# Patient Record
Sex: Female | Born: 1990 | Race: Black or African American | Hispanic: No | Marital: Single | Smoking: Current every day smoker
Health system: Southern US, Community
[De-identification: ages and names within clinical notes are randomized; demographics above are authoritative.]

## PROBLEM LIST (undated history)

## (undated) ENCOUNTER — Inpatient Hospital Stay (HOSPITAL_COMMUNITY): Payer: Self-pay

## (undated) DIAGNOSIS — IMO0001 Reserved for inherently not codable concepts without codable children: Secondary | ICD-10-CM

## (undated) DIAGNOSIS — O039 Complete or unspecified spontaneous abortion without complication: Secondary | ICD-10-CM

## (undated) DIAGNOSIS — B9689 Other specified bacterial agents as the cause of diseases classified elsewhere: Secondary | ICD-10-CM

## (undated) DIAGNOSIS — Z87442 Personal history of urinary calculi: Secondary | ICD-10-CM

## (undated) DIAGNOSIS — N760 Acute vaginitis: Secondary | ICD-10-CM

## (undated) DIAGNOSIS — B999 Unspecified infectious disease: Secondary | ICD-10-CM

## (undated) DIAGNOSIS — Z789 Other specified health status: Secondary | ICD-10-CM

## (undated) HISTORY — DX: Other specified health status: Z78.9

## (undated) HISTORY — PX: NO PAST SURGERIES: SHX2092

---
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
## 2009-02-07 ENCOUNTER — Emergency Department (HOSPITAL_COMMUNITY): Admission: EM | Admit: 2009-02-07 | Discharge: 2009-02-07 | Payer: Self-pay | Admitting: Emergency Medicine

## 2009-03-05 ENCOUNTER — Emergency Department (HOSPITAL_COMMUNITY): Admission: EM | Admit: 2009-03-05 | Discharge: 2009-03-05 | Payer: Self-pay | Admitting: Emergency Medicine

## 2009-03-07 ENCOUNTER — Emergency Department (HOSPITAL_COMMUNITY): Admission: EM | Admit: 2009-03-07 | Discharge: 2009-03-07 | Payer: Self-pay | Admitting: Emergency Medicine

## 2009-04-07 ENCOUNTER — Ambulatory Visit: Payer: Self-pay | Admitting: Family Medicine

## 2009-04-07 DIAGNOSIS — F17200 Nicotine dependence, unspecified, uncomplicated: Secondary | ICD-10-CM | POA: Insufficient documentation

## 2009-04-12 ENCOUNTER — Ambulatory Visit: Payer: Self-pay | Admitting: Family Medicine

## 2009-04-20 ENCOUNTER — Ambulatory Visit: Payer: Self-pay | Admitting: Family Medicine

## 2009-04-20 DIAGNOSIS — B373 Candidiasis of vulva and vagina: Secondary | ICD-10-CM

## 2009-04-20 DIAGNOSIS — B3731 Acute candidiasis of vulva and vagina: Secondary | ICD-10-CM | POA: Insufficient documentation

## 2009-04-20 DIAGNOSIS — N898 Other specified noninflammatory disorders of vagina: Secondary | ICD-10-CM | POA: Insufficient documentation

## 2009-04-20 LAB — CONVERTED CEMR LAB: Whiff Test: NEGATIVE

## 2009-04-26 ENCOUNTER — Telehealth: Payer: Self-pay | Admitting: Family Medicine

## 2009-07-28 ENCOUNTER — Telehealth: Payer: Self-pay | Admitting: Family Medicine

## 2009-08-08 ENCOUNTER — Ambulatory Visit: Payer: Self-pay | Admitting: Family Medicine

## 2009-08-08 ENCOUNTER — Encounter: Payer: Self-pay | Admitting: Family Medicine

## 2009-08-08 LAB — CONVERTED CEMR LAB: Beta hcg, urine, semiquantitative: POSITIVE

## 2009-08-09 LAB — CONVERTED CEMR LAB
Antibody Screen: NEGATIVE
Basophils Relative: 0 % (ref 0–1)
Eosinophils Absolute: 0 10*3/uL (ref 0.0–0.7)
Eosinophils Relative: 1 % (ref 0–5)
Hemoglobin: 11.4 g/dL — ABNORMAL LOW (ref 12.0–15.0)
Hepatitis B Surface Ag: NEGATIVE
Lymphocytes Relative: 20 % (ref 12–46)
Lymphs Abs: 1.6 10*3/uL (ref 0.7–4.0)
MCV: 84.1 fL (ref 78.0–100.0)
RBC: 4.08 M/uL (ref 3.87–5.11)
Rh Type: POSITIVE
Rubella: 42 intl units/mL — ABNORMAL HIGH
Sickle Cell Screen: NEGATIVE
WBC: 8.2 10*3/uL (ref 4.0–10.5)

## 2009-08-12 ENCOUNTER — Inpatient Hospital Stay (HOSPITAL_COMMUNITY): Admission: AD | Admit: 2009-08-12 | Discharge: 2009-08-12 | Payer: Self-pay | Admitting: Obstetrics & Gynecology

## 2009-08-12 ENCOUNTER — Ambulatory Visit: Payer: Self-pay | Admitting: Family

## 2009-08-15 ENCOUNTER — Telehealth (INDEPENDENT_AMBULATORY_CARE_PROVIDER_SITE_OTHER): Payer: Self-pay | Admitting: *Deleted

## 2009-08-16 ENCOUNTER — Telehealth: Payer: Self-pay | Admitting: Family Medicine

## 2009-08-16 ENCOUNTER — Ambulatory Visit: Payer: Self-pay | Admitting: Family Medicine

## 2009-08-16 DIAGNOSIS — A5400 Gonococcal infection of lower genitourinary tract, unspecified: Secondary | ICD-10-CM | POA: Insufficient documentation

## 2009-08-17 ENCOUNTER — Ambulatory Visit: Payer: Self-pay | Admitting: Family Medicine

## 2009-08-22 ENCOUNTER — Telehealth: Payer: Self-pay | Admitting: Family Medicine

## 2009-08-29 ENCOUNTER — Encounter: Payer: Self-pay | Admitting: Family Medicine

## 2009-08-29 ENCOUNTER — Ambulatory Visit: Payer: Self-pay | Admitting: Family Medicine

## 2009-08-29 ENCOUNTER — Other Ambulatory Visit: Admission: RE | Admit: 2009-08-29 | Discharge: 2009-08-29 | Payer: Self-pay | Admitting: Family Medicine

## 2009-08-29 LAB — CONVERTED CEMR LAB
Chlamydia, DNA Probe: NEGATIVE
GC Probe Amp, Genital: NEGATIVE
Protein, U semiquant: NEGATIVE
Specific Gravity, Urine: 1.015
WBC Urine, dipstick: NEGATIVE

## 2009-08-30 ENCOUNTER — Encounter: Payer: Self-pay | Admitting: Family Medicine

## 2009-09-26 ENCOUNTER — Ambulatory Visit: Payer: Self-pay | Admitting: Family Medicine

## 2009-09-26 ENCOUNTER — Encounter: Payer: Self-pay | Admitting: *Deleted

## 2009-09-26 ENCOUNTER — Encounter: Payer: Self-pay | Admitting: Family Medicine

## 2009-09-26 DIAGNOSIS — F1220 Cannabis dependence, uncomplicated: Secondary | ICD-10-CM | POA: Insufficient documentation

## 2009-09-27 ENCOUNTER — Encounter: Payer: Self-pay | Admitting: *Deleted

## 2009-09-30 ENCOUNTER — Encounter: Payer: Self-pay | Admitting: *Deleted

## 2009-10-21 ENCOUNTER — Telehealth: Payer: Self-pay | Admitting: *Deleted

## 2009-11-02 ENCOUNTER — Ambulatory Visit: Payer: Self-pay | Admitting: Family Medicine

## 2009-11-07 ENCOUNTER — Encounter: Payer: Self-pay | Admitting: Family Medicine

## 2009-11-09 ENCOUNTER — Encounter: Payer: Self-pay | Admitting: Family Medicine

## 2009-11-09 ENCOUNTER — Ambulatory Visit (HOSPITAL_COMMUNITY): Admission: RE | Admit: 2009-11-09 | Discharge: 2009-11-09 | Payer: Self-pay | Admitting: Family Medicine

## 2009-11-11 ENCOUNTER — Ambulatory Visit: Payer: Self-pay | Admitting: Family Medicine

## 2009-12-05 ENCOUNTER — Ambulatory Visit: Payer: Self-pay | Admitting: Family Medicine

## 2009-12-22 ENCOUNTER — Ambulatory Visit: Payer: Self-pay | Admitting: Family Medicine

## 2009-12-22 ENCOUNTER — Encounter: Payer: Self-pay | Admitting: Family Medicine

## 2009-12-23 ENCOUNTER — Encounter: Payer: Self-pay | Admitting: Family Medicine

## 2010-01-03 ENCOUNTER — Ambulatory Visit: Payer: Self-pay | Admitting: Family Medicine

## 2010-02-07 ENCOUNTER — Ambulatory Visit: Payer: Self-pay | Admitting: Family Medicine

## 2010-02-07 ENCOUNTER — Encounter: Payer: Self-pay | Admitting: Family Medicine

## 2010-02-07 LAB — CONVERTED CEMR LAB
HCT: 34.7 % — ABNORMAL LOW (ref 36.0–46.0)
Hemoglobin: 11.1 g/dL — ABNORMAL LOW (ref 12.0–15.0)
MCHC: 32 g/dL (ref 30.0–36.0)
MCV: 87.8 fL (ref 78.0–100.0)
Platelets: 178 10*3/uL (ref 150–400)
RBC: 3.95 M/uL (ref 3.87–5.11)
RDW: 13.3 % (ref 11.5–15.5)

## 2010-02-22 ENCOUNTER — Ambulatory Visit: Payer: Self-pay | Admitting: Family Medicine

## 2010-02-22 DIAGNOSIS — M25559 Pain in unspecified hip: Secondary | ICD-10-CM

## 2010-02-22 HISTORY — DX: Pain in unspecified hip: M25.559

## 2010-02-25 ENCOUNTER — Inpatient Hospital Stay (HOSPITAL_COMMUNITY): Admission: AD | Admit: 2010-02-25 | Discharge: 2010-02-25 | Payer: Self-pay | Admitting: Obstetrics and Gynecology

## 2010-03-09 ENCOUNTER — Ambulatory Visit: Payer: Self-pay | Admitting: Family Medicine

## 2010-03-09 ENCOUNTER — Encounter: Payer: Self-pay | Admitting: Family Medicine

## 2010-03-10 ENCOUNTER — Encounter: Payer: Self-pay | Admitting: Family Medicine

## 2010-03-13 ENCOUNTER — Encounter: Payer: Self-pay | Admitting: Family Medicine

## 2010-03-16 ENCOUNTER — Ambulatory Visit: Payer: Self-pay | Admitting: Family Medicine

## 2010-03-31 ENCOUNTER — Inpatient Hospital Stay (HOSPITAL_COMMUNITY): Admission: AD | Admit: 2010-03-31 | Discharge: 2010-03-31 | Payer: Self-pay | Admitting: Family Medicine

## 2010-03-31 DIAGNOSIS — O479 False labor, unspecified: Secondary | ICD-10-CM

## 2010-04-03 ENCOUNTER — Ambulatory Visit: Payer: Self-pay | Admitting: Family Medicine

## 2010-04-04 ENCOUNTER — Ambulatory Visit: Payer: Self-pay | Admitting: Advanced Practice Midwife

## 2010-04-04 ENCOUNTER — Inpatient Hospital Stay (HOSPITAL_COMMUNITY): Admission: AD | Admit: 2010-04-04 | Discharge: 2010-04-07 | Payer: Self-pay | Admitting: Obstetrics & Gynecology

## 2010-05-10 ENCOUNTER — Ambulatory Visit: Payer: Self-pay | Admitting: Family Medicine

## 2010-05-30 ENCOUNTER — Encounter: Payer: Self-pay | Admitting: Family Medicine

## 2010-05-30 LAB — CONVERTED CEMR LAB
Chlamydia, DNA Probe: NEGATIVE
Whiff Test: POSITIVE

## 2010-06-02 ENCOUNTER — Encounter: Payer: Self-pay | Admitting: Family Medicine

## 2010-06-18 ENCOUNTER — Encounter: Payer: Self-pay | Admitting: Family Medicine

## 2010-06-27 NOTE — Progress Notes (Signed)
Summary: Triage  Phone Note Call from Patient Call back at Home Phone [phone]   Caller: Patient Summary of Call: Pt period is late and had unprotected sex yesterday.  Should she do the plan B? Initial call taken by: Clydell Hakim,  July 28, 2009 8:57 AM  Follow-up for Phone Call        states she did not use nuvaring this month. told her to get plan B now & take.  resume nuvaring Follow-up by: Golden Circle RN,  July 28, 2009 9:04 AM

## 2010-06-27 NOTE — Letter (Signed)
Summary: Generic Letter  [facility]  [address]   [place], [place] [address]   Phone: [phone]  Fax: [phone]    09/26/2009  [name] [address] [place], [place]  [address]  Dear Shannon Randolph,  To whom it may concern;  Shannon Randolph is currently pregnant with a gestational due date of 04/06/2010. If there are any questions, please feel free to call [facility].          Sincerely,   Doree Albee MD

## 2010-06-27 NOTE — Letter (Signed)
Summary: Handout Printed  Printed Handout:  - Prenatal-Record-CCC 

## 2010-06-27 NOTE — Miscellaneous (Signed)
Summary: integrated screening/ts  Clinical Lists Changes faxed holisters, order and prior authorization for integrated screening to [phone] [place] # [id number]. pt is aware of appt.Arlyss Repress CMA,  Sep 26, 2009 3:49 PM (see under order)

## 2010-06-27 NOTE — Progress Notes (Signed)
Summary: phn msg  Phone Note Call from Patient Call back at [facility] Phone [phone]   Caller: Patient Summary of Call: Pt wants a 3D ultrasound.  Do we do those here or can she be scheduled to have it somewhere where they do them. Initial call taken by: Clydell Hakim,  Oct 21, 2009 9:19 AM  Follow-up for Phone Call        Informed pt that we do not schedule 3D ultrasounds and that medicaid does not cover them. Gave her the number to [facility] to schedule this herself if she want to pay out of pocket for this. Follow-up by: Garen Grams LPN,  Oct 21, 2009 9:39 AM

## 2010-06-27 NOTE — Assessment & Plan Note (Signed)
Summary: NOB/DSL   Vital Signs:  Patient profile:   20 year old female Height:      63 inches Weight:      131 pounds Pulse rate:   99 / minute BP sitting:   111 / 68  (left arm) Cuff size:   regular  Vitals Entered By: Tessie Fass CMA (August 29, 2009 1:49 PM) CC: new ob LMP - Character: normal LMP - Reliable? Yes Menarche (age onset years): 13   Menses interval (days): 25 Menstrual flow (days): 5 On BCP's at conception: no   CC:  new ob.  Allergies: No Known Drug Allergies  Social History: lives with mom, 3 brothers, and 1 sister (pt is the oldest); 2 twin nieces just born; smokes 1/4 PPD; occasional alcohol. Occasional marijuana use.  Occupation:  retail  Education:  12th grade   Impression & Recommendations:  Problem # 1:  SUPERVISION OF OTHER NORMAL PREGNANCY (ICD-V22.1)  20 YO G2P1010 here for new ob visit. Serologies: B+, Ab neg, RI, [place] neg, HIV NR, Hgb 11.4. GC/Cnl, Pap performed in clinic. Repeat urine cx performed 2/2 recent dx and tx for e. coli UTI. Addressed nausea and eating habits w/ pt in office. Smoking cessation (MJ)  discussed at length w/ pt in setting pt using prn MJ for subjective nausea. Pt aggreeable to cessation as welll as clinical resources. Otherwise pt w/o any other questions or concerns.   Orders: GC/Chlamydia-[facility] ([id number]) Urine Culture-[facility] ([id number]) Urinalysis-[facility] (00000) Medicaid OB visit - [facility] (09811)  Complete Medication List: 1)  Prenatal Vitamins 0.8 Mg Tabs (Prenatal multivit-min-fe-fa) .... One tab by mouth daily 2)  Keflex 500 Mg Caps (Cephalexin) .... One by mouth two times a day fo r7 days  Other Orders: Pap Smear-[facility] ([id number])  Patient Instructions: 1)  It was very good to meet you today!! 2)  Try eating smaller meals more frequently to help alleviate nausea. 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 No hx/o domestic/verbal abuse  Menstrual History    LMP (date): 06/30/2009    LMP - Character: normal    LMP - Reliable? : Yes    Menarche: 13 years    Menses interval: 25 days    Menstrual flow 5 days    On BCP's at conception: no    Symptoms since LMP: nausea, vomiting, irritability, urinary frequency    Past Pregnancy History    Gravida:     2    Term Births:     0    Premature Births:   0    Living Children:   0    Para:  1    Mult. Births:     0    Prev C-Section:   0    Aborta:     1    Elect. Ab:     1    Spont. Ab:     0    Ectopics:     0  Pregnancy # 1    Comments:     TAB @ 4 weeks-2009    Prenatal Education Provided: 1)  Prenatal Education packet given; please call if you have any questions. 2)  Prenatal Education provided today and information packet given. 3)  Stressed importance of taking folic acid and Prenatal vitamins. 4)  Hazards of smoking and pregnancy reviewed; smoking cessation strongly encouraged and smoking cessation techniques reviewed.     Flowsheet View for Follow-up Visit    Estimated weeks of       gestation:     8 4/7    Weight:     131    Blood pressure:   111 / 68    Urine protein:       negative    Urine glucose:    negative    Urine nitrite:     negative    Hx headache?     few    Nausea/vomiting?   freq    Edema?     0     Bleeding?     no    Leakage/discharge?   no    Fetal activity:       N/A    Labor symptoms?   no      Laboratory Results   Urine Tests  Date/Time Received: August 29, 2009 2:38 PM  Date/Time Reported: August 29, 2009 2:48 PM   Routine Urinalysis   Color: yellow Appearance: Clear Glucose: negative   (Normal Range: Negative) Bilirubin: negative   (Normal Range: Negative) Ketone: negative   (Normal Range: Negative) Spec. Gravity: 1.015   (Normal Range: 1.003-1.035) Blood: negative   (Normal Range: Negative) pH: 7.5   (Normal Range: 5.0-8.0) Protein: negative   (Normal Range: Negative) Urobilinogen: 0.2   (Normal Range: 0-1) Nitrite: negative   (Normal Range: Negative) Leukocyte Esterace: negative   (Normal Range: Negative)    Comments: ...............test performed by......Marland KitchenBonnie A. [place], MLS (ASCP)cm

## 2010-06-27 NOTE — Letter (Signed)
Summary: Handout Printed  Printed Handout:  - Prenatal-Flowsheet-CCC 

## 2010-06-27 NOTE — Assessment & Plan Note (Signed)
Summary: retreat std.vomited 1st dose/[place]/everhart  Nurse Visit   Allergies: No Known Drug Allergies  Medication Administration  Medication # 1:    Medication: Azithromycin oral    Diagnosis: GONORRHEA (ICD-098.0)    Dose: 1 gram    Route: po    Exp Date: 04/27/2010    Lot #: [id number]    Mfr: Pfizer    Comments: patient vomited treatment given yesterday 40 minutes after receiving. MD ordered to repeat treatment today    Patient tolerated medication without complications    Given by: Theresia Lo RN (August 17, 2009 12:54 PM)  Orders Added: 1)  Azithromycin oral [Q0144] 2)  Est Level 1- [facility] [45409]   Medication Administration  Medication # 1:    Medication: Azithromycin oral    Diagnosis: GONORRHEA (ICD-098.0)    Dose: 1 gram    Route: po    Exp Date: 04/27/2010    Lot #: [id number]    Mfr: Pfizer    Comments: patient vomited treatment given yesterday 40 minutes after receiving. MD ordered to repeat treatment today    Patient tolerated medication without complications    Given by: Theresia Lo RN (August 17, 2009 12:54 PM)  Orders Added: 1)  Azithromycin oral [Q0144] 2)  Est Level 1- [facility] [[id number]]   Appended Document: retreat std.vomited 1st dose/[place]/everhart communicable disease report faxed to [facility].

## 2010-06-27 NOTE — Assessment & Plan Note (Signed)
Summary: ob ck,df   Vital Signs:  Patient profile:   20 year old female Weight:      136 pounds Pulse rate:   70 / minute BP sitting:   113 / 71  Vitals Entered By: Arlyss Repress CMA, (December 05, 2009 2:57 PM) CC: ob f/up. vag d/c x 1 week. Is Patient Diabetic? No Pain Assessment Patient in pain? no        Primary Care Provider:  Doree Albee MD  CC:  ob f/up. vag d/c x 1 week.Marland Kitchen  History of Present Illness: 20 YO G1P0 at 65 4/7 by LMP w/ EDD 05/12/09. Pt denies adb pain, vaginal bleeding. Pt reports non-odorous vaginal discharge for last 1 week. No change in sexual partners. No fevers. No vaginal bleeding. Diet now adequate, eating regluar diet w/o nausea or vomitng. No HI/SI. Good social environment. Good relationship w/ FOB.    Habits & Providers  Alcohol-Tobacco-Diet     Tobacco Status: quit     Cigarette Packs/Day: quit MJ  Allergies: No Known Drug Allergies  Social History: Smoking Status:  quit  Physical Exam  General:  alert and well-developed.   Head:  normocephalic and atraumatic.   Eyes:  vision grossly intact.   Ears:  R ear normal and L ear normal.   Nose:  no external deformity.   Mouth:  good dentition.   Neck:  supple and full ROM.   Lungs:  CATB, no wheezes, rales, rhoncii, Heart:  RRR, no murmurs Abdomen:  gravid abdomen to 22.5 cm, + fetal heart tones to 140s Genitalia:  normal introitus and no external lesions. + vaginal dischargenormal introitus, no external lesions, mucosa pink and moist, and no vaginal or cervical lesions.   Extremities:  2+ peripheral pulses, no edema  Neurologic:  alert and oriented x3   Impression & Recommendations:  Problem # 1:  SUPERVISION OF OTHER NORMAL PREGNANCY (ICD-V22.1) Otherwise normal fetal development thus far. u/s reassuring for fetal growth.  Weight gain reassuring. No life stressors or depression red flags. MJ abuse resolved. Plan to send pt to [facility] clinic in 2 week. Pt also due for 1 hr gtt @ next  primary OB clinic visi(in 4 weeks).   Orders: [facility]- Est Level  3 (30160)  Problem # 2:  VAGINAL DISCHARGE (ICD-623.5) Plan to obtain wet prep to assess vaginal d/c in setting of + hx/o STDs.  [facility]  Complete Medication List: 1)  Prenatal Vitamins 0.8 Mg Tabs (Prenatal multivit-min-fe-fa) .... One tab by mouth daily 2)  Keflex 500 Mg Caps (Cephalexin) .... One by mouth two times a day fo r7 days  Patient Instructions: 1)  It was good seeing you today 2)  I will call you if the results of your wet prep are positive.  3)  Continue eating a regular diet.  4)  You will need to schedule an appt w/ OB clinic in about 2 weeks.  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 No hx/o domestic/verbal abuse  Menstrual History    LMP (date): 06/30/2009    LMP - Character: normal    Menarche: 13 years    Menses interval: 25 days    Menstrual flow 5 days    On BCP's at conception: no   Flowsheet View for Follow-up Visit    Estimated weeks of       gestation:     22 4/7    Weight:     136    Blood pressure:   113 / 71    Headache:     No    Nausea/vomiting:   No    Edema:     0    Vaginal bleeding:   no    Vaginal discharge:   d/c    Fundal height:      23.5    FHR:       140s    Fetal activity:     yes    Labor  symptoms:   no    Cx Dilation:     0    Cx Effacement:   0    Cx Station:     high    Taking prenatal vits?   Y    Smoking:     quit MJ    Next visit:     4 wk    Resident:     Alvester Morin    Preceptor:     Hensel    Laboratory Results  Date/Time Received: December 05, 2009 3:09 PM  Date/Time Reported: December 05, 2009 3:36 PM   [employer] Source: vaginal WBC/hpf: 5-10 Bacteria/hpf: 3+  Cocci Clue cells/hpf: moderate  Positive whiff Yeast/hpf: none Trichomonas/hpf: none Comments: rod bacteria also present ...........test performed by...........Marland KitchenTerese Door, CMA     Flowsheet View for Follow-up Visit    Estimated weeks of       gestation:     22 4/7    Weight:     136    Blood pressure:   113 / 71    Hx headache?     No    Nausea/vomiting?   No    Edema?     0    Bleeding?     no    Leakage/discharge?   d/c    Fetal activity:       yes    Labor symptoms?   no    Fundal height:      23.5    FHR:       140s    Cx dilation:     0    Cx effacement:   0    Fetal station:     high    Taking Vitamins?   Y    Smoking PPD:   quit MJ    Next visit:     4 wk    Resident:     Alvester Morin    Preceptor:     Leveda Anna

## 2010-06-27 NOTE — Assessment & Plan Note (Signed)
Summary: OB/KH   Vital Signs:  Patient profile:   20 year old female Weight:      142.3 pounds Temp:     98.2 degrees F oral Pulse rate:   102 / minute BP sitting:   105 / 66  Vitals Entered By: Garen Grams LPN (December 22, 2009 10:02 AM)  Allergies: No Known Drug Allergies   Impression & Recommendations:  Problem # 1:  PREGNANCY EXAMINATION OR TEST POSITIVE RESULT (ICD-V72.42) Patient is G2P0 with prior first trimester EAB, not P1 as previously documented.  Discussed this and confirmed history with patient today. Will also followup with repeat UCx in setting of prior asymptomatic bacteruria.  She is asymptomatic today, no dysuria or polyuria.  Measurements and FHR reassuring today.  Discussed weight gain as well (has gained at greater interval rate than would expect since last visit).  Not taking PNVs, says they are too large. WIll focus on folate and discussed with her today.  Orders: Medicaid OB visit - [facility] (99213)Future Orders: Glucose 1 hr-[facility] ([id number]) ... 12/13/2010  Problem # 2:  VAGINAL DISCHARGE (ICD-623.5) Copious vaginal discharge (milky), without CMT on bimanual exam.  Wet mount and cervical cx done today. No clue cells or hyphae.  Wait for cervical cultures before treating. High risk due to prior STIs. Orders: Urine Culture-[facility] ([id number]) GC/Chlamydia-[facility] ([id number]) [facility]  Complete Medication List: 1)  Folic Acid 1 Mg Tabs (Folic acid) .... Sig: take 1 tab by mouth one time daily disp 1 bottle  Patient Instructions: 1)  It was a pleasure to see you in the prenatal clinic today.  You  are at [redacted] weeks gestational age.  Your measurements and baby heart tones are good today.  2)  I collected a culture of your urine, also a cervical culture today.  3)  You are to follow up in 2 weeks with Dr Alvester Morin, and to have a 1hour glucose tolerance test at that time.  4)  I am sending a [facility]olic acid (vitamin[facility] [address]   Prescriptions: FOLIC ACID 1 MG TABS (FOLIC ACID) SIG: Take 1 tab by mouth one time daily DISP 1 bottle  #100 x 6   Entered and Authorized by:   Paula Compton MD   Signed by:   Paula Compton MD on 12/22/2009   Method used:   [facility]  [address] [id number]       [address]       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 No hx/o domestic/verbal abuse  Menstrual History    LMP (date): 06/30/2009    LMP - Character: normal    Menarche: 13 years    Menses interval: 25 days  Menstrual flow 5 days    On BCP's at conception: no   Flowsheet View for Follow-up Visit    Estimated weeks of       gestation:     25 0/7    Weight:     142.3    Blood pressure:   105 / 66    Headache:     No    Nausea/vomiting:   No    Edema:     0    Vaginal bleeding:   no    Vaginal discharge:   d/c    Fundal height:      25.5    FHR:       138    Fetal activity:     yes    Taking prenatal vits?   N    Next visit:     2 wk    Preceptor:     Mauricio Po    Comment:     see CPOE    Flowsheet View for Follow-up Visit    Estimated weeks of       gestation:     25 0/7    Weight:     142.3    Blood pressure:   105 / 66    Hx headache?     No    Nausea/vomiting?   No    Edema?     0    Bleeding?     no    Leakage/discharge?   d/c    Fetal activity:       yes    Fundal height:      25.5    FHR:       138    Taking Vitamins?   N    Comment:     see CPOE    Next visit:     2 wk     Preceptor:     Mauricio Po  Laboratory Results  Date/Time Received: December 22, 2009 10:40 AM  Date/Time Reported: December 22, 2009 10:45 AM   Wet Mount Source: vag WBC/hpf: >20 Bacteria/hpf: 3+  Rods Clue cells/hpf: none  Negative whiff Yeast/hpf: none Trichomonas/hpf: none Comments: many epthelials ...............test performed by......Marland KitchenBonnie A. 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 No hx/o domestic/verbal abuse  Menstrual History    LMP (date): 06/30/2009    LMP - Character: normal    Menarche: 13 years    Menses interval: 25 days    Menstrual flow 5 days    On BCP's at conception: no   Past Pregnancy History    Para:       0

## 2010-06-27 NOTE — Assessment & Plan Note (Signed)
Summary: OB Visit 33 6/7    Vital Signs:  Patient profile:   20 year old female Weight:      152 pounds BP sitting:   112 / 74  Vitals Entered By: Jone Baseman CMA (February 22, 2010 1:29 PM)  Allergies: No Known Drug Allergies  Physical Exam  General:  alert and well-developed.   Head:  normocephalic and atraumatic.   Eyes:  vision grossly intact.   Nose:  no external deformity.   Mouth:  good dentition.   Neck:  supple and full ROM.   Lungs:  normal respiratory effort, normal breath sounds, and no wheezes.   Heart:  normal rate, regular rhythm, and no murmur.   Abdomen:  gravid abdomen, measurements concordant w/ dates, adequate fetal heart tones. Extremities:  2+ perpheral pulses, no edema  Neurologic:  alert & oriented X3.     Impression & Recommendations:  Problem # 1:  PREGNANCY (ICD-V22.2)  Otherwise normal fetal development and growth to date. Previous abdominal fullnes/pressure has improved per pt. Generally is feeling tired- "im tired of being pregnant",but is excited about motherhood. No HI/SI. Relationship w/ FOB stable per pt. Will plan for GC/Chl and GBS at 36 week check. PTL red flags re-reviewed.     Birthcontrol: undecided Breastfeeding: yes Baby: Female   Orders: [facility]- Est Level  3 (29562)  Problem # 2:  HIP PAIN, LEFT (ICD-719.45) Assessment: New  Hip Pain: Pt reports L hip and back pain for the last 4-5 days. No dysuria, flank pain, nausea, vomiting. Pain worse with standing and has not used anything for pain. Overall etiology secondary hip pain form pregnancy vs. trochanteric bursitis. Pt instructed to use as needed tylenol ofr pain. If pain persistent into next clinical visit would consider therapeutic trochanteric injection. Case reviewed w/ Dr. Jennette Kettle. Pt agreeable to plan w/ hip pain red flags reviewed for return.   Orders: [facility]- Est Level  3 (13086)  Complete Medication List: 1)  Folic Acid 1 Mg Tabs (Folic acid) .... Sig: take 1 tab by  mouth one time daily disp 1 bottle  Patient Instructions: 1)   It was good to see you today 2)  Your baby is doing great! 3)  If your hip pain worsens at your next visit we may give you a shot for it.  4)  If you have any severe abdominal pain, vaginal bleeding, or sudden gush of fluid, call the [facility] or go to [facility]  5)  Otherwise come back to see me in 2 weeks 6)  Call for any other questions 7)  God Bless,  8)  Doree Albee MD    Flowsheet View for Follow-up Visit    Estimated weeks of       gestation:     33 6/7    Weight:     152    Blood pressure:   112 / 74    Hx headache?     No    Nausea/vomiting?   No    Edema?     0    Bleeding?     no    Leakage/discharge?   no    Fetal activity:       yes    Labor symptoms?   no    Fundal height:      34.0    FHR:       140s    Taking Vitamins?   Y    Next visit:  2 wk    Resident:     Alvester Morin     Preceptor:     neal

## 2010-06-27 NOTE — Assessment & Plan Note (Signed)
Summary: OB Visit @ 37 weeks    Vital Signs:  Patient profile:   20 year old female Height:      63 inches Weight:      156.6 pounds BMI:     27.84 Temp:     99 degrees F Pulse rate:   103 / minute BP sitting:   117 / 69  (left arm)  Vitals Entered By: Theresia Lo RN (March 16, 2010 2:40 PM) CC: OB follow up Is Patient Diabetic? No Pain Assessment Patient in pain? yes     Location: low abdomen Intensity: 9 Type: pressure   CC:  OB follow up.  Habits & Providers  Alcohol-Tobacco-Diet     Tobacco Status: quit     Cigarette Packs/Day: rare  Allergies: No Known Drug Allergies  Physical Exam  General:  alert and well-developed.   Head:  normocephalic and atraumatic.   Eyes:  vision grossly intact.   Mouth:  good dentition.   Neck:  supple and full ROM.   Lungs:  normal respiratory effort.   Heart:  normal rate, regular rhythm, and no murmur.   Abdomen:  gravid abdomen. see flowsheet for full details  Genitalia:  no external lesions, + vaginal discharge,  SVE: FT/thick/high  Extremities:  no edema Neurologic:  alert & oriented X3.     Impression & Recommendations:  Problem # 1:  PREGNANCY (ICD-V22.2) Otherwise doing well. Adequate weight gain and fetal development to date. PTL and fetal movement precautions reviewed.  3rd trimester vaginal cultures reviewed w/ pt.  Reinforced need to stop smoking-addressed risk of smoking on baby w/ mom extensively. Mom agrreable to discontinuation.See flowsheet for full details .Plan to followup in 1 week.  Breastfeeding Still desiring nuva-ring Baby: Female  Orders: [facility]- Est Level  3 (47654)  Complete Medication List: 1)  Folic Acid 1 Mg Tabs (Folic acid) .... Sig: take 1 tab by mouth one time daily disp 1 bottle  Patient Instructions: 1)  It was good to see you today 2)  If you have vaginal bleeding, or if the baby is not moving well, or if your water breaks, or if you have regular contractions, please [facility]. 3)  STOP smoking 4)  Otrherwise come back to see me in 1 week 5)  Call if you have any questions 6)  God Bless 7)  Doree Albee MD   Orders Added: 1)  [facility]- Est Level  3 [65035]     Flowsheet View for Follow-up Visit    Estimated weeks of       gestation:     37 0/7    Weight:     156.6    Blood pressure:   117 / 69    Hx headache?     No    Nausea/vomiting?   nausea    Edema?     0    Bleeding?     no    Leakage/discharge?   no    Fetal activity:       yes    Labor symptoms?   few ctx    Fundal height:      37.5    FHR:       140s    Fetal position:      vertex    Cx dilation:     FT    Cx effacement:   0    Fetal station:     high    Taking  Vitamins?   Y    Smoking PPD:   rare    Next visit:     1 wk    Resident:     Alvester Morin

## 2010-06-27 NOTE — Miscellaneous (Signed)
Summary: chart update -- GxPx  Please note that the first time I met the patient she reported that she was G3P0030; this would make here now G4P0030 with this pregnancy  Clinical Lists Changes

## 2010-06-27 NOTE — Assessment & Plan Note (Signed)
Summary: wt ck,tcb  Nurse Visit weight check today. will forward to MD. Theresia Lo RN  November 11, 2009 9:47 AM  Vital Signs:  Patient profile:   20 year old female Weight:      133.7 pounds  Vitals Entered By: Theresia Lo RN (November 11, 2009 9:46 AM)  Allergies: No Known Drug Allergies  Orders Added: 1)  No Charge Patient Arrived (NCPA0) [NCPA0]

## 2010-06-27 NOTE — Assessment & Plan Note (Signed)
Summary: ob ck,df   Vital Signs:  Patient profile:   20 year old female Height:      63 inches Weight:      132 pounds BMI:     23.47 Pulse rate:   88 / minute BP sitting:   126 / 60  (right arm) Cuff size:   regular  Vitals Entered By: Tessie Fass CMA (November 02, 2009 4:04 PM) CC: OB Visit LMP (date): 06/30/2009 EDC by LMP==> 04/06/2010   CC:  OB Visit.  History of Present Illness: 11 G1P0 here at 75 6/7 weeks w/ EDD 04/06/2010 by LMP here for routine prenatal visit. Pt denies abd pain, vaginal bleeding, vaginal discharge. Pt reports resolution of previous nausea/vomiting. Now eating regular meals for last 2-3 weeks per pt. No depressive type sxs, no SI/HI.  No reports of abuse.  Habits & Providers  Alcohol-Tobacco-Diet     Cigarette Packs/Day: quit MJ  Allergies: No Known Drug Allergies  Physical Exam  General:  alert, well-nourished, and normal appearance.   Eyes:  vision grossly intact.   Mouth:  good dentition.   Neck:  supple and full ROM.   Lungs:  normal respiratory effort.   Heart:  normal rate and regular rhythm.   Abdomen:  gravid abdomen   Impression & Recommendations:  Problem # 1:  SUPERVISION OF OTHER NORMAL PREGNANCY (ICD-V22.1) Plan to obtain weight check in 1 week to assess progress of weight gain. PHQ-9 score 2 in clinic.  Discussed w/ pt at length importance of nutrition in setting of initial 4 lb weight loss during 1st trimester as well as overall weight gain parameters during pregnancy. Plan to setup pt for anatomy u/s; otherwise plan to followup in 4 weeks pending outcome of weight check. 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 Again stressed importance of cessation to aid in overall health of baby. Plan to obtain UDS at next visit as pt 20-30 mins late for appt; moved to end end of clinic  schedule.   Complete Medication List: 1)  Prenatal Vitamins 0.8 Mg Tabs (Prenatal multivit-min-fe-fa) .... One tab by mouth daily 2)  Keflex 500 Mg Caps (Cephalexin) .... 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 No hx/o domestic/verbal abuse  Menstrual History    LMP (date): 06/30/2009    EDC by LMP: 04/06/2010    LMP - Character: normal    Menarche: 13 years    Menses interval: 25 days    Menstrual flow 5 days    On BCP's at conception: no   Flowsheet View for Follow-up Visit    Estimated weeks of       gestation:     17 6/7    Weight:     132    Blood pressure:   126 / 60  Nausea/vomiting:   No    Edema:     0    Vaginal bleeding:   no    Vaginal discharge:   no    Fundal height:      18.5    FHR:       130s-140s    Fetal activity:     no    Labor symptoms:   no    Taking prenatal vits?   Y    Smoking:     quit MJ    Next visit:     4 wk    Resident:     Alvester Morin    Preceptor:     Outside Preceptor

## 2010-06-27 NOTE — Progress Notes (Signed)
Summary: triage  Phone Note Call from Patient Call back at Home Phone [phone]   Caller: Patient Summary of Call: Pt given meds for std and stayed for 20 minutes, but 20 minutes after she left she threw the medicine up.  Does she need to retake meds? Initial call taken by: Clydell Hakim,  August 16, 2009 1:39 PM  Follow-up for Phone Call        told her she did need to come back & take again. appt on nurse schedule for am Follow-up by: Golden Circle RN,  August 16, 2009 1:42 PM  Additional Follow-up for Phone Call Additional follow up Details #1::        agree. obviously does not need rocephin again. Additional Follow-up by: Myrtie Soman  MD,  August 16, 2009 1:50 PM

## 2010-06-27 NOTE — Miscellaneous (Signed)
Summary: [place] approved  Clinical Lists Changes OB US approved #[id number].Golden Circle RN  Sep 27, 2009 1:47 PM

## 2010-06-27 NOTE — Progress Notes (Signed)
Summary: pos test  Phone Note Call from Patient Call back at Home Phone [phone]   Caller: Patient Summary of Call: [facility] said she was positive for Gonorrhea and needs to be treated Initial call taken by: De Nurse,  August 15, 2009 3:29 PM  Follow-up for Phone Call        spoke  with pt. Will come in today for treatment. Rocephin 250 mg IM and Azithro 1 g slurry Follow-up by: Myrtie Soman  MD,  August 16, 2009 8:50 AM  Additional Follow-up for Phone Call Additional follow up Details #1::        noted. Additional Follow-up by: Theresia Lo RN,  August 16, 2009 10:13 AM

## 2010-06-27 NOTE — Assessment & Plan Note (Signed)
Summary: OB VIsit @ 39 4/7 weeks    Vital Signs:  Patient profile:   20 year old female Height:      63 inches Weight:      161 pounds Pulse rate:   103 / minute BP sitting:   106 / 78  (left arm) Cuff size:   regular  Vitals Entered By: Tessie Fass CMA (April 03, 2010 11:07 AM) CC: OB Visit   CC:  OB Visit.  Habits & Providers  Alcohol-Tobacco-Diet     Cigarette Packs/Day: rare  Allergies: No Known Drug Allergies  Physical Exam  General:  alert and well-developed.   Head:  normocephalic and atraumatic.   Nose:  no external deformity.   Mouth:  good dentition.   Neck:  supple and full ROM.   Lungs:  normal respiratory effort.   Heart:  normal rate, regular rhythm, and no murmur.   Abdomen:  gravid abdomen: fundal height and fetal hear tones appropriate Extremities:  2+ peripheral pulses   Impression & Recommendations:  Problem # 1:  PREGNANCY (ICD-V22.2) Otherwise normal prenatal development. Pt reports LOF for last 2-3 days. Soaking through 3-4 pads daily. No fever, abd pain. +intermittent contraction type pain. Was seen at MAU on 11/4. Was 2/40/-3 at the time. Was not checked for ROM. Will resend to MAU for eval of possible ROM. Contacted OB resident via bat phone. If negative pt instructed to followup in 1 week and will set up for Bi-weekly NST/BPP and likely induction at 41 weeks. Case precepted with Dr. Sheffield Slider.  Pt agreeable to plan.   Complete Medication List: 1)  Folic Acid 1 Mg Tabs (Folic acid) .... 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 No hx/o domestic/verbal abuse  Menstrual History    LMP (date): 06/30/2009    LMP - Character: normal    Menarche: 13 years    Menses interval: 25 days    Menstrual flow 5 days    On BCP's at conception: no   Flowsheet View for Follow-up Visit    Estimated weeks of       gestation:     39 4/7    Weight:     161    Blood pressure:   106 / 78    Headache:     No    Nausea/vomiting:   No    Edema:     0    Vaginal bleeding:   no  Vaginal discharge:   leak?    Fundal height:      39.5    FHR:       150s    Fetal activity:     yes    Labor symptoms:   few ctx    Taking prenatal vits?   Y    Smoking:     rare    Next visit:     1 wk    Resident:     Alvester Morin    Appended Document: OB VIsit @ 39 4/7 weeks     Clinical Lists Changes  Orders: Added new Test order of [facility]- Est Level  3 (13086) - Signed

## 2010-06-27 NOTE — Assessment & Plan Note (Signed)
Summary: OB visit 27 weeks-1 hr glucola   Vital Signs:  Patient profile:   20 year old female Height:      63 inches Weight:      140 pounds BP sitting:   119 / 76  (right arm) Cuff size:   regular  Vitals Entered By: Tessie Fass CMA (January 03, 2010 2:10 PM) CC: OB visit   Primary Care Provider:  Doree Albee MD  CC:  OB visit.  History of Present Illness: + foul smelling discharge,   Allergies: No Known Drug Allergies  Physical Exam  General:  alert and well-developed.   Head:  normocephalic and atraumatic.   Eyes:  vision grossly intact.   Ears:  R ear normal and L ear normal.   Nose:  no external deformity.   Mouth:  good dentition.   Neck:  supple and full ROM.   Lungs:  CTAB, no wheezes, rales, rhoncii Heart:  RRR, no wheezes, rales, rhoncii Abdomen:  gravid abdomen, fundal height @ 27 cms, fetal heart rate 140s Extremities:  no edema   Impression & Recommendations:  Problem # 1:  SUPERVISION OF OTHER NORMAL PREGNANCY (ICD-V22.1) Otherwise normal prenatal development to date. Adequate diet and weight gain. No vaginal bleeding, abd pain. Pt does report foul smelling vaginal discharge. Pt states that she only took 2/7 days of flagyl for BV initially dxd 11/2009. Pt states d/c similar to prior d/c from 11/2009. Plan to refill flagyl rx. Stressed extensively w/ pt need to complete treatment course. Mood stabel per pt. 1 hr glucola today. Will followup in 4 weeks. Will obtain CBC, RPR, HIV at that time. PTL red flags reviewed w/ pt.  Baby: Breastfeeding, girl, still deciding birth control.   Orders: Glucose 1 hr-[facility] (82950) [facility]- Est Level  3 (16010)  Complete Medication List: 1)  Folic Acid 1 Mg Tabs (Folic acid) .... 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 No hx/o domestic/verbal abuse  Menstrual History    LMP (date): 06/30/2009    LMP - Character: normal    Menarche: 13 years    Menses interval: 25 days    Menstrual flow 5 days    On BCP's at conception: no   Flowsheet View for Follow-up Visit    Estimated weeks of       gestation:     26 5/7    Weight:     140    Blood pressure:   119 / 76    Fetal activity:     yes    Labor symptoms:   no

## 2010-06-27 NOTE — Assessment & Plan Note (Signed)
Summary: ob visit,tcb   Vital Signs:  Patient profile:   20 year old female Weight:      131.1 pounds BP sitting:   111 / 60  Habits & Providers  Alcohol-Tobacco-Diet     Cigarette Packs/Day: quit MJ  Allergies: No Known Drug Allergies  Social History: Packs/Day:  quit MJ   Impression & Recommendations: 20 YO G2P0010 here at 12 4/7 weeks for routine OB visit. No issues or concerns per pt. Reiterated need to follow previous regien of small meals, crackers in am, in addtition to taking vit B6 for nausea (which the pt has not been compliant with any since last visit). No weight loss since last clincal visit. UOP stable. Pt denies dysuria since last visit in setting of urine culture w/ 40K multiple bacteria. Currently w/ asymptomatic bacteruria. No vaginal bleeding, vaginal discharge or abdominal cramping. Plan to obtain integrated screen at pt's request in addition to planning tentative OB u/s @ 18-20 weeks.   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 Expressed importance of no MJ use insetting of smoking risk on baby. Pt expressed understanding and palns to continue abstinence from MJ.   Complete Medication List: 1)  Prenatal Vitamins 0.8 Mg Tabs (Prenatal multivit-min-fe-fa) .... One tab by mouth daily 2)  Keflex 500 Mg Caps (Cephalexin) .... One by mouth two times a day fo r7 days  Other Orders: Obstetric Referral (Obstetric) [facility]- Est Level  3 (16109) Future Orders: Ultrasound (Ultrasound) ... 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 No hx/o domestic/verbal  abuse  Menstrual History    LMP (date): 06/30/2009    EDC by LMP: 04/06/2010    [facility] Working EDC: 04/06/2010    LMP - Character: normal    Menarche: 13 years    Menses interval: 25 days    Menstrual flow 5 days    On BCP's at conception: no   Flowsheet View for Follow-up Visit    Estimated weeks of       gestation:     12 4/7    Weight:     131.1    Blood pressure:   111 / 60    Headache:     few    Nausea/vomiting:   freq    Edema:     0    Vaginal bleeding:   no    Vaginal discharge:   no    Fundal height:      13cm    FHR:       150s    Fetal activity:     no    Labor symptoms:   no    Taking prenatal vits?   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 No hx/o domestic/verbal abuse  Menstrual History    LMP (date): 06/30/2009    EDC by LMP: 04/06/2010    [facility] EDC: 04/06/2010    LMP - Character: normal    Menarche: 13 years    Menses interval: 25 days    Menstrual flow 5 days    On BCP's at conception: no  Prenatal Visit EDC Confirmation:    New working [facility]: 04/06/2010    EDC by LMP: 04/06/2010

## 2010-06-27 NOTE — Miscellaneous (Signed)
Summary: re: appt at [facility]  Clinical Lists Changes Marchelle Folks from [facility] called and reports, that the pt arrived 1 hour late for her appt for the integrated screening. They were not able to see the pt and unable to r/s appt, because  these tests can only be performed up to 13.6 weeks in pregnancy. Arlyss Repress CMA,  Sep 30, 2009 12:21 PM

## 2010-06-27 NOTE — Assessment & Plan Note (Signed)
Summary: OB visit 31 5/7 weeks   Vital Signs:  Patient profile:   20 year old female Weight:      148 pounds BP sitting:   114 / 71  Vitals Entered By: Arlyss Repress CMA, (February 07, 2010 10:35 AM)  Allergies: No Known Drug Allergies  Physical Exam  General:  alert and well-developed.   Head:  normocephalic and atraumatic.   Eyes:  vision grossly intact.   Nose:  no external deformity.   Mouth:  good dentition.   Neck:  supple and full ROM.   Lungs:  normal respiratory effort.   Abdomen:  gravid abdomen, fundal height appropriate, fetal heart tones adequate (see flowsheet for full details) Extremities:  2+ peripheral pulses, no edema Neurologic:  alert & oriented X3.   Psych:  good eye contact, not anxious appearing, and not depressed appearing.     Impression & Recommendations:  Problem # 1:  PREGNANCY (ICD-V22.2) 19 YO G2P1 (1EAB) here at 31.5 weeks for routine OB visit. Otherwise normal fetal development and growth to date. Stabel home enviroment. Adequate weight gain. See flowsheet for full details. Pt reports lower abdominal fullness/pressure w/ standing. No dysuria, foul smelling vaginal discharge, abdominal pain, normal BMs per pt-Likely secondary to growing uterus. Plan to obtain CBC, RPR, HIV. PTL reviewed w/ pt. Will followup in 2 weeks. Will plan for GC/Chl/GBS at 36 week visit.   Birth Control: undecided Baby: Female Breastfeeding: Yes  Complete Medication List: 1)  Folic Acid 1 Mg Tabs (Folic acid) .... Sig: take 1 tab by mouth one time daily disp 1 bottle  Other Orders: CBC-[facility] (16109) HIV-[facility] ([id number]) RPR-[facility] [phone])  Patient Instructions: 1)  It was good to see you today 2)  Your baby is doing great! 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 No hx/o domestic/verbal abuse  Menstrual History    LMP (date): 06/30/2009    LMP - Character: normal    Menarche: 13 years    Menses interval: 25 days    Menstrual flow 5 days    On BCP's at conception: no   Flowsheet View for Follow-up Visit    Estimated weeks of       gestation:     31 5/7    Weight:     148    Blood pressure:   114 / 71    Headache:     No    Nausea/vomiting:   nausea    Edema:     0    Vaginal bleeding:   no    Vaginal discharge:   d/c    Fundal height:  32.0    FHR:       140s    Labor symptoms:   no    Taking prenatal vits?   Y    Next visit:     2 wk    Resident:     Alvester Morin     Preceptor:     Jennette Kettle  ob f/up. pt c/o 'feeling constipated all the time, have a hard time sleeping' Arlyss Repress CMA,  February 07, 2010 10:53 AM    Appended Document: provider charges    Clinical Lists Changes  Orders: Added new Test order of [facility]- Est Level  3 (16109) - Signed

## 2010-06-27 NOTE — Assessment & Plan Note (Signed)
Summary: ob visit/pcp not avail/eo   Vital Signs:  Patient profile:   20 year old female Weight:      154.9 pounds BP sitting:   111 / 69  Vitals Entered By: Arlyss Repress CMA, (March 09, 2010 11:38 AM)  Habits & Providers  Alcohol-Tobacco-Diet     Cigarette Packs/Day: rare  Allergies: No Known Drug Allergies  Social History: Packs/Day:  rare  Physical Exam  Genitalia:  Nl external female.  Copius white, milky discharge from cervix.  No lesions noted.  GBS, CZ and GC swab done.     Impression & Recommendations:  Problem # 1:  PREGNANCY (ICD-V22.2) Doing well.  PTL and fetal movement precautions reviewed.  Third trimester cultures done today.  Flu shot today.   Plans to breast feed Considering Nuva Ring.  Reviewed other options since pt was using NuvaRing when got pregnant, but she was not using it consistently.  She feels she would be able to use more reliably after pregnancy. Orders: GC/Chlamydia-[facility] (87591/87491) Grp B Probe-[facility] (16109-60454) Medicaid OB visit - [facility] (09811)  Problem # 2:  TOBACCO ABUSE (ICD-305.1) Counseled re: complete cessation.  Pt considering.    Complete Medication List: 1)  Folic Acid 1 Mg Tabs (Folic acid) .... Sig: take 1 tab by mouth one time daily disp 1 bottle  Other Orders: Influenza Vaccine NON MCR (91478)  Patient Instructions: 1)  Please come back in 1 week to see Dr. Alvester Morin. 2)  If you have vaginal bleeding, or if the baby is not moving well, or if your water breaks, or if you have regular contractions, [facility] 3)  Please consider stopping all cigarettes. 4)  Good luck!   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 No hx/o domestic/verbal abuse  Menstrual History    LMP (date): 06/30/2009    LMP - Character: normal    Menarche: 13 years    Menses interval: 25 days    Menstrual flow 5 days    On BCP's at conception: no   Flowsheet View for Follow-up Visit    Estimated weeks of       gestation:     36 0/7    Weight:     154.9    Blood pressure:   111 / 69    Headache:     few    Nausea/vomiting:   nausea    Edema:     0    Vaginal bleeding:   no    Vaginal discharge:   d/c    Fundal height:      37    FHR:       140s    Fetal activity:     yes    Cx Station:     37    Taking prenatal vits?   Y    Smoking:     rare    Next visit:     1 wk    Preceptor:     [place]  Prenatal Visit Concerns noted: Smoking a cigarette every few days.  No marijuana.  Just feels like she gets the taste for a cigarette every once in awhile.    Influenza Vaccine    Vaccine Type: Fluvax Non-MCR    Site: right deltoid    Mfr: GlaxoSmithKline    Dose: 0.5 ml    Route: IM    Given by: Arlyss Repress CMA,    Exp. Date: 11/22/2010    Lot #: [id number]    VIS given: 12/20/09 version given March 09, 2010.  Flu Vaccine Consent Questions    Do you have a history of severe allergic reactions to this vaccine? no    Any prior history of allergic reactions to egg and/or gelatin? no    Do you have a sensitivity to the preservative Thimersol? no    Do you have a past history of Guillan-Barre Syndrome? no    Do you currently have an acute febrile illness? no    Have you ever had a severe reaction to latex? no    Vaccine information given and explained to patient? yes    Are you currently pregnant? yes

## 2010-06-27 NOTE — Assessment & Plan Note (Signed)
Summary: STD treatment/eo  Nurse Visit   Allergies: No Known Drug Allergies  Medication Administration  Injection # 1:    Medication: Rocephin  250mg     Diagnosis: GONORRHEA (ICD-098.0)    Route: IM    Site: LUOQ gluteus    Exp Date: 11/2011    Lot #: [id number]    Mfr: Sandoz    Patient tolerated injection without complications    Given by: Theresia Lo RN (August 16, 2009 11:08 AM)  Medication # 1:    Medication: Azithromycin oral    Diagnosis: GONORRHEA (ICD-098.0)    Dose: 1 gram    Route: po    Exp Date: 04/27/2010    Lot #: [id number]    Mfr: Pfizer    Patient tolerated medication without complications    Given by: Theresia Lo RN (August 16, 2009 11:10 AM)  Orders Added: 1)  Rocephin  250mg  [J0696] 2)  Azithromycin oral [Q0144] 3)  Admin of Injection (IM/SQ) [64403]   Medication Administration  Injection # 1:    Medication: Rocephin  250mg     Diagnosis: GONORRHEA (ICD-098.0)    Route: IM    Site: LUOQ gluteus    Exp Date: 11/2011    Lot #: [id number]    Mfr: Sandoz    Patient tolerated injection without complications    Given by: Theresia Lo RN (August 16, 2009 11:08 AM)  Medication # 1:    Medication: Azithromycin oral    Diagnosis: GONORRHEA (ICD-098.0)    Dose: 1 gram    Route: po    Exp Date: 04/27/2010    Lot #: [id number]    Mfr: Pfizer    Patient tolerated medication without complications    Given by: Theresia Lo RN (August 16, 2009 11:10 AM)  Orders Added: 1)  Rocephin  250mg  [J0696] 2)  Azithromycin oral [Q0144] 3)  Admin of Injection (IM/SQ) [43329]  patient waited in office 20 minutes without any complications. advised to tell partner to be treated and abstain from sex for 7 days. always use condoms to prevent STD.n Theresia Lo RN  August 16, 2009 11:27 AM

## 2010-06-27 NOTE — Progress Notes (Signed)
Summary: phn msg  Phone Note Call from Patient Call back at Home Phone [phone]   Caller: Patient Summary of Call: pt forgot and had sex with her boyfriend this weekend after being treated for STD.  pls advise Initial call taken by: De Nurse,  August 22, 2009 8:32 AM  Follow-up for Phone Call        she was treated 5 days ago. needs to know if she needs re-treatment. boyfiend was treated 4 days ago. told her I will call her when md responds to this question Follow-up by: Golden Circle RN,  August 22, 2009 8:37 AM  Additional Follow-up for Phone Call Additional follow up Details #1::        as long as they both were treated, no problem. Needs to use a condom. Will do a test of cure later per Dr. Alvester Morin. please call pt to advise. thanks.  Additional Follow-up by: Myrtie Soman  MD,  August 22, 2009 8:41 AM    Additional Follow-up for Phone Call Additional follow up Details #2::    ptadvised Follow-up by: Golden Circle RN,  August 22, 2009 9:36 AM

## 2010-06-27 NOTE — Assessment & Plan Note (Signed)
Summary: preg test,df   Vital Signs:  Patient profile:   20 year old female Weight:      135.4 pounds Temp:     98.4 degrees F oral Pulse rate:   116 / minute Pulse rhythm:   regular BP sitting:   118 / 79  (left arm) Cuff size:   regular  Vitals Entered By: Loralee Pacas CMA  (August 08, 2009 9:32 AM)  History of Present Illness: 1. ? pregnant Pt reports that her LMP was 06/30/09. She is always very regular with her periods and that they come on the 1st, 2nd or 3rd of every month. Is late and has had unprotected sex several times and thinks she may be pregnant. States that it wouldn't be good news to find out she's pregnant but that she would likely keep the baby. Pt is a [id number]  ROS: reports breast tenderness, urinary frequency, increased irritability and some nausea especially in the morning for the past 1-2 weeks.  **of note pt is concerned that she did have unprotected sex with an ex-BF, not her current BF 07/27/09 (LMP 06/30/09). Does not want to have a child with the ex and wants to know how sure she can be that her ex-BF is not the father.   Current Medications (verified): 1)  Prenatal Vitamins 0.8 Mg Tabs (Prenatal Multivit-Min-Fe-Fa) .... One Tab By Mouth Daily  Allergies (verified): No Known Drug Allergies  Review of Systems       review of systems as noted in HPI section   Physical Exam  General:  vital signs reviewed and normal except for tachycardia Alert, appropriate; well-dressed and well-nourished  Lungs:  work of breathing unlabored, clear to auscultation bilaterally; no wheezes, rales, or ronchi; good air movement throughout  Heart:  regular rate and rhythm, no murmurs; normal s1/s2  Abdomen:  +BS, soft, non-tender, non-distended; no masses; no rebound or guarding; not obviously gravid.  Skin:  warm, good turgor; no rashes or lesions.  Psych:  alert and oriented. full affect, normally interactive. Good eye contact.    Impression & Recommendations:  Problem #  1:  PREGNANCY EXAMINATION OR TEST POSITIVE RESULT (ICD-V72.42) Will order prenatal labs today. Pt anticipates keeping the pregnancy. I counseled the patient that given her reliable LMPs she is likely 5 weeks 4 days pregnant, which would make the time of fertilization about 3 weeks and 4 days ago. Based upon this information, her unprotected sex with her ex-BF is less likely than her actual BF to be responsible for this pregnancy, however there is no way to tell at this time for certain. Advised that she should discuss this information with her current BF to prevent problems down the road. Pt expresses understanding. I will plan on doing initial OB visit and then turning over care to another resident as I will not be at [facility] at the time of delivery -- pt is aware of this.  Advised pt to stop smoking and to not use any alcohol -- she is agreeable to this. Orders: Prenatal-[facility] ([id number]) Urine Culture-[facility] ([id number]) HIV-[facility] ([id number]) Sickle Cell Scr-[facility] ([id number]) [facility]- Est Level  3 (99242)  Complete Medication List: 1)  Prenatal Vitamins 0.8 Mg Tabs (Prenatal multivit-min-fe-fa) .... One tab by mouth daily  Other Orders: U Preg-[facility] (68341)  Patient Instructions: 1)  You are pregnant. Based upon your last period you are 5 weeks and 4 days along. 2)  This is big news so take some time to think about all that this means.  3)  I'll be happy to start your prenatal care, but unfortunately, I won't be here with the baby is due. 4)  follow-up with me in 3-4 weeks for your first OB appointment. 5)  It's important to stop smoking now.  Prescriptions: PRENATAL VITAMINS 0.8 MG TABS (PRENATAL MULTIVIT-MIN-FE-FA) one tab by mouth daily  #100 x 11   Entered and Authorized by:   Myrtie Soman  MD   Signed by:   Myrtie Soman  MD on 08/08/2009   Method used:   Electronically to        [facility]  [address] [id number]       [address]       [place], [place]   [address]       Ph: [phone] or [phone]       Fax: [phone]   RxID:   [id number]   Laboratory Results   Urine Tests  Date/Time Received: August 08, 2009 9:38 AM  Date/Time Reported: August 08, 2009 9:44 AM     Urine HCG: positive Comments: ...............test performed by......Marland KitchenBonnie A. [place], MLS (ASCP)cm

## 2010-06-29 NOTE — Assessment & Plan Note (Signed)
Summary: postpartum followup   Vital Signs:  Patient profile:   20 year old female Weight:      140 pounds Pulse rate:   81 / minute BP sitting:   117 / 74  (right arm)  Vitals Entered By: Renato Battles slade,cma CC: PP check. discuss birth control. does not want pills, depo shot or implanon. interested in IUD or Novaring. Is Patient Diabetic? No Pain Assessment Patient in pain? no        Primary Care Provider:  Doree Albee MD  CC:  PP check. discuss birth control. does not want pills and depo shot or implanon. interested in IUD or Novaring.Marland Kitchen  History of Present Illness: 8 YOG1P1 here for postpartum followup. Pt delivered daughter 04/08/11 via NSVD. Baby bottlefeeding. No postpartum depression.  No abd pain, , vaginal bleeding resolved. Urinating and BMs at baseline. No trouble eating. FOB has been very helpful with the baby. Has had some scant d/c over last week; nonfoul smelling. Also recently had unprotected sex.  Birth Control: No birth control at [facility] discharge. Has had unprotected sex 4-5 days ago. Took Plan B out of concern for pregnancy. Is interested in IUD.   Habits & Providers  Alcohol-Tobacco-Diet     Tobacco Status: quit > 6 months     Tobacco Counseling: to quit use of tobacco products  Comments: intermittently smokes MJ  Allergies: No Known Drug Allergies  Social History: Smoking Status:  quit > 6 months  Physical Exam  General:  alert and well-developed.   Mouth:  good dentition.   Neck:  supple and full ROM.   Lungs:  CTAB, no wheezes, rales, rhoncii Heart:  RRR Abdomen:  S/NT/+ bowel sounds  Genitalia:  normal introitus and no external lesions.     Impression & Recommendations:  Problem # 1:  ROUTINE POSTPARTUM FOLLOW-UP (ICD-V24.2)  Otherwise normal postpartum followup.  Eating and drinking at baseline. No abd pain. Mood stable. Will obtain GC/Chl as pt w/ recent sexual activity and scant dicscharge. Also discussed birth control options. Pt  agreeable to depo shot and follow up IUD placement in 6-8 weeks. Encouraged pt to avoid unprotected  sex give concern for d/c and recent plan B use.   Orders: [facility]- Est Level  3 (16109)  Problem # 2:  TOBACCO ABUSE (ICD-305.1) Resolved, but now with intermittent MJ use. Congratulated pr on quitting cigarettes. Stressed imprtance of MJ avoidance with infant. Mom expressed understanding and is agreeable to plan.   Complete Medication List: 1)  Folic Acid 1 Mg Tabs (Folic acid) .... Sig: take 1 tab by mouth one time daily disp 1 bottle  Other Orders: U Preg-[facility] (60454) [facility]09811) GC/Chlamydia-[facility] (87591/87491) Depo-Provera 150mg  (B1478)  Patient Instructions: 1)  It was good to see you today  2)  We will give you the depo shot today 3)  Follow up with [place] in 6 weeks for [place] to discuss your IUD 4)  If you have any significant abd pain, nausea, vomiting, or any other concerning symptoms, please give [place] a call 5)  Happy New Year and God Bless, 6)  Doree Albee MD    Medication Administration  Injection # 1:    Medication: Depo-Provera 150mg     Diagnosis: CONTRACEPTIVE MANAGEMENT (ICD-V25.09)    Route: IM    Site: L deltoid    Exp Date: 09/2012    Lot #: [id number]    Mfr: greenstone    Comments: NEXT DEPO DUE 08/16/2010 through 08/30/2010  Patient tolerated injection without complications    Given by: Tessie Fass CMA (May 30, 2010 5:11 PM)  Orders Added: 1)  U Preg-[facility] [81025] 2)  Wet Prep- [facility] [87210] 3)  GC/Chlamydia-[facility] [87591/87491] 4)  Depo-Provera 150mg  [J1055] 5)  [facility]- Est Level  3 [04540]    Laboratory Results   Urine Tests  Date/Time Received: May 30, 2010 3:08 PM  Date/Time Reported: May 30, 2010 3:35 PM     Urine HCG: negative Comments: ...............test performed by......Marland KitchenBonnie A. [place], MLS (ASCP)cm  Date/Time Received: May 30, 2010 3:35 PM  Date/Time Reported: May 30, 2010 4:01 PM   Wet [place] Source:  vag WBC/hpf: 1-3 Bacteria/hpf: 3+  Cocci Clue cells/hpf: moderate  Positive whiff Yeast/hpf: none Trichomonas/hpf: none Comments: few sperm present ...............test performed by......Marland KitchenBonnie A. [place], MLS (ASCP)cm

## 2010-06-29 NOTE — Letter (Signed)
Summary: Generic Letter  [facility]  [address]   [place], [place] [address]   Phone: [phone]  Fax: [phone]    06/02/2010  Shannon Randolph [address] [place], [place]  [address]  Dear [name],  I wanted to let you know that your labs are normal. If you have any questions, please give [place] a call.          Sincerely,   Doree Albee MD

## 2010-07-06 ENCOUNTER — Encounter: Payer: Self-pay | Admitting: *Deleted

## 2010-08-08 LAB — CBC
HCT: 35.3 % — ABNORMAL LOW (ref 36.0–46.0)
Hemoglobin: 10.3 g/dL — ABNORMAL LOW (ref 12.0–15.0)
Hemoglobin: 11.8 g/dL — ABNORMAL LOW (ref 12.0–15.0)
MCH: 29.3 pg (ref 26.0–34.0)
MCH: 30.1 pg (ref 26.0–34.0)
MCHC: 34 g/dL (ref 30.0–36.0)
MCV: 87.3 fL (ref 78.0–100.0)
RBC: 3.44 MIL/uL — ABNORMAL LOW (ref 3.87–5.11)
RBC: 4.04 MIL/uL (ref 3.87–5.11)
WBC: 15.2 10*3/uL — ABNORMAL HIGH (ref 4.0–10.5)
WBC: 17.1 10*3/uL — ABNORMAL HIGH (ref 4.0–10.5)

## 2010-08-08 LAB — WET PREP, GENITAL

## 2010-08-08 LAB — RPR: RPR Ser Ql: NONREACTIVE

## 2010-08-10 LAB — URINE MICROSCOPIC-ADD ON

## 2010-08-10 LAB — URINALYSIS, ROUTINE W REFLEX MICROSCOPIC
Glucose, UA: NEGATIVE mg/dL
Nitrite: NEGATIVE
Specific Gravity, Urine: <1.005 — ABNORMAL LOW (ref 1.005–1.030)

## 2010-08-18 ENCOUNTER — Ambulatory Visit (INDEPENDENT_AMBULATORY_CARE_PROVIDER_SITE_OTHER): Payer: Medicaid Other | Admitting: *Deleted

## 2010-08-18 DIAGNOSIS — IMO0001 Reserved for inherently not codable concepts without codable children: Secondary | ICD-10-CM

## 2010-08-18 DIAGNOSIS — Z309 Encounter for contraceptive management, unspecified: Secondary | ICD-10-CM

## 2010-08-18 MED ORDER — MEDROXYPROGESTERONE ACETATE 150 MG/ML IM SUSP
150.0000 mg | Freq: Once | INTRAMUSCULAR | Status: AC
  Administered 2010-08-18: 150 mg via INTRAMUSCULAR

## 2010-08-21 LAB — URINE CULTURE

## 2010-08-21 LAB — URINALYSIS, ROUTINE W REFLEX MICROSCOPIC
Hgb urine dipstick: NEGATIVE
Nitrite: NEGATIVE
Specific Gravity, Urine: 1.02 (ref 1.005–1.030)
Urobilinogen, UA: 4 mg/dL — ABNORMAL HIGH (ref 0.0–1.0)

## 2010-08-21 LAB — ABO/RH: ABO/RH(D): B POS

## 2010-08-21 LAB — URINE MICROSCOPIC-ADD ON

## 2010-08-21 LAB — GC/CHLAMYDIA PROBE AMP, GENITAL: GC Probe Amp, Genital: POSITIVE — AB

## 2010-08-21 LAB — CBC
HCT: 36.8 % (ref 36.0–46.0)
Hemoglobin: 12.1 g/dL (ref 12.0–15.0)
RBC: 4.12 MIL/uL (ref 3.87–5.11)
WBC: 11.2 10*3/uL — ABNORMAL HIGH (ref 4.0–10.5)

## 2010-08-21 LAB — WET PREP, GENITAL

## 2010-08-30 MED ORDER — MEDROXYPROGESTERONE ACETATE 150 MG/ML IM SUSP: 150.0000 mg | INTRAMUSCULAR | Status: DC

## 2010-08-30 NOTE — Progress Notes (Signed)
Addended by: Doree Albee on: 08/30/2010 06:41 PM   Modules accepted: Orders

## 2010-09-01 LAB — URINALYSIS, ROUTINE W REFLEX MICROSCOPIC
Bilirubin Urine: NEGATIVE
Glucose, UA: NEGATIVE mg/dL
Hgb urine dipstick: NEGATIVE
Protein, ur: NEGATIVE mg/dL
Urobilinogen, UA: 0.2 mg/dL (ref 0.0–1.0)

## 2010-10-31 ENCOUNTER — Ambulatory Visit (INDEPENDENT_AMBULATORY_CARE_PROVIDER_SITE_OTHER): Payer: Medicaid Other | Admitting: Family Medicine

## 2010-10-31 ENCOUNTER — Encounter: Payer: Self-pay | Admitting: Family Medicine

## 2010-10-31 VITALS — BP 115/82 | HR 112 | Temp 98.4°F | Ht 63.0 in | Wt 143.0 lb

## 2010-10-31 DIAGNOSIS — IMO0001 Reserved for inherently not codable concepts without codable children: Secondary | ICD-10-CM

## 2010-10-31 DIAGNOSIS — Z309 Encounter for contraceptive management, unspecified: Secondary | ICD-10-CM

## 2010-10-31 NOTE — Patient Instructions (Signed)
It was good to see you today We will hold on placing the IUD until your menstrual cycle is over Make sure to use condoms if you are sexually active Come back in 2-3 weeks for placement of your IUD Try eating 5 servings of vegetable daily (broccoli and peas) Try exercising > 1 hour, 3 times per week Call if you have any other questions,  God Bless, Doree Albee MD

## 2010-11-01 NOTE — Progress Notes (Signed)
  Subjective:    Patient ID: Mindi Slicker, female    DOB: 09-28-1990, 20 y.o.   MRN: [id number]  HPI Pt here for evaluation of contraception. Pt has had 2 depo-provera injections since NSVD 03/2010. Pt is considering IUD for contraception. Pt has tolerated depo-provera without incident. Pt does report some subjective hyperphagia with medication. No polyuria, polydypsia. LMP is currently ongoing.    Review of Systems     Objective:   Physical Exam Gen: up in bed, NAD CV: RRR, no murmurs PULM: CTAB, no wheezes, rales, rhoncii    Assessment & Plan:  Contraception: Will defer IUD placement as pt is actively menstruating. Most recent depo shot 08/18/10. Instructed pt to use condoms with any sexual activity. Will follow up in 2 weeks for hopeful IUD placement. Will give additional depo shot at follow up if unable to place IUD.

## 2010-11-01 NOTE — Assessment & Plan Note (Signed)
Will defer IUD placement as pt is actively menstruating. Most recent depo shot 08/18/10. Instructed pt to use condoms with any sexual activity. Will follow up in 2 weeks for hopeful IUD placement ( should be within 3 month window for depo coverage). Will give additional depo shot at follow up if unable to place IUD.

## 2010-12-11 ENCOUNTER — Ambulatory Visit (INDEPENDENT_AMBULATORY_CARE_PROVIDER_SITE_OTHER): Payer: Medicaid Other | Admitting: Family Medicine

## 2010-12-11 ENCOUNTER — Encounter: Payer: Self-pay | Admitting: Family Medicine

## 2010-12-11 VITALS — BP 110/71 | HR 70 | Wt 146.9 lb

## 2010-12-11 DIAGNOSIS — Z30430 Encounter for insertion of intrauterine contraceptive device: Secondary | ICD-10-CM

## 2010-12-11 DIAGNOSIS — Z309 Encounter for contraceptive management, unspecified: Secondary | ICD-10-CM

## 2010-12-11 DIAGNOSIS — A499 Bacterial infection, unspecified: Secondary | ICD-10-CM

## 2010-12-11 DIAGNOSIS — IMO0001 Reserved for inherently not codable concepts without codable children: Secondary | ICD-10-CM

## 2010-12-11 DIAGNOSIS — N898 Other specified noninflammatory disorders of vagina: Secondary | ICD-10-CM | POA: Insufficient documentation

## 2010-12-11 DIAGNOSIS — N760 Acute vaginitis: Secondary | ICD-10-CM

## 2010-12-11 DIAGNOSIS — B9689 Other specified bacterial agents as the cause of diseases classified elsewhere: Secondary | ICD-10-CM

## 2010-12-11 LAB — POCT WET PREP (WET MOUNT): Trichomonas Wet Prep HPF POC: NEGATIVE

## 2010-12-11 MED ORDER — METRONIDAZOLE 500 MG PO TABS: 500.0000 mg | ORAL_TABLET | Freq: Two times a day (BID) | ORAL | Status: DC

## 2010-12-11 NOTE — Patient Instructions (Signed)
IMPORTANT: HOW TO USE THIS INFORMATION:  This is a summary and does NOT have all possible information about this product. This information does not assure that this product is safe, effective, or appropriate for you. This information is not individual medical advice and does not substitute for the advice of your health care professional. Always ask your health care professional for complete information about this product and your specific health needs.    LEVONORGESTREL-RELEASING IMPLANT - INTRAUTERINE (lee-voh-nor-JEST-rell)    COMMON BRAND NAME(S): Mirena    USES:  This product is a small, flexible device that is placed in the womb (uterus) to prevent pregnancy. It is used in women who desire reversible birth control that works for a long time (up to 5 years). The device works by slowly releasing a hormone (levonorgestrel) that is similar to a certain substance made by a woman's body. This product is only intended for women who have previously given birth and have only one sexual partner. It is not meant for women with a history of certain infections/conditions (e.g., pelvic inflammatory disease, sexually transmitted disease, a certain problem pregnancy called ectopic pregnancy). For more information, consult your doctor. The use of this medication device does not protect you or your partner against sexually transmitted diseases (e.g., HIV, gonorrhea). Carefully read all of the information provided by your doctor, and ask any questions you may have about this product or other birth control methods that may be right for you.    HOW TO USE:  Read the Patient Information Leaflet provided by your pharmacist before this medication device is inserted and each time it is re-inserted. The leaflet contains very important information about side effects and when it is important to call your doctor. If you have any questions, consult your doctor or pharmacist. This product is inserted into your uterus by a properly  trained health care professional, usually once every 5 years or as determined by your doctor. The medication in the device is slowly released into the body over a 5-year period. Have a follow-up appointment 4-12 weeks after insertion of this product to check that it is still correctly in place. If you still desire birth control after 5 years, the medication device may be replaced with a new one. The medication device may also be removed at any time by a properly trained health care professional. Learn all the instructions on how and when to check this product and its proper positioning in your body, and make sure you understand the problems that may occur with this product. See also Precautions section.    SIDE EFFECTS:  Irregular vaginal bleeding (e.g., spotting), cramps, headache, nausea, breast pain, acne, rash, hair loss, weight gain, or decreased interest in sex may occur. If any of these effects persist or worsen, tell your doctor promptly. Remember that your doctor has prescribed this medication device because he or she has judged that the benefit to you is greater than the risk of side effects. Many people using this medication device do not have serious side effects. Tell your doctor immediately if any of these serious side effects occur: lack of menstrual period, unexplained fever, chills, trouble breathing, mental/mood changes (e.g., depression, nervousness), vaginal swelling/itching, painful intercourse. Tell your doctor immediately if any of these unlikely but serious side effects occur: migraine/severe headache, vomiting, tiredness, fast/pounding heartbeat. Tell your doctor immediately if any of these highly unlikely but very serious side effects occur: prolonged or heavy vaginal bleeding, unusual vaginal discharge/odor, vaginal sores, abdominal/pelvic pain or   tenderness, lumps in the breast, yellowing eyes/skin, dark urine, persistent nausea, trouble urinating. A very serious allergic reaction to  this drug is rare. However, seek immediate medical attention if you notice any of the following symptoms of a serious allergic reaction: rash, itching/swelling (especially of the face/tongue/throat), severe dizziness, trouble breathing. This is not a complete list of possible side effects. If you notice other effects not listed above, contact your doctor or pharmacist. In the [place] - Call your doctor for medical advice about side effects. You may report side effects to FDA at 1-800-FDA-1088. In [place] - Call your doctor for medical advice about side effects. You may report side effects to [employer] [place] at 1-866-234-2345.    PRECAUTIONS:  Before using this medication device, tell your doctor or pharmacist if you are allergic to levonorgestrel, or to any other progestins (e.g., norethindrone, desogestrel); or if you have any other allergies. This product may contain inactive ingredients, which can cause allergic reactions or other problems. Talk to your pharmacist for more details. This medication device should not be used if you have certain medical conditions. Before using this product, consult your doctor or pharmacist if you have: current known or suspected pregnancy, previous ectopic pregnancy, uterus problems (e.g., cancer, endometriosis, fibroids, pelvic inflammatory disease-PID), other IUD (intrauterine device) still in place, vaginal problems (e.g., infection), breast cancer, liver disease/tumors, any condition that affects your immune system (e.g., AIDS, leukemia). Before using this product, tell your doctor your medical history, especially of: bleeding problems (e.g., menstrual changes, clotting problems), heart problems (e.g., congenital valve conditions), high blood pressure, migraine headaches, stroke, diabetes. If you have diabetes, this medication may make it harder to control your blood sugar levels. Monitor your blood sugar regularly as directed by your doctor. Tell your doctor the results and any  symptoms such as increased thirst/urination. Your anti-diabetic medication or diet may need to be adjusted. This medication device may sometimes come out by itself or move out of place. This may result in unwanted pregnancy or other problems. After each menstrual period, check to make sure it is in the right place. Talk to your doctor about how to check your device. If it comes out or you cannot feel its threads, call your doctor promptly, and use a backup birth control method such as condoms. If you or partner has any other sexual partners, this medication device may no longer be a good choice for pregnancy prevention. If you or your partner becomes HIV positive, or if you think you may have been exposed to any sexually transmitted disease, contact your doctor immediately. You should consider having this device removed. This medication device must not be used during pregnancy. If you become pregnant or think you may be pregnant, tell your doctor immediately. If you have just given birth and are not breast-feeding, or if you have had a pregnancy loss or abortion after the 3 months of pregnancy, wait at least 6 weeks (or as directed by your doctor) before using this medication device. Consult your doctor about the problems that may occur during pregnancy while using this product. Levonorgestrel passes into breast milk. Consult your doctor before breast-feeding.    DRUG INTERACTIONS:  Your doctor or pharmacist may already be aware of any possible drug interactions and may be monitoring you for them. Do not start, stop, or change the dosage of any medicine before checking with them first. Before using this medication device, tell your doctor of all prescription and nonprescription medications you may use, especially   of: "blood thinners" (e.g., warfarin), birth control taken by mouth or applied to the skin (patch), certain drug used for varicose vein treatment (sodium tetradecyl sulfate), drugs that affect your immune  response (e.g., corticosteroids such as prednisone). This document does not contain all possible interactions. Therefore, before using this product, tell your doctor or pharmacist of all the products you use. Keep a list of all your medications with you, and share the list with your doctor and pharmacist.    OVERDOSE:  Overdose with this medication is very unlikely because of the way the drug is released from this device. Consult your doctor or pharmacist for more information.    NOTES:  Do not share this medication with others. Keep all appointments with your doctor and the laboratory. You should have regular complete physical exams including blood pressure, breast exam, pelvic exam, and screening for cervical cancer (Pap smear). Follow your doctor's instructions for examining your own breasts, and report any lumps immediately. Consult your doctor for more details.    MISSED DOSE:  Not applicable.    STORAGE:  Before use, store at room temperature at 77 degrees F (25 degrees C) away from light and moisture. Brief storage between 59-86 degrees F (15-30 degrees C) is permitted. Keep all medications and medical devices away from children and pets. Do not flush medications down the toilet or pour them into a drain unless instructed to do so. Properly discard this product when it is expired or no longer needed. Consult your pharmacist or local waste disposal company for more details about how to safely discard your product.    Information last revised June 2010 Copyright(c) 2010 [employer]      

## 2010-12-11 NOTE — Assessment & Plan Note (Signed)
Will check wet prep, GC/Chl.   Addendum: BV on wet prep. Discussed results over the phone with pt. Flagyl called in .

## 2010-12-11 NOTE — Progress Notes (Signed)
  Subjective:    Patient ID: Shannon Randolph, female    DOB: 1991-03-22, 22 y.o.   MRN: [id number]  HPI Pt here for IUD insertion. Is somewhat nervous about placement but is willing to go through with the procedure. Pt states that she does not desire any other form of birth control.  OCPs- does not desire 2/2 weight gain  Depo provera- As above Implanon- had before and does not like.   Vaginal Discharge: Pt also reports vaginal discharge x 2-3 months. Has been intermittent in nature. Sometimes foul smelling. Denies abd pain, dysuria, fevers. + Hx/o STDs in the past.  Review of Systems See HPI     Objective:   Physical Exam Gen: in bed, NAD Abd: S/NT/+ Bowel sounds  GU: Normal external genitalia, mild bleeding in vagina, scant discharge, mucopurulent discharge at the cervical os, no CMT on bimanual exam.    Assessment & Plan:

## 2010-12-11 NOTE — Assessment & Plan Note (Signed)
IUD placed. Procedure: Placement of ? Mirena  " Patient was counseled regarding the risks/benefits/alternatives of the IUD. " Consent was obtained and all questions answered. " Urine pregnancy test negative or patient currently menstruating. Description: Cervix was swabbed three times with Betadine swabs. Sterile gloves donned. ? Sterile single-tooth tenaculum used to grasp anterior lip of the cervix and straighten the endocervical canal. Uterus sounded to 6 cm. IUD loaded per manufacturer's instruction and flange set to 6 cm. IUD placed per manufacturer's directions. Strings trimmed to 3 cm. Patient tolerated the procedure well. Follow-up: Patient counseled regarding techniques for self-monitoring of IUD and given precautions. Patient notified of removal date.

## 2010-12-12 LAB — GC/CHLAMYDIA PROBE AMP, GENITAL: Chlamydia, DNA Probe: NEGATIVE

## 2010-12-12 MED ORDER — LEVONORGESTREL 20 MCG/24HR IU IUD
INTRAUTERINE_SYSTEM | Freq: Once | INTRAUTERINE | Status: AC
  Administered 2010-12-11: 12:00:00 via INTRAUTERINE

## 2010-12-12 NOTE — Progress Notes (Signed)
Addended by: Jone Baseman D on: 12/12/2010 11:41 AM   Modules accepted: Orders

## 2011-01-15 ENCOUNTER — Telehealth: Payer: Self-pay | Admitting: Family Medicine

## 2011-01-15 NOTE — Telephone Encounter (Signed)
Shannon Randolph is calling because the last time she came she was diagnosed with a Bacterial Infection, the medications she was prescribed she was able to take 2 doses but then water ended up getting into the medication.  She has the same or another infection, but this time, there is no smell, just itching and she is wondering if that same medication can be called in for her or does she need to make an appt to be seen.

## 2011-01-15 NOTE — Telephone Encounter (Signed)
Called pt and advised to schedule appt to be checked out. It has been over one month. Pt agreed and will do that. Lorenda Hatchet, Renato Battles

## 2011-02-12 ENCOUNTER — Ambulatory Visit (INDEPENDENT_AMBULATORY_CARE_PROVIDER_SITE_OTHER): Payer: Medicaid Other | Admitting: Family Medicine

## 2011-02-12 ENCOUNTER — Telehealth: Payer: Self-pay | Admitting: Family Medicine

## 2011-02-12 ENCOUNTER — Encounter: Payer: Self-pay | Admitting: Family Medicine

## 2011-02-12 VITALS — BP 114/72 | HR 114 | Temp 98.1°F | Ht 63.0 in | Wt 153.3 lb

## 2011-02-12 DIAGNOSIS — Z23 Encounter for immunization: Secondary | ICD-10-CM

## 2011-02-12 DIAGNOSIS — N760 Acute vaginitis: Secondary | ICD-10-CM

## 2011-02-12 LAB — POCT WET PREP (WET MOUNT)
Clue Cells Wet Prep HPF POC: NEGATIVE
Trichomonas Wet Prep HPF POC: NEGATIVE

## 2011-02-12 NOTE — Progress Notes (Signed)
Addended by: Deno Etienne on: 02/12/2011 04:51 PM   Modules accepted: Orders
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
## 2011-02-12 NOTE — Telephone Encounter (Signed)
Discussed wet prep results with patient.

## 2011-02-13 LAB — GC/CHLAMYDIA PROBE AMP, GENITAL
Chlamydia, DNA Probe: NEGATIVE
GC Probe Amp, Genital: NEGATIVE

## 2011-02-14 ENCOUNTER — Telehealth: Payer: Self-pay | Admitting: Family Medicine

## 2011-02-14 NOTE — Telephone Encounter (Signed)
Revealed results to patient.

## 2011-02-27 ENCOUNTER — Ambulatory Visit (INDEPENDENT_AMBULATORY_CARE_PROVIDER_SITE_OTHER): Payer: Medicaid Other | Admitting: Family Medicine

## 2011-02-27 ENCOUNTER — Encounter: Payer: Self-pay | Admitting: Family Medicine

## 2011-02-27 ENCOUNTER — Other Ambulatory Visit: Payer: Self-pay | Admitting: Family Medicine

## 2011-02-27 VITALS — BP 103/72 | HR 99 | Temp 98.1°F | Wt 147.8 lb

## 2011-02-27 DIAGNOSIS — L089 Local infection of the skin and subcutaneous tissue, unspecified: Secondary | ICD-10-CM

## 2011-02-27 MED ORDER — IBUPROFEN 600 MG PO TABS: 600.0000 mg | ORAL_TABLET | Freq: Four times a day (QID) | ORAL | Status: AC | PRN

## 2011-02-27 MED ORDER — CEPHALEXIN 500 MG PO CAPS: 500.0000 mg | ORAL_CAPSULE | Freq: Two times a day (BID) | ORAL | Status: AC

## 2011-02-27 NOTE — Assessment & Plan Note (Addendum)
Infection at site of lip piercing due to new trauma.  Advised to keep piercing in as a site for drainage of induration and to prevent growing in .  Will rx keflex.  Advised to return if worsening, may need to broaden coverage.  Patient with wash with warm water and antibacterial soap.
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
## 2011-02-27 NOTE — Telephone Encounter (Signed)
Refill request

## 2011-02-27 NOTE — Patient Instructions (Signed)
Leave piercing in.  Keep clean with warm water and antibacterial soap. Come back if not improving

## 2011-03-07 ENCOUNTER — Ambulatory Visit (INDEPENDENT_AMBULATORY_CARE_PROVIDER_SITE_OTHER): Payer: Medicaid Other | Admitting: Family Medicine

## 2011-03-07 ENCOUNTER — Encounter: Payer: Self-pay | Admitting: Family Medicine

## 2011-03-07 VITALS — BP 96/66 | HR 65 | Temp 97.2°F | Ht 63.0 in | Wt 151.0 lb

## 2011-03-07 DIAGNOSIS — L089 Local infection of the skin and subcutaneous tissue, unspecified: Secondary | ICD-10-CM

## 2011-03-07 MED ORDER — CLINDAMYCIN HCL 300 MG PO CAPS: 300.0000 mg | ORAL_CAPSULE | Freq: Three times a day (TID) | ORAL | Status: AC

## 2011-03-07 MED ORDER — HYDROCODONE-ACETAMINOPHEN 5-500 MG PO TABS: 1.0000 | ORAL_TABLET | Freq: Four times a day (QID) | ORAL | Status: DC | PRN
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
## 2011-03-08 NOTE — Assessment & Plan Note (Signed)
Decision made to remove foreign body and thus nidus for infection.  Patient agreed with plan.  Dr. Mauricio Po and Dr. Jennette Kettle assisted with procedure.  Procedure note:  Informed consent given and signed copy in chart. Appropraite time out taken. Area around lip stud prepped and draped in usual sterile fashion. Local anasthesia with 2% lidocaine without epi (8 cc) injected both inner and outer lip stud, after applying small amount of viscous lidocaine to both inner mucosa and outer skin . Small 1 cm incision made horizontally across center of lip clasp.  Blunt and some sharp dissection used to isolate back of clasp, however unable to fully grasp with hemostat.  At this point, large toenail clippers used to cut head of stud off and then stud pulled through skin.  Patient tolerated procedure with only minimal amount of pain.  No sutures required.  Hemostasis easily obtained after direct pressure with sterile gauze.    Treat with 10 day course of clindamycin for both anaerobic coverage and MRSA coverage.  Also provided scipt for Hydrocodone for pain.  To FU in 1 week to ensure proper healing.   Patient concerned because strong family history of keloids.  At this point, focus is removing nidus of infection.  Plan to allow inflammation to resolve, then may need possible ENT versus cosmetic surgery referral if she develops keloid scar.

## 2011-04-11 ENCOUNTER — Ambulatory Visit: Payer: Medicaid Other | Admitting: Family Medicine

## 2011-05-08 ENCOUNTER — Encounter: Payer: Self-pay | Admitting: Family Medicine

## 2011-05-08 ENCOUNTER — Ambulatory Visit (INDEPENDENT_AMBULATORY_CARE_PROVIDER_SITE_OTHER): Payer: Medicaid Other | Admitting: Family Medicine

## 2011-05-08 VITALS — BP 100/70 | HR 100 | Ht 63.0 in | Wt 147.0 lb

## 2011-05-08 DIAGNOSIS — N760 Acute vaginitis: Secondary | ICD-10-CM

## 2011-05-08 DIAGNOSIS — B373 Candidiasis of vulva and vagina: Secondary | ICD-10-CM

## 2011-05-08 DIAGNOSIS — B9689 Other specified bacterial agents as the cause of diseases classified elsewhere: Secondary | ICD-10-CM

## 2011-05-08 DIAGNOSIS — A499 Bacterial infection, unspecified: Secondary | ICD-10-CM

## 2011-05-08 DIAGNOSIS — N898 Other specified noninflammatory disorders of vagina: Secondary | ICD-10-CM

## 2011-05-08 DIAGNOSIS — B3731 Acute candidiasis of vulva and vagina: Secondary | ICD-10-CM

## 2011-05-08 LAB — POCT WET PREP (WET MOUNT): Yeast Wet Prep HPF POC: NEGATIVE

## 2011-05-08 MED ORDER — METRONIDAZOLE 500 MG PO TABS: 500.0000 mg | ORAL_TABLET | Freq: Two times a day (BID) | ORAL | Status: AC

## 2011-05-08 NOTE — Assessment & Plan Note (Addendum)
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 Wet prep obtained. Discussed at length in terms of risk factor reduction including keeping one sexual partner and completing entire course of antibiotics. Pt agreeable to plan. Will also follow up pending wet prep results.

## 2011-05-08 NOTE — Progress Notes (Signed)
  Subjective:    Patient ID: Shannon Randolph, female    DOB: [birth year], 20 y.o.   MRN: [id number]  HPI Vaginal discharge x 2 weeks. + foul odor. Pt has a prior history of BV. Pt states that this episode is similar to previous episodes of BV. No nausea, vomiting, abdominal pain. No fevers. No change in sexual partners.    Review of Systems See HPI, otherwise 12 point ROS negative    Objective:   Physical Exam Gen: up in chair, NAD CV: RRR, no murmurs auscultated PULM: CTAB, no wheezes, rales, rhoncii ABD: S/NT/+ bowel sounds, no lower abdominal tenderness     Assessment & Plan:

## 2011-05-08 NOTE — Patient Instructions (Signed)
Bacterial Vaginosis Bacterial vaginosis (BV) is a vaginal infection where the normal balance of bacteria in the vagina is disrupted. The normal balance is then replaced by an overgrowth of certain bacteria. There are several different kinds of bacteria that can cause BV. BV is the most common vaginal infection in women of childbearing age. CAUSES   The cause of BV is not fully understood. BV develops when there is an increase or imbalance of harmful bacteria.   Some activities or behaviors can upset the normal balance of bacteria in the vagina and put women at increased risk including:   Having a new sex partner or multiple sex partners.   Douching.   Using an intrauterine device (IUD) for contraception.   It is not clear what role sexual activity plays in the development of BV. However, women that have never had sexual intercourse are rarely infected with BV.  Women do not get BV from toilet seats, bedding, swimming pools or from touching objects around them.  SYMPTOMS   Grey vaginal discharge.   A fish-like odor with discharge, especially after sexual intercourse.   Itching or burning of the vagina and vulva.   Burning or pain with urination.   Some women have no signs or symptoms at all.  DIAGNOSIS  Your caregiver must examine the vagina for signs of BV. Your caregiver will perform lab tests and look at the sample of vaginal fluid through a microscope. They will look for bacteria and abnormal cells (clue cells), a pH test higher than 4.5, and a positive amine test all associated with BV.  RISKS AND COMPLICATIONS   Pelvic inflammatory disease (PID).   Infections following gynecology surgery.   Developing HIV.   Developing herpes virus.  TREATMENT  Sometimes BV will clear up without treatment. However, all women with symptoms of BV should be treated to avoid complications, especially if gynecology surgery is planned. Female partners generally do not need to be treated. However,  BV may spread between female sex partners so treatment is helpful in preventing a recurrence of BV.   BV may be treated with antibiotics. The antibiotics come in either pill or vaginal cream forms. Either can be used with nonpregnant or pregnant women, but the recommended dosages differ. These antibiotics are not harmful to the baby.   BV can recur after treatment. If this happens, a second round of antibiotics will often be prescribed.   Treatment is important for pregnant women. If not treated, BV can cause a premature delivery, especially for a pregnant woman who had a premature birth in the past. All pregnant women who have symptoms of BV should be checked and treated.   For chronic reoccurrence of BV, treatment with a type of prescribed gel vaginally twice a week is helpful.  HOME CARE INSTRUCTIONS   Finish all medication as directed by your caregiver.   Do not have sex until treatment is completed.   Tell your sexual partner that you have a vaginal infection. They should see their caregiver and be treated if they have problems, such as a mild rash or itching.   Practice safe sex. Use condoms. Only have 1 sex partner.  PREVENTION  Basic prevention steps can help reduce the risk of upsetting the natural balance of bacteria in the vagina and developing BV:  Do not have sexual intercourse (be abstinent).   Do not douche.   Use all of the medicine prescribed for treatment of BV, even if the signs and symptoms go away.     Tell your sex partner if you have BV. That way, they can be treated, if needed, to prevent reoccurrence.  SEEK MEDICAL CARE IF:   Your symptoms are not improving after 3 days of treatment.   You have increased discharge, pain, or fever.  MAKE SURE YOU:   Understand these instructions.   Will watch your condition.   Will get help right away if you are not doing well or get worse.  FOR MORE INFORMATION  Division of STD Prevention (DSTDP), Centers for Disease  Control and Prevention: www.cdc.gov/std American Social Health Association (ASHA): www.ashastd.org  Document Released: 05/14/2005 Document Revised: 01/24/2011 Document Reviewed: 11/04/2008 ExitCare Patient Information 2012 [employer]. 

## 2011-07-30 ENCOUNTER — Ambulatory Visit (INDEPENDENT_AMBULATORY_CARE_PROVIDER_SITE_OTHER): Payer: Medicaid Other | Admitting: Family Medicine

## 2011-07-30 ENCOUNTER — Other Ambulatory Visit (HOSPITAL_COMMUNITY)
Admission: RE | Admit: 2011-07-30 | Discharge: 2011-07-30 | Disposition: A | Discharge status: Home / Self Care | Payer: Medicaid Other | Source: Ambulatory Visit | Attending: Family Medicine | Admitting: Family Medicine

## 2011-07-30 ENCOUNTER — Encounter: Payer: Self-pay | Admitting: Family Medicine

## 2011-07-30 VITALS — BP 123/71 | HR 77 | Ht 63.0 in | Wt 142.0 lb

## 2011-07-30 DIAGNOSIS — N760 Acute vaginitis: Secondary | ICD-10-CM

## 2011-07-30 DIAGNOSIS — Z113 Encounter for screening for infections with a predominantly sexual mode of transmission: Secondary | ICD-10-CM | POA: Insufficient documentation

## 2011-07-30 DIAGNOSIS — N898 Other specified noninflammatory disorders of vagina: Secondary | ICD-10-CM

## 2011-07-30 LAB — POCT WET PREP (WET MOUNT)

## 2011-07-30 MED ORDER — METRONIDAZOLE 500 MG PO TABS: 500.0000 mg | ORAL_TABLET | Freq: Two times a day (BID) | ORAL | Status: AC

## 2011-07-30 NOTE — Patient Instructions (Signed)
It was nice to meet you. We will call and let you know what the wet prep shows and will call in medicine if you need it.

## 2011-07-30 NOTE — Progress Notes (Signed)
S: Pt comes in today for vaginal discharge.  Has been going on, off and on, for months.  Reports that she has never actually completed treatment for BV because she either does not tolerate the medication or something happens to it.  After discussion, patient would prefer po over transvaginal treatment if this is BV again.  + odor, +thin white d/c, no abdominal or pelvic pain.  Some concern for GC/Chlam.    ROS: Per HPI  History  Smoking status  . Never Smoker   Smokeless tobacco  . Never Used    O:  Filed Vitals:   07/30/11 1640  BP: 123/71  Pulse: 77    Gen: NAD Abd: soft, NT Pelvic: no external lesions, os normal, nonfriable, IUD strings visualized, + thin white discharge, no CMT, no adnexal masses or tenderness    A/P: 21 y.o. female p/w vaginal d/c, c/w BV on wet prep -See problem list -f/u PRN

## 2011-07-30 NOTE — Assessment & Plan Note (Signed)
H&P, wet prep c/w BV, will treat with flagyl po.  Patient aware that there is a vaginal suppository option that may be a good alternative if she does not tolerate the po.

## 2011-07-31 ENCOUNTER — Telehealth: Payer: Self-pay | Admitting: *Deleted

## 2011-07-31 NOTE — Telephone Encounter (Signed)
Message copied by Arlyss Repress on Tue Jul 31, 2011  9:41 AM ------      Message from: Demetria Pore A      Created: Mon Jul 30, 2011  6:27 PM       Please call pt and tell her results of wet prep c/w BV.  Antibiotics sent to pharmacy; I will change it to the vaginal suppository if she would like, but I sent po.

## 2011-07-31 NOTE — Telephone Encounter (Signed)
Pt called back and I informed her of Dr.McGill's message. Pt verbalized understanding. Lorenda Hatchet, Renato Battles

## 2011-07-31 NOTE — Telephone Encounter (Signed)
Called pt. Voice mail box is not set up yet. Waiting for call back. See Dr.McGill's message. Lorenda Hatchet, Renato Battles

## 2011-08-01 ENCOUNTER — Encounter: Payer: Self-pay | Admitting: Family Medicine

## 2011-09-14 ENCOUNTER — Telehealth: Payer: Self-pay | Admitting: Family Medicine

## 2011-09-14 MED ORDER — METRONIDAZOLE 0.75 % VA GEL: 1.0000 | Freq: Two times a day (BID) | VAGINAL | Status: DC

## 2011-09-14 NOTE — Telephone Encounter (Signed)
Called pt and informed. .Shannon Randolph  

## 2011-09-14 NOTE — Telephone Encounter (Signed)
Will fill vaginal suppository request.  Needs to use BID x5 days.  If she does not use this Rx and needs a new one sent in the future, she will need to be since since this was diagnosed 5 weeks ago. Thanks!

## 2011-09-14 NOTE — Telephone Encounter (Signed)
Patient is calling about her vaginal problems that seem to be continuing.  The medication that has been prescribed makes her sick on her stomach.  She tried to force herself to take it and she vomited, so she is hoping for a Rx for something else, but needs to know if that can be done without her being seen.

## 2011-09-14 NOTE — Telephone Encounter (Signed)
Called pt. She reports that she only took one pill (last RX), because she did vomit after taking it. She request for Dr.McGill to send in the vaginal suppositories that were discussed with her before. (see previous messages). I told the pt, that's one month ago. Pt said, 'please ask Dr.McGill to send the suppositories to the pharmacy. I just did not take the medicines earlier, because I had no time' I told the pt, that I will ask Dr.McGill, but can not promise that it can be done w/out OV.  Lorenda Hatchet, Renato Battles

## 2011-10-11 ENCOUNTER — Ambulatory Visit: Payer: Medicaid Other | Admitting: Family Medicine

## 2011-10-16 ENCOUNTER — Ambulatory Visit (INDEPENDENT_AMBULATORY_CARE_PROVIDER_SITE_OTHER): Payer: Medicaid Other | Admitting: Family Medicine

## 2011-10-16 ENCOUNTER — Other Ambulatory Visit (HOSPITAL_COMMUNITY)
Admission: RE | Admit: 2011-10-16 | Discharge: 2011-10-16 | Disposition: A | Discharge status: Home / Self Care | Payer: Medicaid Other | Source: Ambulatory Visit | Attending: Family Medicine | Admitting: Family Medicine

## 2011-10-16 ENCOUNTER — Encounter: Payer: Self-pay | Admitting: Family Medicine

## 2011-10-16 VITALS — BP 98/68 | HR 93 | Ht 63.0 in | Wt 132.0 lb

## 2011-10-16 DIAGNOSIS — M545 Low back pain, unspecified: Secondary | ICD-10-CM

## 2011-10-16 DIAGNOSIS — N760 Acute vaginitis: Secondary | ICD-10-CM

## 2011-10-16 DIAGNOSIS — Z113 Encounter for screening for infections with a predominantly sexual mode of transmission: Secondary | ICD-10-CM | POA: Insufficient documentation

## 2011-10-16 DIAGNOSIS — M549 Dorsalgia, unspecified: Secondary | ICD-10-CM

## 2011-10-16 DIAGNOSIS — B9689 Other specified bacterial agents as the cause of diseases classified elsewhere: Secondary | ICD-10-CM

## 2011-10-16 DIAGNOSIS — A499 Bacterial infection, unspecified: Secondary | ICD-10-CM

## 2011-10-16 HISTORY — DX: Other specified bacterial agents as the cause of diseases classified elsewhere: B96.89

## 2011-10-16 LAB — POCT WET PREP (WET MOUNT)

## 2011-10-16 MED ORDER — KETOROLAC TROMETHAMINE 60 MG/2ML IM SOLN: 60.0000 mg | Freq: Once | INTRAMUSCULAR | Status: DC

## 2011-10-16 MED ORDER — METRONIDAZOLE 0.75 % VA GEL: 1.0000 | Freq: Two times a day (BID) | VAGINAL | Status: AC

## 2011-10-16 MED ORDER — ACIDOPHILUS PO CAPS: 1.0000 | ORAL_CAPSULE | Freq: Every day | ORAL | Status: DC

## 2011-10-16 MED ORDER — KETOROLAC TROMETHAMINE 60 MG/2ML IJ SOLN
60.0000 mg | Freq: Once | INTRAMUSCULAR | Status: AC
  Administered 2011-10-16: 60 mg via INTRAMUSCULAR

## 2011-10-16 NOTE — Assessment & Plan Note (Addendum)
This has been a recurrent issue with medication non compliance being a major issue. Had relatively lengthy discussion with pt treatment. Pt is agreeable to holding on IUD removal pending treatment and medication compliance. Wet prep, GC/Chl today.  Follow up prn.

## 2011-10-16 NOTE — Patient Instructions (Signed)
View All  Adult female external genitalia  The Basics  Patient information: Chlamydia and gonorrhea (The Basics) Patient information: HIV/AIDS (The Basics) Patient information: Vaginal discharge in adults (The Basics) Patient information: Vaginal yeast infection (The Basics) Beyond the Basics  Patient information: Bacterial vaginosis (Beyond the Basics) Patient information: Vaginal discharge in adult women (Beyond the Basics) Patient information: Bacterial vaginosis (The Basics)View in SpanishWritten by the doctors and editors at UpToDate  What is bacterial vaginosis? -- Bacterial vaginosis is an infection in the vagina that can cause bad-smelling vaginal discharge. "Vaginal discharge" is the term doctors and nurses use to describe any fluid that comes out of the vagina (figure 1). Normally, women have a small amount of vaginal discharge each day. But women with bacterial vaginosis can have a lot of vaginal discharge, or vaginal discharge that smells bad. Bacterial vaginosis is caused by certain bacteria (germs). The vagina normally has different types of bacteria in it. When the amounts or the types of bacteria change, an infection can happen.  Women do not catch bacterial vaginosis from having sex. But women who have bacterial vaginosis have a higher chance of catching other infections from their partner during sex. What are the symptoms of bacterial vaginosis? -- Most women with bacterial vaginosis have no symptoms. When women have symptoms, they often have a "fishy-smelling" vaginal discharge that they might notice more after sex. The discharge is watery and off-white or gray.  Some women can also have other symptoms that are not as common, but can include: Bleeding from the vagina after sex  Itching on the outside of the vagina  Pain when urinating or having sex All of these symptoms can also be caused by other conditions. But if you have these symptoms, let your doctor or nurse know. Is  there a test for bacterial vaginosis? -- Yes. Your doctor or nurse will do an exam. He or she will also take a sample of your vaginal discharge, and do lab tests on the sample to look for an infection. How is bacterial vaginosis treated? -- Bacterial vaginosis is treated with medicine. Two different medicines can be used. They are called: Metronidazole  Clindamycin Both of these medicines come in different forms. They can come as a pill or as a gel or cream that a woman puts inside her vagina. Most women have fewer side effects when they use the gel or cream treatment. But you and your doctor or nurse will decide which medicine and which form is right for you.  It is important that you take all of the medicine your doctor or nurse prescribes, even if your symptoms go away after a few doses. Taking all of your medicine can help prevent the symptoms from coming back. Does my sex partner need to be treated if I have bacterial vaginosis? -- No. Your sex partner does not need to be treated if you have bacterial vaginosis. What happens if my symptoms come back? -- If your symptoms come back, let your doctor or nurse know. You might need treatment with more medicine.  Some women get bacterial vaginosis over and over again. These women might take medicine for 3 to 6 months to try to prevent future infections. What if I am pregnant and have symptoms of bacterial vaginosis? -- If you are pregnant and have symptoms of bacterial vaginosis, tell your doctor or nurse. You might need treatment with medicine.  Can bacterial vaginosis be prevented? -- Sometimes. You can help prevent bacterial vaginosis by: Not  douching (douching is when a woman puts a liquid inside her vagina to rinse it out)  Not having a lot of sex partners  Not smoking More on this topic

## 2011-10-16 NOTE — Progress Notes (Addendum)
  Subjective:    Patient ID: Chera Randolph, female    DOB: August 25, 1990, 21 y.o.   MRN: [id number]  HPI VAGINAL DISCHARGE Onset: recurrent Description: + vaginal discharge and odor Modifying factors: no  Symptoms Odor: yes Itching: no Vaginal burning: no Dysuria: no Bleeding: no Pelvic pain:no  Back pain: yes- has had lumbar back pain x 2-3 weeks. Worse with flexion and extension.  Fever: no Genital sores: no Rash: no Dyspareunia: no GI Symptoms: no  Red Flags:  Missed period: no Recent antibiotics: no Possible STD exposure: no IUD: yes Diabetes: no  LBP:  x2-3 weeks.  No traumatic cause.  Worse with flexion and extension.  No radicular sxs.   Review of Systems See HPI, otherwise ROS negative     Objective:   Physical Exam Gen: up in chair, NAD HEENT: NCAT, EOMI, TMs clear bilaterally CV: RRR, no murmurs auscultated PULM: CTAB, no wheezes, rales, rhoncii ABD: S/NT/+ bowel sounds  EXT: 2+ peripheral pulses MSK: + lumbar paraspinal TTP         Assessment & Plan:

## 2011-10-18 ENCOUNTER — Telehealth: Payer: Self-pay | Admitting: *Deleted

## 2011-10-18 DIAGNOSIS — M545 Low back pain, unspecified: Secondary | ICD-10-CM | POA: Insufficient documentation

## 2011-10-18 NOTE — Telephone Encounter (Signed)
Attempted to call patient, the number listed does not accept incoming calls. Called emergency contact number it it incorrect. If patient calls she needs to be scheduled for a nurse visit for STD Tx.Heman Que, Rodena Medin

## 2011-10-18 NOTE — Assessment & Plan Note (Signed)
Mild lumbar strain. Tylenol, NSAIDs. RICE. Follow up prn.

## 2011-10-19 NOTE — Telephone Encounter (Signed)
Attempted to call patient again, this number does not accept incoming calls. Unable to reach patient by phone, faxed form with positive result to [facility].Markese Bloxham, Rodena Medin

## 2011-11-23 ENCOUNTER — Ambulatory Visit (INDEPENDENT_AMBULATORY_CARE_PROVIDER_SITE_OTHER): Payer: Medicaid Other | Admitting: Family Medicine

## 2011-11-23 ENCOUNTER — Encounter: Payer: Self-pay | Admitting: Family Medicine

## 2011-11-23 VITALS — BP 110/70 | HR 90 | Ht 63.0 in | Wt 128.9 lb

## 2011-11-23 DIAGNOSIS — Z309 Encounter for contraceptive management, unspecified: Secondary | ICD-10-CM

## 2011-11-23 DIAGNOSIS — IMO0001 Reserved for inherently not codable concepts without codable children: Secondary | ICD-10-CM

## 2011-11-23 NOTE — Progress Notes (Signed)
  Subjective:    Patient ID: Shannon Randolph, female    DOB: 1990-07-16, 21 y.o.   MRN: [id number]  HPI Patient presents today for IUD removal. Patient states that she desires to have twice a day her mood and perform natural birth control instead. Patient states that she wishes to not have sex until she is married. Patient does not desire any other form of birth control. Last sexual activity was greater than 3 months ago per patient.   Review of Systems See HPI, otherwise ROS negative     Objective:   Physical Exam Gen: up in chair, NAD HEENT: NCAT, EOMI, TMs clear bilaterally CV: RRR, no murmurs auscultated PULM: CTAB, no wheezes, rales, rhoncii ABD: S/NT/+ bowel sounds  GU: normal external genitalia,  EXT: 2+ peripheral pulses         Assessment & Plan:

## 2011-11-23 NOTE — Patient Instructions (Signed)
Contraception Choices Birth control (contraception) can stop pregnancy from happening. Different types of birth control work in different ways. Some can:  Make the mucus in the cervix thick. This makes it hard for sperm to get into the uterus.   Thin the lining of the uterus. This makes it hard for an egg to attach to the wall of the uterus.   Stop the ovaries from releasing an egg.   Block the sperm from reaching the egg.  Certain types of surgery can stop pregnancy from happening. For women, the sugery closes the fallopian tubes (tubal ligation). For men, the surgery stops sperm from releasing during sex (vasectomy). HORMONAL BIRTH CONTROL Hormonal birth control stops pregnancy by putting hormones into your body. Types of birth control include:  A small tube put under the skin of the upper arm (implant). The tube can stay in place for 3 years.   Shots given every 3 months.   Pills taken every day or once after sex (intercourse).   Patches that are changed once a week.   A ring put into the vagina (vaginal ring). The ring is left in place for 3 weeks and removed for 1 week. Then, a new ring is put in the vagina.  BARRIER BIRTH CONTROL  Barrier birth control blocks sperm from reaching the egg. Types of birth control include:   A thin covering worn on the penis (female condom) during sex.   A soft, loose covering put into the vagina (female condom) before sex.   A rubber bowl that sits over the cervix (diaphragm). The bowl must be made for you. The bowl is put into the vagina before sex. The bowl is left in place for 6 to 8 hours after sex.   A small, soft cup that fits over the cervix (cervical cap). The cup must be made for you. The cup can be left in place for 48 hours after sex.   A sponge that is put into the vagina before sex.   A chemical that kills or blocks sperm from getting into the cervix and uterus (spermicide). The chemical may be a cream, jelly, foam, or pill.    INTRAUTERINE (IUD) BIRTH CONTROL  IUD birth control is a small, T-shaped piece of plastic. The plastic is put inside the uterus. There are 2 types of IUD:  Copper IUD. The IUD is covered in copper wire. The copper makes a fluid that kills sperm. It can stay in place for 10 years.   Hormone IUD. The hormone stops pregnancy from happening. It can stay in place for 5 years.  NATURAL FAMILY PLANNING BIRTH CONTROL  Natural family planning means not having sex or using barrier birth control when the woman is fertile. A woman can:  Use a calendar to keep track of when she is fertile.   Use a thermometer to measure her body temperature.  Protect yourself against sexual diseases no matter what type of birth control you use. Talk to your doctor about which type of birth control is best for you. Document Released: 03/11/2009 Document Revised: 05/03/2011 Document Reviewed: 09/20/2010 ExitCare Patient Information 2012 [employer]. 

## 2011-11-26 NOTE — Assessment & Plan Note (Signed)
IUD removed at bedside without complications.  Pt desires no birth control currently.  Discussed risks of this included unwanted/unplanned pregnancy. Pt expressed understanding.  Pt is addiment about natural birth control.  Handout given about contraception options.

## 2012-02-11 ENCOUNTER — Ambulatory Visit (INDEPENDENT_AMBULATORY_CARE_PROVIDER_SITE_OTHER): Payer: Medicaid Other | Admitting: Emergency Medicine

## 2012-02-11 ENCOUNTER — Telehealth: Payer: Self-pay | Admitting: Emergency Medicine

## 2012-02-11 ENCOUNTER — Encounter: Payer: Self-pay | Admitting: Emergency Medicine

## 2012-02-11 ENCOUNTER — Other Ambulatory Visit (HOSPITAL_COMMUNITY)
Admission: RE | Admit: 2012-02-11 | Discharge: 2012-02-11 | Disposition: A | Discharge status: Home / Self Care | Payer: Medicaid Other | Source: Ambulatory Visit | Attending: Family Medicine | Admitting: Family Medicine

## 2012-02-11 ENCOUNTER — Other Ambulatory Visit: Payer: Self-pay | Admitting: Emergency Medicine

## 2012-02-11 VITALS — BP 110/70 | HR 80 | Ht 63.0 in | Wt 123.5 lb

## 2012-02-11 DIAGNOSIS — A499 Bacterial infection, unspecified: Secondary | ICD-10-CM

## 2012-02-11 DIAGNOSIS — B9689 Other specified bacterial agents as the cause of diseases classified elsewhere: Secondary | ICD-10-CM

## 2012-02-11 DIAGNOSIS — Z3490 Encounter for supervision of normal pregnancy, unspecified, unspecified trimester: Secondary | ICD-10-CM

## 2012-02-11 DIAGNOSIS — N898 Other specified noninflammatory disorders of vagina: Secondary | ICD-10-CM

## 2012-02-11 DIAGNOSIS — Z3201 Encounter for pregnancy test, result positive: Secondary | ICD-10-CM

## 2012-02-11 DIAGNOSIS — N760 Acute vaginitis: Secondary | ICD-10-CM

## 2012-02-11 DIAGNOSIS — N912 Amenorrhea, unspecified: Secondary | ICD-10-CM

## 2012-02-11 DIAGNOSIS — A749 Chlamydial infection, unspecified: Secondary | ICD-10-CM

## 2012-02-11 DIAGNOSIS — Z113 Encounter for screening for infections with a predominantly sexual mode of transmission: Secondary | ICD-10-CM | POA: Insufficient documentation

## 2012-02-11 DIAGNOSIS — R300 Dysuria: Secondary | ICD-10-CM

## 2012-02-11 LAB — POCT URINALYSIS DIPSTICK
Blood, UA: NEGATIVE
Glucose, UA: NEGATIVE
Nitrite, UA: NEGATIVE
Urobilinogen, UA: 2

## 2012-02-11 LAB — POCT WET PREP (WET MOUNT)

## 2012-02-11 LAB — POCT URINE PREGNANCY: Preg Test, Ur: POSITIVE

## 2012-02-11 MED ORDER — METRONIDAZOLE 500 MG PO TABS: 500.0000 mg | ORAL_TABLET | Freq: Two times a day (BID) | ORAL | Status: DC

## 2012-02-11 MED ORDER — VITAMIN B-6 25 MG PO TABS: 25.0000 mg | ORAL_TABLET | Freq: Two times a day (BID) | ORAL | Status: DC

## 2012-02-11 MED ORDER — FLUCONAZOLE 150 MG PO TABS: 150.0000 mg | ORAL_TABLET | Freq: Once | ORAL | Status: DC

## 2012-02-11 MED ORDER — AZITHROMYCIN 1 G PO PACK
1.0000 g | PACK | Freq: Once | ORAL | Status: AC
  Administered 2012-02-11: 1 g via ORAL

## 2012-02-11 MED ORDER — PRENATAL VIT-FE FUMARATE-FA 29-1 MG PO CHEW: 1.0000 | CHEWABLE_TABLET | Freq: Every day | ORAL | Status: DC

## 2012-02-11 NOTE — Assessment & Plan Note (Signed)
Urine pregnancy positive. Unsure LMP.  Will get prenatal labs and dating ultrasound.  Will also start gummy prenatals and B6 for morning sickness.  Patient to return for initial prenatal appt.  Counseled on smoking and alcohol cessation.

## 2012-02-11 NOTE — Assessment & Plan Note (Signed)
Positive for chlamydia in 09/2011 and never received treatment.  POCT wet prep showed clue cells and yeast.  Treated with 1g azithromycin in clinic.  Recollected a GC/Chlamydia today.  Prescription for flagyl 500mg  BID x7 days and Diflucan 150mg  x1 sent to pharmacy.  Discussed taking diflucan after completion of antibiotics.

## 2012-02-11 NOTE — Telephone Encounter (Signed)
Called pt. No answer. Voice mail not set up yet. Please tell pt letter is up front. Shannon Randolph, Shannon Randolph

## 2012-02-11 NOTE — Telephone Encounter (Signed)
Pt was seen today and needs a note to be out of school today - she is still nauseous and throwing up  pls call when ready

## 2012-02-11 NOTE — Progress Notes (Signed)
  Subjective:    Patient ID: Shannon Randolph, female    DOB: 10/09/1990, 21 y.o.   MRN: [id number]  HPI Shannon Randolph is here for vaginal discharge and pregnancy test.  1. Pregnancy test: Last period was in July sometime.  Did have a some light spotting last month, but nothing since then.  Had IUD removed in June and did not start another method of birth control.  Having morning sickness.  States spicy food calms her stomach.  Not taking prenatal vitamins.  Has felt fatigued and intermittently short of breath.  Has not done a home pregnancy test.  Quit smoking one month ago.  No current alcohol use.  One prior pregnancy with vaginal delivery.  2. Vaginal discharge: Ongoing for the last 8 months.  Has received prescriptions for flagyl multiple times, but never completes the course.  Discharge is white and foul smelling.  No vaginal itching or pain with intercourse.  Did have positive chlamydia in May for which she states she never got treated.  States only one sexual partner. Not using condoms. Denies abdominal pain or back pain.  No fevers or chills.  I have reviewed and updated the following as appropriate: allergies, current medications, past social history and problem list SHx: former smoker (quit 1 month ago)    Review of Systems See HPI    Objective:   Physical Exam BP 110/70  Pulse 80  Ht 5\' 3"  (1.6 m)  Wt 123 lb 8 oz (56.019 kg)  BMI 21.88 kg/m2  LMP 12/11/2011 Gen: alert, cooperative, NAD HEENT: AT/[place], sclera white, MMM Abd: soft, NTND Pelvic: external genitalia normal, vagina normal, moderate amount of white discharge present, parous cervix     Assessment & Plan:

## 2012-02-11 NOTE — Patient Instructions (Addendum)
It was nice to see you!  Your pregnancy test was positive - congratulations! - please start taking a prenatal vitamin (I sent a prescriptions) - I also sent a prescription for Vitamin B6 to help with nausea and vomiting - You have an ultrasound for dating scheduled - The office will call you in the next week to set up an initial OB appt; please bring your Medicaid card with you.  We treated you with an antibiotic today.  I also sent a prescription for flagyl and diflucan to your pharmacy for BV and yeast.  Take the diflucan AFTER you have finished all the antibiotics. The discharge should get better.  Follow up in a few weeks for a new OB appointment.

## 2012-02-12 ENCOUNTER — Telehealth: Payer: Self-pay | Admitting: Emergency Medicine

## 2012-02-12 NOTE — Telephone Encounter (Signed)
Called patient and discussed positive chlamydia result.  Already treated with Azithromycin 1g in clinic on 9/16.  Patient states she threw up about 30-45 minutes after taking the medication.  Given pregnancy, will need to obtain test of cure in a few weeks.  New OB appt scheduled for 10/11.  Patient filled prenatal and B6 tonight.  No n/v today.

## 2012-02-13 NOTE — Telephone Encounter (Signed)
Spoke with [name] and gave her next appointments for Lab and NOB.  Also informed patient her note for school is at the front desk and she can pick it up at her convenience.  Shannon Randolph

## 2012-02-14 ENCOUNTER — Telehealth: Payer: Self-pay | Admitting: *Deleted

## 2012-02-14 NOTE — Telephone Encounter (Signed)
Faxed pos chlamydia report to [facility]. Pt is aware and has been treated at [facility]. Lorenda Hatchet, Renato Battles

## 2012-02-15 ENCOUNTER — Telehealth: Payer: Self-pay | Admitting: Emergency Medicine

## 2012-02-15 ENCOUNTER — Encounter: Payer: Self-pay | Admitting: Family Medicine

## 2012-02-15 ENCOUNTER — Telehealth: Payer: Self-pay | Admitting: Family Medicine

## 2012-02-15 ENCOUNTER — Ambulatory Visit (INDEPENDENT_AMBULATORY_CARE_PROVIDER_SITE_OTHER): Payer: Medicaid Other | Admitting: Family Medicine

## 2012-02-15 VITALS — BP 111/54 | HR 101 | Temp 98.9°F | Ht 63.0 in | Wt 124.7 lb

## 2012-02-15 DIAGNOSIS — Z2089 Contact with and (suspected) exposure to other communicable diseases: Secondary | ICD-10-CM

## 2012-02-15 DIAGNOSIS — Z202 Contact with and (suspected) exposure to infections with a predominantly sexual mode of transmission: Secondary | ICD-10-CM

## 2012-02-15 DIAGNOSIS — N898 Other specified noninflammatory disorders of vagina: Secondary | ICD-10-CM

## 2012-02-15 LAB — POCT UA - MICROSCOPIC ONLY

## 2012-02-15 LAB — POCT URINALYSIS DIPSTICK
Protein, UA: NEGATIVE
Spec Grav, UA: 1.02
Urobilinogen, UA: 1
pH, UA: 7.5

## 2012-02-15 LAB — POCT WET PREP (WET MOUNT): Clue Cells Wet Prep Whiff POC: NEGATIVE

## 2012-02-15 NOTE — Telephone Encounter (Signed)
Attempted calling patient and voicemail message states " the person has a voicemail that has not been set up yet. ". Will try later.

## 2012-02-15 NOTE — Patient Instructions (Signed)
Gonorrhea, Females and Males  Gonorrhea is an infection. Gonorrhea can be treated with medicines that kill germs (antibiotics). It is necessary that all your sexual partners also be tested for infection and possibly be treated.   CAUSES   Gonorrhea is caused by a germ (bacteria) called Neisseria gonorrhoeae. This infection is spread by sexual contact. The contact that spreads gonorrhea from person to person may be oral, anal, or genital sex.  SYMPTOMS   Females  A woman may have gonorrhea infection and no symptoms. The most common symptoms are:   Pain in the lower abdomen.   Fever, with or without chills.  When these are the most serious problems, the illness is commonly called pelvic inflammatory disease (PID). Other symptoms include:   Abnormal vaginal discharge.   Painful intercourse.   Burning or itching of the vagina or lips of the vagina.   Abnormal vaginal bleeding.   Pain when urinating.  If the infection is spread by anal sex:   Irritation, pain, bleeding, or discharge from the rectum.  If the infection is spread by oral sex with either a man or a woman:   Sore throat, fever, and swollen neck lymph glands.  Other problems may include:   Long-lasting (chronic) pain in the lower abdomen during menstruation, intercourse, or at other times.   Inability to become pregnant.   Premature birth.   Passing the infection onto a newborn baby. This can cause an eye infection in the infant or more serious health problems.  Males  Less frequently than in women, men may have gonorrhea infection and no symptoms. The most common symptoms are:   Discharge from the penis.   Pain or burning during urination.  If the infection is spread by anal sex:   Irritation, pain, bleeding, or discharge from the rectum.  If the infection is spread by oral sex with either a man or a woman:   Sore throat, fever, and swollen neck lymph glands.  DIAGNOSIS   Diagnosis is made by exam of the patient and checking a sample of  discharge under a microscope for the presence of the bacteria. Discharge may be taken from the urethra, cervix, throat, or rectum.  TREATMENT   It is important to diagnose and treat gonorrhea as soon as possible. This prevents damage to the female or female organs or harm to the newborn baby of an infected woman.   Antibiotics are used to treat gonorrhea.   Your sex partners should also be examined and treated if needed.   Testing and treatment for other sexually transmitted diseases (STDs) may be done when you are diagnosed with gonorrhea. Gonorrhea is an STD. You are at risk for other STDs, which are often transmitted around the same time as gonorrhea. These include:   Chlamydia.   Syphilis.   Trichomonas.   Human papillomavirus (HPV).   Human immunodeficiency virus (HIV).   If left untreated, PID can cause women to be unable to have children (sterile). To prevent sterility in females, it is important to be treated as soon as possible and finish all medicines. Unfortunately, sterility or pregnancy occurring outside the uterus (ectopic) may still occur in fully treated women.  HOME CARE INSTRUCTIONS    Finish all medicine as prescribed. Incomplete treatment will put you at risk for continued infection.   Only take over-the-counter or prescription medicines for pain, discomfort, or fever as directed by your caregiver.   Do not have sex until treatment is completed, or as instructed   inform your recent sexual partners. They may need an exam and treatment, even if they have no symptoms. They may need treatment even if they test negative for gonorrhea.  Finding out the results of your test Not all test results are available during your visit. If your test results are not back during the visit, make an appointment with your caregiver to find out the results. Do not assume everything  is normal if you have not heard from your caregiver or the medical facility. It is important for you to follow up on all of your test results. SEEK MEDICAL CARE IF:   You develop any bad reaction to the medicine you were prescribed. This may include:   Rash.   Nausea.   Vomiting.   Diarrhea.   You have an oral temperature above 102 F (38.9 C).   You have symptoms that do not improve, symptoms that get worse, or you develop increased pain. Males may get pain in the testicles and females may get increased abdominal pain.  MAKE SURE YOU:   Understand these instructions.   Will watch your condition.   Will get help right away if you are not doing well or get worse.  Document Released: 05/11/2000 Document Revised: 05/03/2011 Document Reviewed: 09/13/2009 [facility] Patient Information 2012 [place], [place].     Chlamydia, Females and Males Chlamydia is an infection that can be found in the vagina, urethra, cervix, rectum and pelvic organs in the female. In the female, it most often causes urethritis. This happens when it infects the tube (urethra) that carries the urine out of the bladder. When Chlamydia causes urethritis, there may be burning with urination. In males, it may also infect the tubes that carry the sperm from the testicle. This causes pain in the testicles and infect the prostate gland. In females, an infection of the pelvic organs is also called PID (pelvic inflammatory disease). PID may be a cause of sudden (acute) lower abdominal/belly (pelvic) pain and fever. But with Chlamydia, the infection sometimes does not cause problems that you notice (asymptomatic). It may cause an abnormal or watery mucous-like discharge from the birth canal (vagina) or penis.  CAUSES  Chlamydia is caused by germs (bacteria) that are spread during sexual contact of the:  Genitals.   Mouth.   Rectum.  This infection may also be passed to a newborn baby coming through the infected birth canal.  This causes eye and lung infections in the baby. Chlamydia often goes unnoticed. So it is easy to transmit it to a sexual partner without even knowing. SYMPTOMS  In females, symptoms may go unnoticed. Symptoms that are more noticeable can include:  Belly (abdominal) pain.   Painful intercourse.   Watery mucous-like discharge from the vagina.   Miscarriage.   Discomfort when urinating.   Inflammation of the rectum.  In males, symptoms include:  Burning with urination.   Pain in the testicles.   Watery mucous-like discharge from the penis.  It can cause longstanding (chronic) pelvic pain after frequent infections. TREATMENT   Chlamydia can be treated with medications which kill germs(antibiotics).   Inform all sexual partners about the infection. All sexual contacts need to be treated.   If you are pregnant, do not take tetracycline type antibiotics.   PID can cause women to not be able to have children (sterile) if left untreated or if half-treated. It does this by scarring the tubes to the uterus (fallopian tubes). They carry the egg needed to form a baby.  It is important to finish ALL medications given to you.   Sterility or future tubal (ectopic) pregnancies can occur in fully treated individuals. It is important to follow your prescribed treatment. That will lessen the chances of these problems.   This is a sexually transmitted infection. So you are also at risk for other sexually transmitted diseases. These include: Gonorrhea and HIV (AIDS). Testing may be done for the other sexually transmitted diseases if one disease is detected.   It is important to treat chlamydia as soon as possible. It can cause damage to other organs.  HOME CARE INSTRUCTIONS  Finish all medication as prescribed. Incomplete treatment will put you at risk for not being able to have children (sterility) and tubal pregnancy. If one sexually transmitted disease is discovered, often treatment will be  started to cover other possible infections.   Only take over-the-counter or prescription medicines for pain, discomfort, or fever as directed by your caregiver.   Rest.   Eat a balanced diet and drink plenty of fluids.   Warning: This infection is contagious. Do not have sex until treatment is completed. Follow up at your caregiver's office or the clinic to which you were referred. If your diagnosis (learning what is wrong) is confirmed by culture or some other method, your recent sexual contacts need treatment. Even if they are symptom free or have a negative culture or evaluation, they should be treated.   For the protection of your privacy, test results can not be given over the phone. Make sure you receive the results of your test. Ask how these results are to be obtained if you have not been informed. It is your responsibility to obtain your test results.  PREVENTION   Women should use sanitary pads instead of tampons for vaginal discharge.   Wipe front to back after using the toilet and avoid douching.   Test for chlamydia if you are having an IUD inserted.   Practice safe sex, use condoms, have only one sex partner and be sure your sex partner is not having sex with others.   Ask your caregiver to test you for chlamydia at your regular checkups or sooner if you are having symptoms.   Ask for further information if you are pregnant.  SEEK IMMEDIATE MEDICAL CARE IF:   You develop an oral temperature above 102 F (38.9 C), not controlled by medications or lasting more than 2 days.   You develop an increase in pain.   You develop any type of abnormal discharge.   You develop vaginal bleeding and it is not time for your period.   You develop painful intercourse.  MAKE SURE YOU:   Understand these instructions.   Will watch your condition.   Will get help right away if you are not doing well or get worse.  Document Released: 05/14/2005 Document Revised: 05/03/2011 Document  Reviewed: 12/18/2007 [facility] Patient Information 2012 [place], [place].

## 2012-02-15 NOTE — Telephone Encounter (Signed)
-   test results of urinalysis and wet prep today - All WNL  - She is to follow up in two weeks for test of cure for her G&C culture. She was treated on 9/16, with re-exposure

## 2012-02-15 NOTE — Telephone Encounter (Signed)
Pt is asking to speak to nurse - she was tested positive for [facility] and wants to come in to get another treatment - (she had unprotested sex afterward last visit)

## 2012-02-15 NOTE — Telephone Encounter (Signed)
Patient states after she was treated for chlamydia on 09/16 she had sex that evening with same partner . Now she is having vaginal burning . Advised to come to office today at 3:00. Advised will need to be  No later than 4:30

## 2012-02-15 NOTE — Progress Notes (Signed)
Subjective:     Patient ID: Shannon Randolph, female   DOB: November 29, 1990, 21 y.o.   MRN: [id number]  HPI Vaginal discharge: Patient returns to the office today after her appointment on 5 days ago for STD panel. Her last appointment she tested positive for chlamydia, gonorrhea, BV, yeast and pregnancy. She was treated with flaygl, azithromycin slurry and diflucan. She then had sexual relations with the same man after treatment and is fearful she contracted an STD again. She admits to a brownish discharge, which started again yesterday. She also has been "tingling down there."   She denies foul smell or fever.  She has been instructed on Chlamydia and Gonorrhea. We discussed her making sure he is cured and she is cured before having sexual contact again with this partner. She seems to understand.  Review of Systems See above HPI    Objective:   Physical Exam Gen: NAD. Alert.  Heart: RRR. No murmur Lungs: CTAB GU:    - Wet mount obtained.    - No lesions, ulcerations, or discharge noted.

## 2012-02-15 NOTE — Assessment & Plan Note (Addendum)
-   Recurrent contact with partner prior to his treatment. - Recurrent discharge - Wet prep today; will not test for G&C today, to early from ABX coverage.  - Return to office in 2 weeks for test of cure from first treatment on 9/16.

## 2012-02-18 ENCOUNTER — Other Ambulatory Visit: Payer: Self-pay | Admitting: Emergency Medicine

## 2012-02-18 ENCOUNTER — Ambulatory Visit (HOSPITAL_COMMUNITY): Payer: Medicaid Other

## 2012-02-18 ENCOUNTER — Ambulatory Visit (HOSPITAL_COMMUNITY)
Admission: RE | Admit: 2012-02-18 | Discharge: 2012-02-18 | Disposition: A | Discharge status: Home / Self Care | Payer: Medicaid Other | Source: Ambulatory Visit | Attending: Family Medicine | Admitting: Family Medicine

## 2012-02-18 DIAGNOSIS — Z3490 Encounter for supervision of normal pregnancy, unspecified, unspecified trimester: Secondary | ICD-10-CM

## 2012-02-18 DIAGNOSIS — Z3689 Encounter for other specified antenatal screening: Secondary | ICD-10-CM | POA: Insufficient documentation

## 2012-02-19 ENCOUNTER — Encounter: Payer: Self-pay | Admitting: Emergency Medicine

## 2012-02-19 NOTE — Progress Notes (Signed)
Patient ID: Shannon Randolph, female   DOB: 1990-12-30, 21 y.o.   MRN: [id number] Reviewed prenatal ultrasound.  EGA [redacted]w[redacted]d on 02/18/2012.  EDD of 09/22/2012.  New OB appt scheduled for 03/07/12.

## 2012-02-29 ENCOUNTER — Other Ambulatory Visit: Payer: Medicaid Other

## 2012-02-29 ENCOUNTER — Telehealth: Payer: Self-pay | Admitting: *Deleted

## 2012-02-29 DIAGNOSIS — Z3490 Encounter for supervision of normal pregnancy, unspecified, unspecified trimester: Secondary | ICD-10-CM

## 2012-02-29 NOTE — Progress Notes (Signed)
Shannon Randolph Prenatal labs

## 2012-02-29 NOTE — Telephone Encounter (Signed)
Letter written and placed up front for pick up  

## 2012-02-29 NOTE — Telephone Encounter (Signed)
Attempted call back to number provided  But voicemail has not been set up message states.

## 2012-02-29 NOTE — Telephone Encounter (Signed)
Patient in office today for prenatal labs. States  she is going to classes but having problem with morning sickness frequently and needs a note from  PCP  about this so she isn't dropped from class.  States she usually has to throw up before class and then between classes has to go to bathroom to throw up.  Encouraged her to try sipping on clear liquid early  in AM and nibbling on dry toast , crackers and then later on  In morning try to increase diet. Will send message to Dr. Elwyn Reach . Call # [phone].  Has new OB appointment next Friday 10/11 but needs note now.

## 2012-03-01 LAB — OBSTETRIC PANEL
Antibody Screen: NEGATIVE
Basophils Absolute: 0 10*3/uL (ref 0.0–0.1)
Basophils Relative: 0 % (ref 0–1)
Eosinophils Absolute: 0.1 10*3/uL (ref 0.0–0.7)
Eosinophils Relative: 1 % (ref 0–5)
Hepatitis B Surface Ag: NEGATIVE
Lymphs Abs: 1.5 10*3/uL (ref 0.7–4.0)
MCH: 27.8 pg (ref 26.0–34.0)
Neutrophils Relative %: 74 % (ref 43–77)
Platelets: 228 10*3/uL (ref 150–400)
RBC: 4.03 MIL/uL (ref 3.87–5.11)
RDW: 13.2 % (ref 11.5–15.5)
Rh Type: POSITIVE
Rubella: 29.9 IU/mL — ABNORMAL HIGH

## 2012-03-03 NOTE — Telephone Encounter (Signed)
Attempted call again and message states " the  #  you are trying to call is not reachable ". Will await call back from patient.

## 2012-03-07 ENCOUNTER — Other Ambulatory Visit (HOSPITAL_COMMUNITY)
Admission: RE | Admit: 2012-03-07 | Discharge: 2012-03-07 | Disposition: A | Discharge status: Home / Self Care | Payer: Medicaid Other | Source: Ambulatory Visit | Attending: Family Medicine | Admitting: Family Medicine

## 2012-03-07 ENCOUNTER — Ambulatory Visit: Payer: Medicaid Other | Admitting: Emergency Medicine

## 2012-03-07 ENCOUNTER — Encounter: Payer: Self-pay | Admitting: Emergency Medicine

## 2012-03-07 VITALS — BP 110/70 | Wt 116.5 lb

## 2012-03-07 DIAGNOSIS — Z3490 Encounter for supervision of normal pregnancy, unspecified, unspecified trimester: Secondary | ICD-10-CM

## 2012-03-07 DIAGNOSIS — Z113 Encounter for screening for infections with a predominantly sexual mode of transmission: Secondary | ICD-10-CM | POA: Insufficient documentation

## 2012-03-07 MED ORDER — DOXYLAMINE SUCCINATE (SLEEP) 25 MG PO TABS: 25.0000 mg | ORAL_TABLET | Freq: Every day | ORAL | Status: DC

## 2012-03-07 MED ORDER — PRENATAL VIT-FE FUMARATE-FA 29-1 MG PO CHEW: 1.0000 | CHEWABLE_TABLET | Freq: Every day | ORAL | Status: DC

## 2012-03-07 MED ORDER — VITAMIN B-6 25 MG PO TABS: 25.0000 mg | ORAL_TABLET | Freq: Three times a day (TID) | ORAL | Status: DC

## 2012-03-07 NOTE — Progress Notes (Signed)
S: 21 year old G2P1001 at [redacted]w[redacted]d by early ultrasound coming in for new OB appointment.  Denies abdominal pain, cramping, vaginal bleeding or discharge.  Continues to have significant nausea with some vomiting.  She has been taking the prenatal vitamins, but has not been taking B6.  States her appetite is good, but when the food is in front of her she gets nauseous.  Nausea is worse than the vomiting.  States had a lot of nausea with her first pregnancy as well.  Chlamydia positive last month, received treatment.  Partner told her he had been tested and treated.  Not currently sexual active.  Currently participating in yoga twice week; informed instructors of pregnancy, does have some dizziness, especially with standing after being bent over.  I have reviewed and updated the following as appropriate: allergies, current medications, past family history, past medical history, past social history, past surgical history and problem list.  Former smoker (marijuana), but stopped prior to pregnancy.  Risk screening form: negative PHQ-9: 3 (1 for feeling down, 2 for fatigue)  O: see flowsheet Gen: alert, cooperative, NAD, appear tired HEENT: AT/[place], sclera white, PERRL, MMM, no pharyngeal erythema or exudate Neck: supple, no LAD, thyroid normal CV: RRR, no murmurs Pulm: CTAB, no wheezes or rales Abd: +BS, soft, NTND Pelvic: normal external genitalia, no vagina, thin white discharge present unclear if coming from cervix, cervix normal, no CMT Ext: no edema, 2+ DP and radial pulses Neuro: grossly normal  A/P: 21 yo G2P1001 at [redacted]w[redacted]d - continue prenatal vitamin - prenatal labs reviewed and wnl - B6 and doxylamine for nausea; if continues will likely give zofran for prn use - discussed weight loss - she is to return in 1-2 weeks for a weight check - Interested in genetic screening - will get quad screen at 15 weeks - planning on [facility] for pediatrician - unsure of birth control following pregnancy - no  indication for early glucola - hx of chlamydia, treated 3 weeks ago, test of cure collected today

## 2012-03-07 NOTE — Patient Instructions (Signed)
Vaginal Bleeding During Pregnancy, First Trimester  A small amount of bleeding (spotting) is relatively common in early pregnancy. It usually stops on its own. There are many causes for bleeding or spotting in early pregnancy. Some bleeding may be related to the pregnancy and some may not. Cramping with the bleeding is more serious and concerning. Tell your caregiver if you have any vaginal bleeding.   CAUSES    It is normal in most cases.   The pregnancy ends (miscarriage).   The pregnancy may end (threatened miscarriage).   Infection or inflammation of the cervix.   Growths (polyps) on the cervix.   Pregnancy happens outside of the uterus and in a fallopian tube (tubal pregnancy).   Many tiny cysts in the uterus instead of pregnancy tissue (molar pregnancy).  SYMPTOMS   Vaginal bleeding or spotting with or without cramps.  DIAGNOSIS   To evaluate the pregnancy, your caregiver may:   Do a pelvic exam.   Take blood tests.   Do an ultrasound.  It is very important to follow your caregiver's instructions.   TREATMENT    Evaluation of the pregnancy with blood tests and ultrasound.   Bed rest (getting up to use the bathroom only).   Rho-gam immunization if the mother is Rh negative and the father is Rh positive.  HOME CARE INSTRUCTIONS    If your caregiver orders bed rest, you may need to make arrangements for the care of other children and for other responsibilities. However, your caregiver may allow you to continue light activity.   Keep track of the number of pads you use each day, how often you change pads and how soaked (saturated) they are. Write this down.   Do not use tampons. Do not douche.   Do not have sexual intercourse or orgasms until approved by your physician.   Save any tissue that you pass for your caregiver to see.   Take medicine for cramps only with your caregiver's permission.   Do not take aspirin because it can make you bleed.  SEEK IMMEDIATE MEDICAL CARE IF:    You  experience severe cramps in your stomach, back or belly (abdomen).   You have an oral temperature above 102 F (38.9 C), not controlled by medicine.   You pass large clots or tissue.   Your bleeding increases or you become light-headed, weak or have fainting episodes.   You develop chills.   You are leaking or have a gush of fluid from your vagina.   You pass out while having a bowel movement. That may mean you have a ruptured tubal pregnancy.  Document Released: 02/21/2005 Document Revised: 08/06/2011 Document Reviewed: 09/02/2008  [employer] Patient Information 2013 [employer].

## 2012-03-14 ENCOUNTER — Telehealth: Payer: Self-pay | Admitting: Emergency Medicine

## 2012-03-14 NOTE — Telephone Encounter (Signed)
Called pt 'the number you are trying to call is not reachable'.Arlyss Repress

## 2012-03-14 NOTE — Telephone Encounter (Signed)
Pt called back and I informed her of test results. Lorenda Hatchet, Renato Battles

## 2012-03-14 NOTE — Telephone Encounter (Signed)
Pt is asking for results of labs from last Monday

## 2012-03-24 ENCOUNTER — Telehealth: Payer: Self-pay | Admitting: Emergency Medicine

## 2012-03-24 NOTE — Telephone Encounter (Signed)
Patient is calling because Medicaid is not paying for any of the medications that have been prescribed.

## 2012-03-24 NOTE — Telephone Encounter (Signed)
Called pt. She reports, that the pharmacy has tried to reach Dr.[name] re: meds. She also said, that medicaid would cover the 'regular prenatal vitamins'. She wanted to speak with Dr.[name] about something personal and I told her, that she needs to make an appt to see Dr.[name]. She request to have a statement for her probation officer to take off some hours for her community service, because she is always sick due to her pregnancy. Again, I advised the pt to schedule OV. She agreed and will have someone for transportation to our office. Will fwd. To Dr.[name] to send prenatal vitamins to her pharmacy. Lorenda Hatchet, Renato Battles

## 2012-03-28 ENCOUNTER — Encounter: Payer: Medicaid Other | Admitting: Family Medicine

## 2012-04-07 ENCOUNTER — Telehealth: Payer: Self-pay | Admitting: Emergency Medicine

## 2012-04-07 NOTE — Telephone Encounter (Signed)
Returned call to patient.  Patient's main concern is not being able to afford her prenatal vitamins.  Medicaid does not cover chewable tabs.  Patient has recently been on a prenatal vitamin that was covered before the chewable tabs were prescribed.  Wants to change back to this one.  Per [facility]---patient was on Prenatal plus Fe and it is covered by Medicaid.  They will change Rx.  Patient informed.  Discussed wanting to change to different PCP for prenatal care.  Patient states she does not see her doctor enough due to Dr. Jonah Blue availability and needs to see her about an issue that she called and spoke with Renato Battles about (note for probation officer to reduce community service due to pregnancy).  Explained prenatal visits---once every four weeks up to [redacted] weeks gestation, every two weeks until 36 weeks, then weekly thereafter.  Last OB appt was 03/07/12.  Dr. Elwyn Reach not in office until 04/21/12 and patient has an appt on that day.  Patient "ok" with having Dr. Elwyn Reach continue to follow her OB care and will discuss her issue at that appt.  Will route note to Dr. Elwyn Reach.  Gaylene Brooks, RN

## 2012-04-07 NOTE — Telephone Encounter (Signed)
Pt hasn't heard anything about getting her prenatal vitamins that medicaid will pay for.  Also is asking to start seeing Dr Madolyn Frieze for her prenatal care and not Dr Elwyn Reach "because she's never there" - wants to hear from someone today.  (mom was in the background talking loudly and telling her what to say)

## 2012-04-21 ENCOUNTER — Other Ambulatory Visit (HOSPITAL_COMMUNITY)
Admission: RE | Admit: 2012-04-21 | Discharge: 2012-04-21 | Disposition: A | Discharge status: Home / Self Care | Payer: Medicaid Other | Source: Ambulatory Visit | Attending: Family Medicine | Admitting: Family Medicine

## 2012-04-21 ENCOUNTER — Ambulatory Visit (INDEPENDENT_AMBULATORY_CARE_PROVIDER_SITE_OTHER): Payer: Medicaid Other | Admitting: Emergency Medicine

## 2012-04-21 VITALS — BP 124/79 | Wt 128.0 lb

## 2012-04-21 DIAGNOSIS — Z113 Encounter for screening for infections with a predominantly sexual mode of transmission: Secondary | ICD-10-CM | POA: Insufficient documentation

## 2012-04-21 DIAGNOSIS — Z23 Encounter for immunization: Secondary | ICD-10-CM

## 2012-04-21 DIAGNOSIS — Z3490 Encounter for supervision of normal pregnancy, unspecified, unspecified trimester: Secondary | ICD-10-CM

## 2012-04-21 DIAGNOSIS — Z3480 Encounter for supervision of other normal pregnancy, unspecified trimester: Secondary | ICD-10-CM

## 2012-04-21 LAB — POCT WET PREP (WET MOUNT): Clue Cells Wet Prep Whiff POC: POSITIVE

## 2012-04-21 MED ORDER — AZITHROMYCIN 500 MG PO TABS: 1000.0000 mg | ORAL_TABLET | Freq: Once | ORAL | Status: DC

## 2012-04-21 MED ORDER — METRONIDAZOLE 500 MG PO TABS: 500.0000 mg | ORAL_TABLET | Freq: Two times a day (BID) | ORAL | Status: DC

## 2012-04-21 NOTE — Progress Notes (Signed)
S: 21 yo G2P1001 at [redacted]w[redacted]d by early ultrasound.  Doing well today.  Does have some vaginal discharge.  Described as thick and creamy with some odor.  No vaginal itching.  Has been sexually active with FOB recently after prolonged hiatus.  Concerned about getting something from him as he gave her chlamydia previously.  No abdominal pain or cramping.  No vaginal bleeding.  + FM.  Nausea is still present but much improved.  Emesis 3x/week.  Taking prenatal vitamin.   O: see flowsheet Pelvic: normal external genitalia, normal vagina, thick white-green discharge present, normal cervix, no CMT  A/P: 21 yo G2P1001 at [redacted]w[redacted]d - continue prenatal vitamin - wet prep and GC/chlamydia collected today - positive for BV, will treat with flagyl and azithromycin for presumed chlamydia - weight much improved from previous appt, will continue to monitor - anatomy scan ordered today - flu shot today - reviewed bleeding precautions - f/u in OB clinic in 4 weeks

## 2012-04-21 NOTE — Patient Instructions (Addendum)
It was good to see you. I will let you know the results of your tests as soon as I get them back. I sent a prescription for flagyl and azithromycin to the pharmacy.  Please complete these antibiotics.  Your boyfriend and go to the health department to get tested and treated for free.  Until he shows you proof of this, avoid sex with him.  If you have any vaginal bleeding or cramping, please go to [facility] to be evaluated.  Follow up in OB Clinic in 4 weeks.

## 2012-04-23 ENCOUNTER — Ambulatory Visit (HOSPITAL_COMMUNITY)
Admission: RE | Admit: 2012-04-23 | Discharge: 2012-04-23 | Disposition: A | Discharge status: Home / Self Care | Payer: Medicaid Other | Source: Ambulatory Visit | Attending: Family Medicine | Admitting: Family Medicine

## 2012-04-23 ENCOUNTER — Telehealth: Payer: Self-pay | Admitting: Emergency Medicine

## 2012-04-23 DIAGNOSIS — Z1389 Encounter for screening for other disorder: Secondary | ICD-10-CM | POA: Insufficient documentation

## 2012-04-23 DIAGNOSIS — Z363 Encounter for antenatal screening for malformations: Secondary | ICD-10-CM | POA: Insufficient documentation

## 2012-04-23 DIAGNOSIS — Z3490 Encounter for supervision of normal pregnancy, unspecified, unspecified trimester: Secondary | ICD-10-CM

## 2012-04-23 DIAGNOSIS — O358XX0 Maternal care for other (suspected) fetal abnormality and damage, not applicable or unspecified: Secondary | ICD-10-CM | POA: Insufficient documentation

## 2012-04-23 NOTE — Telephone Encounter (Signed)
Called and spoke to patient regarding her lab results.  GC/Chlamydia were negative.   She has picked up the azithromycin, but has not taken it yet.  Discussed that she does not need to take it, but if she already paid for it, it is okay to take.

## 2012-05-12 ENCOUNTER — Encounter (HOSPITAL_COMMUNITY): Payer: Self-pay | Admitting: Emergency Medicine

## 2012-05-12 ENCOUNTER — Emergency Department (HOSPITAL_COMMUNITY)
Admission: EM | Admit: 2012-05-12 | Discharge: 2012-05-13 | Disposition: A | Discharge status: Home / Self Care | Payer: Medicaid Other | Attending: Emergency Medicine | Admitting: Emergency Medicine

## 2012-05-12 DIAGNOSIS — N390 Urinary tract infection, site not specified: Secondary | ICD-10-CM

## 2012-05-12 DIAGNOSIS — Z87891 Personal history of nicotine dependence: Secondary | ICD-10-CM | POA: Insufficient documentation

## 2012-05-12 DIAGNOSIS — O99891 Other specified diseases and conditions complicating pregnancy: Secondary | ICD-10-CM

## 2012-05-12 DIAGNOSIS — O2390 Unspecified genitourinary tract infection in pregnancy, unspecified trimester: Secondary | ICD-10-CM | POA: Insufficient documentation

## 2012-05-12 DIAGNOSIS — M549 Dorsalgia, unspecified: Secondary | ICD-10-CM

## 2012-05-12 NOTE — ED Notes (Signed)
Pt c/o abdominal pain,back pain x 8 hours.pt sts baby still very active.pain rated 8/10 epigastric, pt c/o pain and nummness down right leg with back pain. VSS.skin PWD.

## 2012-05-13 LAB — CBC WITH DIFFERENTIAL/PLATELET
Basophils Absolute: 0 10*3/uL (ref 0.0–0.1)
Basophils Relative: 0 % (ref 0–1)
HCT: 29.4 % — ABNORMAL LOW (ref 36.0–46.0)
MCHC: 36.4 g/dL — ABNORMAL HIGH (ref 30.0–36.0)
Monocytes Absolute: 1.1 10*3/uL — ABNORMAL HIGH (ref 0.1–1.0)
Neutro Abs: 8.9 10*3/uL — ABNORMAL HIGH (ref 1.7–7.7)
Neutrophils Relative %: 66 % (ref 43–77)
RDW: 12.7 % (ref 11.5–15.5)

## 2012-05-13 LAB — URINALYSIS, ROUTINE W REFLEX MICROSCOPIC
Glucose, UA: NEGATIVE mg/dL
Ketones, ur: NEGATIVE mg/dL
Specific Gravity, Urine: 1.021 (ref 1.005–1.030)
pH: 6.5 (ref 5.0–8.0)

## 2012-05-13 LAB — URINE MICROSCOPIC-ADD ON

## 2012-05-13 LAB — COMPREHENSIVE METABOLIC PANEL
AST: 12 U/L (ref 0–37)
Albumin: 2.6 g/dL — ABNORMAL LOW (ref 3.5–5.2)
Chloride: 100 mEq/L (ref 96–112)
Creatinine, Ser: 0.56 mg/dL (ref 0.50–1.10)
Total Bilirubin: 0.2 mg/dL — ABNORMAL LOW (ref 0.3–1.2)

## 2012-05-13 MED ORDER — NITROFURANTOIN MONOHYD MACRO 100 MG PO CAPS: 100.0000 mg | ORAL_CAPSULE | Freq: Two times a day (BID) | ORAL | Status: DC

## 2012-05-13 MED ORDER — ONDANSETRON 8 MG PO TBDP
8.0000 mg | ORAL_TABLET | Freq: Once | ORAL | Status: AC
  Administered 2012-05-13: 8 mg via ORAL
  Filled 2012-05-13: qty 1

## 2012-05-13 MED ORDER — ACETAMINOPHEN 325 MG PO TABS
650.0000 mg | ORAL_TABLET | Freq: Once | ORAL | Status: AC
  Administered 2012-05-13: 650 mg via ORAL
  Filled 2012-05-13: qty 2

## 2012-05-13 NOTE — ED Provider Notes (Signed)
Medical screening examination/treatment/procedure(s) were performed by non-physician practitioner and as supervising physician I was immediately available for consultation/collaboration.  Hjalmar Ballengee, MD 05/13/12 0532 

## 2012-05-13 NOTE — ED Notes (Signed)
Spoke with lab to acquire ab UA sent down at 0208. Lab explained that they had a spinal that took precidence before the urine and that it was in process.

## 2012-05-13 NOTE — ED Provider Notes (Signed)
History     CSN: [id number]  Arrival date & time 05/12/12  2316   First MD Initiated Contact with Patient 05/12/12 2346      Chief Complaint  Patient presents with  . Back Pain   HPI  History provided by the patient. Patient is a 21 year old female G2 P1 currently [redacted] weeks pregnant who presents with multiple complaints. Patient reports having continued low back pain that radiates to right lower leg with some occasional numbness and tingling. Patient reports having these symptoms for the past several weeks during her pregnancy. She has discussed this with her OB/GYN provider. Patient also presents today with new epigastric abdominal pain and discomfort. Symptoms began around 2 PM 1-2 hours after eating lunch. Patient denies having similar symptoms previously. Patient has not used any treatment for symptoms. They have been persistent. Pain is a sharp burning type pain. She denies any other associated symptoms. Denies any nausea vomiting. Denies any increased Chang. Denies any sour burning sensation in chest or throat. Patient reports having normal fetal movements today. She denies any vaginal bleeding or vaginal discharge.    Past Medical History  Diagnosis Date  . No pertinent past medical history     Past Surgical History  Procedure Date  . No past surgeries     No family history on file.  History  Substance Use Topics  . Smoking status: Former Smoker    Quit date: 01/11/2012  . Smokeless tobacco: Former Neurosurgeon    Quit date: 01/11/2012  . Alcohol Use: No    OB History    Grav Para Term Preterm Abortions TAB SAB Ect Mult Living   2 1 1       1       Review of Systems  Constitutional: Negative for fever and chills.  Respiratory: Negative for cough and shortness of breath.   Cardiovascular: Negative for chest pain.  Gastrointestinal: Positive for abdominal pain. Negative for nausea, vomiting, diarrhea and constipation.  Genitourinary: Negative for vaginal bleeding and  vaginal discharge.  Musculoskeletal: Positive for back pain.  All other systems reviewed and are negative.    Allergies  Review of patient's allergies indicates no known allergies.  Home Medications   Current Outpatient Rx  Name  Route  Sig  Dispense  Refill  . PRENATAL MULTIVITAMIN CH   Oral   Take 1 tablet by mouth daily.           BP 117/56  Pulse 81  Temp 97.9 F (36.6 C) (Oral)  Resp 20  SpO2 100%  LMP 12/11/2011  Physical Exam  Nursing note and vitals reviewed. Constitutional: She is oriented to person, place, and time. She appears well-developed and well-nourished. No distress.  HENT:  Head: Normocephalic.  Cardiovascular: Normal rate and regular rhythm.   Pulmonary/Chest: Effort normal and breath sounds normal. No respiratory distress. She has no wheezes. She has no rales.  Abdominal: Soft. There is tenderness in the epigastric area. There is no rebound, no guarding, no tenderness at McBurney's point and negative Murphy's sign.       Gravid. Mild epigastric tenderness.  Neurological: She is alert and oriented to person, place, and time.  Skin: Skin is warm and dry. No rash noted.  Psychiatric: She has a normal mood and affect. Her behavior is normal.    ED Course  Procedures   Results for orders placed during the hospital encounter of 05/12/12  CBC WITH DIFFERENTIAL      Component Value Range  WBC 13.6 (*) 4.0 - 10.5 K/uL   RBC 3.55 (*) 3.87 - 5.11 MIL/uL   Hemoglobin 10.7 (*) 12.0 - 15.0 g/dL   HCT 16.1 (*) 09.6 - 04.5 %   MCV 82.8  78.0 - 100.0 fL   MCH 30.1  26.0 - 34.0 pg   MCHC 36.4 (*) 30.0 - 36.0 g/dL   RDW 40.9  81.1 - 91.4 %   Platelets 177  150 - 400 K/uL   Neutrophils Relative 66  43 - 77 %   Neutro Abs 8.9 (*) 1.7 - 7.7 K/uL   Lymphocytes Relative 24  12 - 46 %   Lymphs Abs 3.3  0.7 - 4.0 K/uL   Monocytes Relative 8  3 - 12 %   Monocytes Absolute 1.1 (*) 0.1 - 1.0 K/uL   Eosinophils Relative 2  0 - 5 %   Eosinophils Absolute 0.2   0.0 - 0.7 K/uL   Basophils Relative 0  0 - 1 %   Basophils Absolute 0.0  0.0 - 0.1 K/uL  COMPREHENSIVE METABOLIC PANEL      Component Value Range   Sodium 133 (*) 135 - 145 mEq/L   Potassium 3.6  3.5 - 5.1 mEq/L   Chloride 100  96 - 112 mEq/L   CO2 25  19 - 32 mEq/L   Glucose, Bld 85  70 - 99 mg/dL   BUN 8  6 - 23 mg/dL   Creatinine, Ser 7.82  0.50 - 1.10 mg/dL   Calcium 8.6  8.4 - 95.6 mg/dL   Total Protein 5.6 (*) 6.0 - 8.3 g/dL   Albumin 2.6 (*) 3.5 - 5.2 g/dL   AST 12  0 - 37 U/L   ALT 9  0 - 35 U/L   Alkaline Phosphatase 68  39 - 117 U/L   Total Bilirubin 0.2 (*) 0.3 - 1.2 mg/dL   GFR calc non Af Amer >90  >90 mL/min   GFR calc Af Amer >90  >90 mL/min  LIPASE, BLOOD      Component Value Range   Lipase 36  11 - 59 U/L  URINALYSIS, ROUTINE W REFLEX MICROSCOPIC      Component Value Range   Color, Urine YELLOW  YELLOW   APPearance CLOUDY (*) CLEAR   Specific Gravity, Urine 1.021  1.005 - 1.030   pH 6.5  5.0 - 8.0   Glucose, UA NEGATIVE  NEGATIVE mg/dL   Hgb urine dipstick NEGATIVE  NEGATIVE   Bilirubin Urine NEGATIVE  NEGATIVE   Ketones, ur NEGATIVE  NEGATIVE mg/dL   Protein, ur NEGATIVE  NEGATIVE mg/dL   Urobilinogen, UA 1.0  0.0 - 1.0 mg/dL   Nitrite NEGATIVE  NEGATIVE   Leukocytes, UA LARGE (*) NEGATIVE  URINE MICROSCOPIC-ADD ON      Component Value Range   Squamous Epithelial / LPF MANY (*) RARE   WBC, UA 7-10  <3 WBC/hpf   Bacteria, UA MANY (*) RARE   Urine-Other AMORPHOUS URATES/PHOSPHATES          1. UTI (lower urinary tract infection)   2. Back pain in pregnancy       MDM  12:20AM she seen and evaluated. Patient appears well in no acute distress.   bedside ultrasound performed showing active fetus with normal heart rate.  Patient is to do well and resting comfortably in bed at this time. Reports having some improvement of symptoms. Patient also reports she's ready to return home. UA is still pending.  UA was signs concerning for possible UTI.  Will Give antibiotics and instructed patient on OB/GYN followup.  She agrees with this plan.      Angus Seller, [place] 05/13/12 [phone]

## 2012-05-28 NOTE — L&D Delivery Note (Signed)
Attended birth and agree with Dr. Jonah Blue note w/ the exception of shoulder dystocia lasted only approximately 45-60sec.  Mom breast/bottlefeeding, mirena for contraception.  Marge Duncans

## 2012-05-28 NOTE — L&D Delivery Note (Signed)
Delivery Note At 8:12 PM a viable female was delivered via Vaginal, Spontaneous Delivery (Presentation: OA;  ).  Shoulder dystocia lasting 90 seconds, resolved with McRoberts, suprapubic pressure and hooking of posterior axilla.  APGAR: 9, 9; weight pending.   Placenta status: spontaneous, intact.  Cord: 3 vessels with the following complications: None.  Cord pH: n/a  Anesthesia: Epidural  Episiotomy: None Lacerations: None Suture Repair: n/a Est. Blood Loss (mL): 300  Mom to postpartum.  Baby to nursery-stable.  [name], Earlean Fidalgo 09/20/2012, 8:38 PM

## 2012-06-06 ENCOUNTER — Ambulatory Visit (INDEPENDENT_AMBULATORY_CARE_PROVIDER_SITE_OTHER): Payer: Medicaid Other | Admitting: Emergency Medicine

## 2012-06-06 VITALS — BP 104/69 | Wt 142.0 lb

## 2012-06-06 DIAGNOSIS — N898 Other specified noninflammatory disorders of vagina: Secondary | ICD-10-CM

## 2012-06-06 LAB — POCT WET PREP (WET MOUNT): Clue Cells Wet Prep Whiff POC: POSITIVE

## 2012-06-06 MED ORDER — OMEPRAZOLE 20 MG PO CPDR: 20.0000 mg | DELAYED_RELEASE_CAPSULE | Freq: Every day | ORAL | Status: DC | PRN

## 2012-06-06 MED ORDER — METRONIDAZOLE 500 MG PO TABS: 500.0000 mg | ORAL_TABLET | Freq: Two times a day (BID) | ORAL | Status: DC

## 2012-06-06 NOTE — Progress Notes (Signed)
S: 22 yo G2P1001 at [redacted]w[redacted]d by early ultrasound.  Complaining today of some intermittent foul smelling vaginal discharge without itching or pain.  No vaginal bleeding.  + FM.  No urinary symptoms.  Does have daily low back pain that makes her right leg stiff and sore.  Has not really tried anything for this.  O: see flowsheet Vaginal: normal external genitalia with small amount of thin white discharge at introitus Gait: normal  A/P: 22 yo G2P1001 - continue prenatal vitamin - wet prep + for clue cells: will treat with flagyl  **unable to reach patient with this information.  Will likely need to address at follow up.  Prescription for flagyl sent. - omeprazole 20mg  daily prn for heartburn - conservative measures of heating pad and tylenol for back/leg pain - anatomy reviewed with patient and wnl - f/u in 4 weeks for 28 week labs and glucola

## 2012-06-06 NOTE — Patient Instructions (Addendum)
Follow up in 4 weeks.  We will be doing some labs at that visit, including a 1 hour glucola test so plan on being here for at least 1 hour.  If you have any bleeding or cramping or stop feeling the baby move, go to [facility].

## 2012-07-21 ENCOUNTER — Ambulatory Visit (INDEPENDENT_AMBULATORY_CARE_PROVIDER_SITE_OTHER): Payer: Medicaid Other | Admitting: Emergency Medicine

## 2012-07-21 VITALS — BP 108/66 | Wt 149.0 lb

## 2012-07-21 DIAGNOSIS — Z23 Encounter for immunization: Secondary | ICD-10-CM

## 2012-07-21 DIAGNOSIS — Z3480 Encounter for supervision of other normal pregnancy, unspecified trimester: Secondary | ICD-10-CM

## 2012-07-21 DIAGNOSIS — B9689 Other specified bacterial agents as the cause of diseases classified elsewhere: Secondary | ICD-10-CM

## 2012-07-21 DIAGNOSIS — N760 Acute vaginitis: Secondary | ICD-10-CM

## 2012-07-21 DIAGNOSIS — Z3493 Encounter for supervision of normal pregnancy, unspecified, third trimester: Secondary | ICD-10-CM

## 2012-07-21 DIAGNOSIS — Z3483 Encounter for supervision of other normal pregnancy, third trimester: Secondary | ICD-10-CM

## 2012-07-21 MED ORDER — METRONIDAZOLE 500 MG PO TABS: 500.0000 mg | ORAL_TABLET | Freq: Two times a day (BID) | ORAL | Status: DC

## 2012-07-21 NOTE — Progress Notes (Signed)
S: 22 yo G2P1001 at [redacted]w[redacted]d by 9w ultrasound.  Continues to have right sided back and leg pain.  Sometimes interferes with walking.  Comes and goes.  Has not taken anything for this.  +FM.  Vaginal discharge unchanged - never got message about BV and flagyl.  No vaginal bleeding.  No contractions.  O: see flowsheet Back: no erythema or step offs; mildly tender to palpation along right back Neuro: 5/5 strength in bilateral lower extremities, patellar reflex 2+ and symmetric  A/P: 22 yo G2P1001 at [redacted]w[redacted]d by early ultrasound - continue prenatal vitamin - needs 28 week labs - will return for these tomorrow - TDaP today - discussed tylenol, support belt, heating pad, and benadryl to treat back pain - will fill flagyl prescription for BV - preterm labor precautions reviewed - f/u in 2 weeks

## 2012-07-21 NOTE — Patient Instructions (Addendum)
It was nice to see you! Take tylenol extra strength 2 tablets up to 3 times a day for your back pain. You can also use a heating pad for 15 minutes up to 3 times a day. Benadryl is okay to take at bedtime to help you sleep.  If you have regular contractions that are 3-5 minutes apart for at least one hour, bleeding or fluid from your vagina, or you do not feel the baby moving enough, please go to the [facility].  Follow up in 2 weeks.

## 2012-07-23 ENCOUNTER — Other Ambulatory Visit (INDEPENDENT_AMBULATORY_CARE_PROVIDER_SITE_OTHER): Payer: Medicaid Other

## 2012-07-23 DIAGNOSIS — Z3480 Encounter for supervision of other normal pregnancy, unspecified trimester: Secondary | ICD-10-CM

## 2012-07-23 DIAGNOSIS — Z3483 Encounter for supervision of other normal pregnancy, third trimester: Secondary | ICD-10-CM

## 2012-07-23 LAB — CBC WITH DIFFERENTIAL/PLATELET
Eosinophils Relative: 1 % (ref 0–5)
HCT: 31.6 % — ABNORMAL LOW (ref 36.0–46.0)
Hemoglobin: 10.9 g/dL — ABNORMAL LOW (ref 12.0–15.0)
Lymphocytes Relative: 12 % (ref 12–46)
Lymphs Abs: 2.3 10*3/uL (ref 0.7–4.0)
MCV: 82.5 fL (ref 78.0–100.0)
Monocytes Absolute: 1.5 10*3/uL — ABNORMAL HIGH (ref 0.1–1.0)
Monocytes Relative: 7 % (ref 3–12)
RBC: 3.83 MIL/uL — ABNORMAL LOW (ref 3.87–5.11)
WBC: 19.9 10*3/uL — ABNORMAL HIGH (ref 4.0–10.5)

## 2012-07-23 NOTE — Progress Notes (Signed)
CBC WITH DIFF,HIV,RPR AND 1 HR GTT Shataya Winkles

## 2012-08-07 ENCOUNTER — Ambulatory Visit (INDEPENDENT_AMBULATORY_CARE_PROVIDER_SITE_OTHER): Payer: Medicaid Other | Admitting: Emergency Medicine

## 2012-08-07 VITALS — BP 106/70 | Wt 148.0 lb

## 2012-08-07 DIAGNOSIS — Z3480 Encounter for supervision of other normal pregnancy, unspecified trimester: Secondary | ICD-10-CM

## 2012-08-07 DIAGNOSIS — Z3493 Encounter for supervision of normal pregnancy, unspecified, third trimester: Secondary | ICD-10-CM

## 2012-08-07 MED ORDER — PRENATAL MULTIVITAMIN CH: 1.0000 | ORAL_TABLET | Freq: Every day | ORAL | Status: DC

## 2012-08-07 MED ORDER — ONDANSETRON HCL 4 MG PO TABS: 4.0000 mg | ORAL_TABLET | Freq: Three times a day (TID) | ORAL | Status: DC | PRN

## 2012-08-07 NOTE — Patient Instructions (Addendum)
If you feel like the baby is not moving as much as usual, please do "kick counts." To feel the baby, sit in a quiet place with your feet up, free of distractions. If you feel the baby kick 5 times in 1 hour, you can stop. If not, feel for a second hour. 10 kicks in 2 hours is very reassuring and makes [place] feel that the baby is doing well.  If you would like, you can dry drinking a small amount of caffeine before counting for the 2nd hour to help baby wake up.  If you do not feel 10 kicks in 2 hours while concentrating, please go to the MAU.   If you have regular contractions that are 3-5 minutes apart for at least one hour, bleeding or fluid from your vagina, or you do not feel the baby moving enough, please go to the [facility].  Follow up in 2 weeks in OB clinic.

## 2012-08-07 NOTE — Progress Notes (Signed)
S: 22 yo G2P1001 at [redacted]w[redacted]d by 9w ultrasound.  Continues to have some back and right leg pain, but it is manageable.  Over the last week, she has started having some nausea and vomiting.  Typically waking up nauseous and getting nauseous with food.  No fevers, chills, diarrhea, abdominal pain.  No sick contacts.  + FM.  Rare contractions that last for 10-15 minutes then go away.  No vaginal bleeding or discharge. Pediatrician: [facility] Feeding: Breast and bottle Pain plan: epidural  O: see flowsheet  A/P: 22 yo G2P1001 at [redacted]w[redacted]d by 9w ultrasound - continue prenatal vitamin - encouraged dietary iron given upset stomach - reviewed 28 week labs - wnl - zofran prn for n/v - passed 1hr GTT - labor precautions and kick counts reviewed - follow up in 2 weeks in OB clinic or sooner if n/v gets worse

## 2012-08-26 ENCOUNTER — Encounter: Payer: Medicaid Other | Admitting: Family Medicine

## 2012-08-27 ENCOUNTER — Encounter: Payer: Medicaid Other | Admitting: Family Medicine

## 2012-08-28 ENCOUNTER — Ambulatory Visit (INDEPENDENT_AMBULATORY_CARE_PROVIDER_SITE_OTHER): Payer: Medicaid Other | Admitting: Family Medicine

## 2012-08-28 ENCOUNTER — Other Ambulatory Visit (HOSPITAL_COMMUNITY)
Admission: RE | Admit: 2012-08-28 | Discharge: 2012-08-28 | Disposition: A | Discharge status: Home / Self Care | Payer: Medicaid Other | Source: Ambulatory Visit | Attending: Family Medicine | Admitting: Family Medicine

## 2012-08-28 VITALS — BP 110/71 | Wt 154.0 lb

## 2012-08-28 DIAGNOSIS — Z331 Pregnant state, incidental: Secondary | ICD-10-CM

## 2012-08-28 DIAGNOSIS — Z3480 Encounter for supervision of other normal pregnancy, unspecified trimester: Secondary | ICD-10-CM

## 2012-08-28 DIAGNOSIS — Z113 Encounter for screening for infections with a predominantly sexual mode of transmission: Secondary | ICD-10-CM | POA: Insufficient documentation

## 2012-08-28 DIAGNOSIS — Z3493 Encounter for supervision of normal pregnancy, unspecified, third trimester: Secondary | ICD-10-CM

## 2012-08-28 MED ORDER — DIPHENHYDRAMINE HCL 25 MG PO TABS: 25.0000 mg | ORAL_TABLET | Freq: Four times a day (QID) | ORAL | Status: DC | PRN

## 2012-08-28 NOTE — Assessment & Plan Note (Signed)
See episode A/P

## 2012-08-28 NOTE — Patient Instructions (Addendum)
Try diphenhydramine 25 mg every 4-6 hours as needed for nausea Continue Zofran as needed every 8 hours  Try to stay hydrated  Try ginger (tea, lollipops, ginger ale)  Follow-up tomorrow   Go to [facility] MAU if:  -Baby not moving well -Vaginal bleeding -Regular contractions every 5-10 minutes

## 2012-08-28 NOTE — Progress Notes (Signed)
21 YO G2P1001 36 3/7 weeks by 9 wk ultrasound presenting for routine prenatal care.  -She endorses persistent nausea, vomiting. She has been taking Zofran up to three times a day for the past 4 days; it slows down the vomiting but she endorses persistent nausea despite the medication; for the past 2 days, she has had persistent vomiting and is unable to keep food down, including water. -She had N/V earlier in her pregnancy which had resolved but now is severe again.  -She started having diarrhea 2 days ago and the vomiting worsened.   ROS: denies headaches; endorses contractions about every 2 minutes apart, stated a week ago; denies vaginal bleeding, abnormal vaginal discharge   O:  MOUTH: MMM SKIN: 2-3 sec capillary refill ABD: soft, mild lower abdominal tenderness, gravid GU: no cervical motion tenderness   A/P: 21 YO G2P1001 36 3/7 weeks by 9 wk ultrasound presenting for routine prenatal care. She endorses persistent nausea/vomiting.  -GBS culture and GC/Chlamydia today. Initial prenatal negative.  -Try Benadryl prn with Zofran prn for nausea/vomiting. She does not appear dehydrated at this time. Her baby appears stable. Follow-up tomorrow, before the weekend.  -Irregular contractions. 1.5 cm dilated.  -[facility]

## 2012-08-29 ENCOUNTER — Ambulatory Visit (INDEPENDENT_AMBULATORY_CARE_PROVIDER_SITE_OTHER): Payer: Medicaid Other | Admitting: Family Medicine

## 2012-08-29 VITALS — BP 105/69 | Wt 156.0 lb

## 2012-08-29 DIAGNOSIS — IMO0002 Reserved for concepts with insufficient information to code with codable children: Secondary | ICD-10-CM

## 2012-08-29 MED ORDER — PROMETHAZINE HCL 12.5 MG PO TABS: 12.5000 mg | ORAL_TABLET | Freq: Three times a day (TID) | ORAL | Status: DC | PRN

## 2012-08-29 NOTE — Progress Notes (Signed)
21 YOF G2P1001 36 4/7 wks presenting for follow-up of nausea/vomiting. She does not report significant improvement with Benadryl. She still reports she is not able to keep food down and is only occasionally able to keep liquids down. She reports good fetal movement.   O:  GEN: NAD MOUTH: MMM SKIN: 2-3 sec capillary refill CV: RRR  A/P: -For size less than dates: check follow-up ultrasound for growth and EFW -Phenergan 25 q 6 hours prn; continue Zofran prn -Consider adding Reglan -Encourage hydration, PO as tolerated -If decreased fetal movement or other worrisome symptoms, advised to go MAU

## 2012-08-29 NOTE — Patient Instructions (Addendum)
Try phenergan 1-2 tablets every 8 hours as needed You may continue Zofran as well  STOP benadryl  Follow-up with Dr. Elwyn Reach next week  We will check ultrasound to make sure your baby is growing okay

## 2012-08-30 LAB — STREP B DNA PROBE: GBSP: NEGATIVE

## 2012-08-30 NOTE — Progress Notes (Signed)
Negative GC/Chlamydia, GBS

## 2012-09-02 ENCOUNTER — Ambulatory Visit (HOSPITAL_COMMUNITY)
Admission: RE | Admit: 2012-09-02 | Discharge: 2012-09-02 | Disposition: A | Discharge status: Home / Self Care | Payer: Medicaid Other | Source: Ambulatory Visit | Attending: Family Medicine | Admitting: Family Medicine

## 2012-09-02 DIAGNOSIS — Z3689 Encounter for other specified antenatal screening: Secondary | ICD-10-CM | POA: Insufficient documentation

## 2012-09-02 DIAGNOSIS — O365990 Maternal care for other known or suspected poor fetal growth, unspecified trimester, not applicable or unspecified: Secondary | ICD-10-CM | POA: Insufficient documentation

## 2012-09-02 DIAGNOSIS — IMO0002 Reserved for concepts with insufficient information to code with codable children: Secondary | ICD-10-CM

## 2012-09-03 ENCOUNTER — Telehealth: Payer: Self-pay | Admitting: Family Medicine

## 2012-09-03 NOTE — Telephone Encounter (Signed)
Notified normal [place], no restricted growth.  Advised she call today and schedule routine prenatal follow-up with Dr. Elwyn Reach.  Her nausea/vomiting is improved on current medications.

## 2012-09-08 ENCOUNTER — Ambulatory Visit (INDEPENDENT_AMBULATORY_CARE_PROVIDER_SITE_OTHER): Payer: Medicaid Other | Admitting: Family Medicine

## 2012-09-08 VITALS — BP 110/75 | Wt 159.0 lb

## 2012-09-08 DIAGNOSIS — Z3480 Encounter for supervision of other normal pregnancy, unspecified trimester: Secondary | ICD-10-CM

## 2012-09-08 DIAGNOSIS — Z3493 Encounter for supervision of normal pregnancy, unspecified, third trimester: Secondary | ICD-10-CM

## 2012-09-08 NOTE — Progress Notes (Signed)
22 year old G2P1001 presents for routine OB follow up.  Still having some nausea/vomiting.  Reports good fetal movement and irregular contractions.   - Labor precautions - GBS negative, anticipate NSVD - fetal kick counts reviewed, advised to seek care if baby not moving well - f/u in one week with PCP unless she delivers

## 2012-09-08 NOTE — Patient Instructions (Signed)
It was nice to meet you- your due date is in two weeks.  If you think you are in labor, think your water has broken, have heavy bleeding, or do not think the baby is moving well go to [facility].   Please make an appointment to see Dr. Elwyn Reach in 1 week.

## 2012-09-10 ENCOUNTER — Encounter (HOSPITAL_COMMUNITY): Payer: Self-pay | Admitting: *Deleted

## 2012-09-10 ENCOUNTER — Inpatient Hospital Stay (HOSPITAL_COMMUNITY)
Admission: AD | Admit: 2012-09-10 | Discharge: 2012-09-10 | Disposition: A | Discharge status: Home / Self Care | Payer: Medicaid Other | Source: Ambulatory Visit | Attending: Obstetrics & Gynecology | Admitting: Obstetrics & Gynecology

## 2012-09-10 DIAGNOSIS — B9689 Other specified bacterial agents as the cause of diseases classified elsewhere: Secondary | ICD-10-CM | POA: Insufficient documentation

## 2012-09-10 DIAGNOSIS — M549 Dorsalgia, unspecified: Secondary | ICD-10-CM | POA: Insufficient documentation

## 2012-09-10 DIAGNOSIS — O26893 Other specified pregnancy related conditions, third trimester: Secondary | ICD-10-CM

## 2012-09-10 DIAGNOSIS — N760 Acute vaginitis: Secondary | ICD-10-CM | POA: Insufficient documentation

## 2012-09-10 DIAGNOSIS — N898 Other specified noninflammatory disorders of vagina: Secondary | ICD-10-CM

## 2012-09-10 DIAGNOSIS — O2390 Unspecified genitourinary tract infection in pregnancy, unspecified trimester: Secondary | ICD-10-CM | POA: Insufficient documentation

## 2012-09-10 DIAGNOSIS — A499 Bacterial infection, unspecified: Secondary | ICD-10-CM | POA: Insufficient documentation

## 2012-09-10 LAB — WET PREP, GENITAL
Trich, Wet Prep: NONE SEEN
Yeast Wet Prep HPF POC: NONE SEEN

## 2012-09-10 LAB — AMNISURE RUPTURE OF MEMBRANE (ROM) NOT AT ARMC: Amnisure ROM: NEGATIVE

## 2012-09-10 MED ORDER — METRONIDAZOLE 500 MG PO TABS: 500.0000 mg | ORAL_TABLET | Freq: Two times a day (BID) | ORAL | Status: DC

## 2012-09-10 NOTE — MAU Provider Note (Signed)
  History     CSN: [id number]  Arrival date and time: 09/10/12 2111   None     Chief Complaint  Patient presents with  . Rupture of Membranes  . Back Pain   HPI  Shannon Randolph is a 22 y.o. G2P1001 at [redacted]w[redacted]d who presents with concern about leaking fluid.  First noted fluid leaking last night. Today had to change her underwear five times. Started feeling contractions earlier today, occurring every 10 minutes. No bleeding. Feeling baby move now.  OB History   Grav Para Term Preterm Abortions TAB SAB Ect Mult Living   2 1 1       1       Past Medical History  Diagnosis Date  . No pertinent past medical history   . Medical history non-contributory     Past Surgical History  Procedure Laterality Date  . No past surgeries      History reviewed. No pertinent family history.  History  Substance Use Topics  . Smoking status: Former Smoker    Quit date: 01/11/2012  . Smokeless tobacco: Former Neurosurgeon    Quit date: 01/11/2012  . Alcohol Use: No    Allergies: No Known Allergies  Prescriptions prior to admission  Medication Sig Dispense Refill  . Prenatal Vit-Fe Fumarate-FA (PRENATAL MULTIVITAMIN) TABS Take 1 tablet by mouth daily.  30 tablet  3    ROS Physical Exam   Blood pressure 95/56, pulse 97, temperature 98 F (36.7 C), temperature source Oral, resp. rate 18, height 5\' 3"  (1.6 m), weight 158 lb (71.668 kg), last menstrual period 12/11/2011, SpO2 100.00%.  Physical Exam Gen: NAD HEENT: normocephalic, atraumatic Lungs: NWOB Neuro: grossly nonfocal, speech intact GU: normal appearing external genitalia. Sterile speculum exam shows thick white discharge in the vagina. No pooling. Dilation: 2 Effacement (%): 60 Cervical Position: Posterior Station: -2 Presentation: Vertex Exam by:: M.Topp,RN  FHR: baseline 130s, moderate variability, positive accels, no decels Toco: no ctx seen   MAU Course  Procedures  Results for orders placed during the  hospital encounter of 09/10/12 (from the past 24 hour(s))  WET PREP, GENITAL     Status: Abnormal   Collection Time    09/10/12 10:15 PM      Result Value Range   Yeast Wet Prep HPF POC NONE SEEN  NONE SEEN   Trich, Wet Prep NONE SEEN  NONE SEEN   Clue Cells Wet Prep HPF POC MODERATE (*) NONE SEEN   WBC, Wet Prep HPF POC MODERATE (*) NONE SEEN  AMNISURE RUPTURE OF MEMBRANE (ROM)     Status: None   Collection Time    09/10/12 10:38 PM      Result Value Range   Amnisure ROM NEGATIVE      Assessment and Plan  Shannon Randolph is a 22 y.o. G2P1001 at [redacted]w[redacted]d who presents with concern about leaking fluid. Pooling, fern, and amnisure negative. FHR tracing reassuring. No ctx seen on monitor.  Wet prep shows BV - will tx with flagyl 500mg  PO BID x 7 days  Instructed to f/u with PCP, also instructed on reasons to return (fluid leaking, bleeding, contractions, or decreased fetal movement).  Levert Feinstein 09/10/2012, 10:33 PM   .I have seen the patient with the resident/student and agree with the above.  Tawnya Crook

## 2012-09-10 NOTE — MAU Note (Signed)
Pt reports leaking fluid since 8am. Lower back pain all day.

## 2012-09-17 ENCOUNTER — Inpatient Hospital Stay (HOSPITAL_COMMUNITY)
Admission: AD | Admit: 2012-09-17 | Discharge: 2012-09-17 | Disposition: A | Discharge status: Home / Self Care | Payer: Medicaid Other | Source: Ambulatory Visit | Attending: Obstetrics & Gynecology | Admitting: Obstetrics & Gynecology

## 2012-09-17 ENCOUNTER — Telehealth: Payer: Self-pay | Admitting: *Deleted

## 2012-09-17 ENCOUNTER — Encounter (HOSPITAL_COMMUNITY): Payer: Self-pay | Admitting: *Deleted

## 2012-09-17 DIAGNOSIS — N760 Acute vaginitis: Secondary | ICD-10-CM | POA: Insufficient documentation

## 2012-09-17 DIAGNOSIS — O4690 Antepartum hemorrhage, unspecified, unspecified trimester: Secondary | ICD-10-CM | POA: Insufficient documentation

## 2012-09-17 DIAGNOSIS — A499 Bacterial infection, unspecified: Secondary | ICD-10-CM | POA: Insufficient documentation

## 2012-09-17 DIAGNOSIS — O4693 Antepartum hemorrhage, unspecified, third trimester: Secondary | ICD-10-CM

## 2012-09-17 DIAGNOSIS — R109 Unspecified abdominal pain: Secondary | ICD-10-CM | POA: Insufficient documentation

## 2012-09-17 DIAGNOSIS — B9689 Other specified bacterial agents as the cause of diseases classified elsewhere: Secondary | ICD-10-CM | POA: Insufficient documentation

## 2012-09-17 DIAGNOSIS — O2390 Unspecified genitourinary tract infection in pregnancy, unspecified trimester: Secondary | ICD-10-CM | POA: Insufficient documentation

## 2012-09-17 HISTORY — DX: Unspecified infectious disease: B99.9

## 2012-09-17 LAB — WET PREP, GENITAL: Trich, Wet Prep: NONE SEEN

## 2012-09-17 MED ORDER — METRONIDAZOLE 500 MG PO TABS: 500.0000 mg | ORAL_TABLET | Freq: Two times a day (BID) | ORAL | Status: DC

## 2012-09-17 NOTE — MAU Provider Note (Signed)
History     CSN: [id number]  Arrival date and time: 09/17/12 1443   None     Chief Complaint  Patient presents with  . Vaginal Bleeding  . Abdominal Pain   HPI 22 y.o. G2P1001 at [redacted]w[redacted]d with bleeding last night during intercourse. Continued to bleed this morning, like a period with some lower abdominal pain that comes and goes. Also having pains in lower back shooting down both legs like electric shock. This has been going on for a long time with no change, happens when walking. No weakness in extremities.  Baby moving well. No loss of fluid. Had BV on recent wet prep (4/16), negative GC/chlamydia 08/28/12. Has not been treated for BV.   Receives [facility] at [facility] Dr. Elwyn Reach. Last appt 4/3.  Seen in MAU 4/16 with possible ROM, amnisure negative. No complications of this pregnancy or previous (vaginal delivery).  OB History   Grav Para Term Preterm Abortions TAB SAB Ect Mult Living   2 1 1       1       Past Medical History  Diagnosis Date  . No pertinent past medical history   . Infection     UTI, gonorrhea    Past Surgical History  Procedure Laterality Date  . No past surgeries      Family History  Problem Relation Age of Onset  . Hearing loss Neg Hx     History  Substance Use Topics  . Smoking status: Former Smoker    Quit date: 01/11/2012  . Smokeless tobacco: Former Neurosurgeon    Quit date: 01/11/2012  . Alcohol Use: No    Allergies: No Known Allergies  Prescriptions prior to admission  Medication Sig Dispense Refill  . Prenatal Vit-Fe Fumarate-FA (PRENATAL MULTIVITAMIN) TABS Take 1 tablet by mouth daily.  30 tablet  3    ROS No fever/chills/nausea/vomiting/dysuria. Other pertinent pos and neg stated in HPI.  Physical Exam   Blood pressure 120/68, pulse 89, temperature 98.5 F (36.9 C), temperature source Oral, resp. rate 18, height 5\' 2"  (1.575 m), weight 72.122 kg (159 lb), last menstrual period 12/11/2011.  Physical Exam  Constitutional: She is oriented to  person, place, and time. She appears well-developed and well-nourished. No distress.  HENT:  Head: Normocephalic and atraumatic.  Eyes: Conjunctivae and EOM are normal.  Neck: Normal range of motion. Neck supple.  Cardiovascular: Normal rate.   Respiratory: Effort normal. No respiratory distress.  GI: Soft. There is no tenderness. There is no rebound and no guarding.  Genitourinary:  Normal external genitalia. Normal vagina. Frothy yellow/brown discharge, no frank blood. Cervix cl/thick/posterior.   Musculoskeletal: Normal range of motion. She exhibits no edema and no tenderness.  Neurological: She is alert and oriented to person, place, and time.  Skin: Skin is warm and dry.   Results for orders placed during the hospital encounter of 09/17/12 (from the past 24 hour(s))  WET PREP, GENITAL     Status: Abnormal   Collection Time    09/17/12  3:54 PM      Result Value Range   Yeast Wet Prep HPF POC NONE SEEN  NONE SEEN   Trich, Wet Prep NONE SEEN  NONE SEEN   Clue Cells Wet Prep HPF POC FEW (*) NONE SEEN   WBC, Wet Prep HPF POC FEW (*) NONE SEEN     FHTs:  140, mod var, accels present, no decels - 30 min strip.  Cat I TOCO:  Occasional ctx, irritability  MAU Course  Procedures  Assessment and Plan  22 y.o. G2P1001 at [redacted]w[redacted]d with Vaginal bleeding 3rd trimester - Bleeding during intercourse - Untreated BV - encouraged pt to take metronidazole - Cervix closed, ctx infrequent - not in labor - FHTs - reactive NST - Stable for discharge home - labor precautions reviewed  Napoleon Form 09/17/2012, 3:44 PM

## 2012-09-17 NOTE — Telephone Encounter (Signed)
Patient calling because she is pregnant and her due date is Monday (09/22/12).  Patient had intercourse last night and started bleeding (red).  Still having some bleeding today.  Denies any cramping, contractions, or pain.  Informed patient to go to [facility] to be evaluated.  Patient agreeable.  Gaylene Brooks, RN

## 2012-09-17 NOTE — MAU Note (Signed)
Was dx with BV on 04/16- did not pick up prescription.

## 2012-09-17 NOTE — MAU Note (Signed)
Pt moving about in bed, tracing maternal- but hurts when she lays back.  Repositioned to left side.

## 2012-09-17 NOTE — MAU Note (Addendum)
Started bleeding last night during intercourse.  When woke up this morning was dripping down legs, saw some on tissue when wiped.  Has continued to see bleeding when she used the restroom today. Pain in legs, back and butt, ongoing problem..  Denies hx of previa or low lying placenta.

## 2012-09-17 NOTE — MAU Note (Signed)
Patient states that at 0700 this am when she woke up she had blood running down her legs. Patient is not wearing a pad and is not having any bleeding at this time. States she is having lower abdominal pain but she does not feel like it is contractions. Reports good fetal movement.

## 2012-09-19 ENCOUNTER — Inpatient Hospital Stay (HOSPITAL_COMMUNITY)
Admission: AD | Admit: 2012-09-19 | Discharge: 2012-09-19 | Disposition: A | Discharge status: Home / Self Care | Payer: Medicaid Other | Source: Ambulatory Visit | Attending: Obstetrics & Gynecology | Admitting: Obstetrics & Gynecology

## 2012-09-19 ENCOUNTER — Encounter (HOSPITAL_COMMUNITY): Payer: Self-pay | Admitting: *Deleted

## 2012-09-19 ENCOUNTER — Encounter: Payer: Medicaid Other | Admitting: Emergency Medicine

## 2012-09-19 ENCOUNTER — Ambulatory Visit (INDEPENDENT_AMBULATORY_CARE_PROVIDER_SITE_OTHER): Payer: Medicaid Other | Admitting: Emergency Medicine

## 2012-09-19 VITALS — BP 114/75 | Wt 158.9 lb

## 2012-09-19 DIAGNOSIS — O471 False labor at or after 37 completed weeks of gestation: Secondary | ICD-10-CM

## 2012-09-19 DIAGNOSIS — Z3480 Encounter for supervision of other normal pregnancy, unspecified trimester: Secondary | ICD-10-CM

## 2012-09-19 DIAGNOSIS — O99891 Other specified diseases and conditions complicating pregnancy: Secondary | ICD-10-CM | POA: Insufficient documentation

## 2012-09-19 DIAGNOSIS — Z3483 Encounter for supervision of other normal pregnancy, third trimester: Secondary | ICD-10-CM

## 2012-09-19 DIAGNOSIS — O479 False labor, unspecified: Secondary | ICD-10-CM | POA: Insufficient documentation

## 2012-09-19 HISTORY — DX: Other specified bacterial agents as the cause of diseases classified elsewhere: B96.89

## 2012-09-19 HISTORY — DX: Acute vaginitis: N76.0

## 2012-09-19 LAB — AMNISURE RUPTURE OF MEMBRANE (ROM) NOT AT ARMC: Amnisure ROM: NEGATIVE

## 2012-09-19 NOTE — Progress Notes (Signed)
S: 22 yo G2P1001 at [redacted]w[redacted]d.  Back pain is stable.  Seen in MAU 2 days ago for bleeding after sex.  This has resolved.  Also with BV, started flagyl yesterday.  +FM, no vaginal bleeding or discharge.  States had contractions last night, but more intermittent this morning.  No loss of fluid.    O: see flowsheet  A/P: 22 yo G2P1001 at [redacted]w[redacted]d - GBS negative, B+ - h/o chlamydia during pregnancy, TOC negative - Mirena for birth control - [facility] for pediatrician - Reviewed labor precautions.  Recommended that patient get lunch, walk around, and then go to MAU for recheck prior to returning home to [place].   - Follow up in 1 week if not delivered

## 2012-09-19 NOTE — MAU Provider Note (Signed)
Attestation of Attending Supervision of Advanced Practitioner (CNM/NP)/Fellow: Evaluation and management procedures were performed by the Advanced Practitioner under my supervision and collaboration.  I have reviewed the Advanced Practitioner's note and chart, and I agree with the management and plan.  HARRAWAY-SMITH, Daylan Boggess 4:00 PM

## 2012-09-19 NOTE — MAU Provider Note (Signed)
  History     CSN: [id number]  Arrival date and time: 09/19/12 1316   First Provider Initiated Contact with Patient 09/19/12 1422      Chief Complaint  Patient presents with  . Rupture of Membranes   HPI 22 y.o. G2P1001 at [redacted]w[redacted]d with possible ROM. Saw her OB today (family medicine) and told she was 3-4 cm. Mild ctx, was on toilet and felt gush of fluid. Is being treated for BV. No bleeding. Baby moving well. No nausea/vomiting, headache, vision changes.  OB History   Grav Para Term Preterm Abortions TAB SAB Ect Mult Living   2 1 1       1     No complications of this pregnancy or prior.  Past Medical History  Diagnosis Date  . No pertinent past medical history   . Infection     UTI, gonorrhea  . Bacterial vaginosis     Past Surgical History  Procedure Laterality Date  . No past surgeries      Family History  Problem Relation Age of Onset  . Hearing loss Neg Hx     History  Substance Use Topics  . Smoking status: Former Smoker    Quit date: 01/11/2012  . Smokeless tobacco: Former Neurosurgeon    Quit date: 01/11/2012  . Alcohol Use: No    Allergies: No Known Allergies  Prescriptions prior to admission  Medication Sig Dispense Refill  . metroNIDAZOLE (FLAGYL) 500 MG tablet Take 1 tablet (500 mg total) by mouth 2 (two) times daily.  14 tablet  0  . Prenatal Vit-Fe Fumarate-FA (PRENATAL MULTIVITAMIN) TABS Take 1 tablet by mouth daily.  30 tablet  3    ROS  Pertinent pos/neg listed in HPI  Physical Exam   Blood pressure 100/77, pulse 96, temperature 98.9 F (37.2 C), temperature source Oral, resp. rate 18, height 5\' 2"  (1.575 m), weight 71.668 kg (158 lb), last menstrual period 12/11/2011, SpO2 100.00%.  Physical Exam  Constitutional: She is oriented to person, place, and time. She appears well-developed and well-nourished. No distress.  HENT:  Head: Normocephalic and atraumatic.  Eyes: Conjunctivae and EOM are normal.  Neck: Normal range of motion. Neck supple.   Cardiovascular: Normal rate, regular rhythm and normal heart sounds.   Respiratory: Effort normal and breath sounds normal. No respiratory distress.  GI: Soft. Bowel sounds are normal. There is no tenderness. There is no rebound and no guarding.  Genitourinary:  Normal external genitalia. Normal vagina. Mod yellow/white discharge. No blood. Cervix 4/50/-2, posterior, very soft and stretchy.  Musculoskeletal: Normal range of motion. She exhibits no edema and no tenderness.  Neurological: She is alert and oriented to person, place, and time.  Skin: Skin is warm and dry.  Psychiatric: She has a normal mood and affect.    FHTs:  140, mod var, accels present, no decels TOCO:  Ctx > q 10 min  Results for orders placed during the hospital encounter of 09/19/12 (from the past 24 hour(s))  AMNISURE RUPTURE OF MEMBRANE (ROM)     Status: None   Collection Time    09/19/12  2:22 PM      Result Value Range   Amnisure ROM NEGATIVE       MAU Course  Procedures    Assessment and Plan  22 y.o. G2P1001 at [redacted]w[redacted]d with false labor - Not ruptured. Not in labor - Labor precautions reviewed - Stable for d/c home. - FHTs reactive   Napoleon Form 09/19/2012, 3:36 PM

## 2012-09-19 NOTE — MAU Note (Signed)
Pt was seen by Dr. Elwyn Reach an hour ago at [facility] practice for regular OB appt.  Pt was told she was 4 cm to go eat lunch and walk to begin labor.  Pt said about 30 min ago she had wetness, clear in nature in her panties and knows she didn't pee on herself.  Denies vaginal bleeding/  Good fetal movement.

## 2012-09-19 NOTE — Patient Instructions (Addendum)
It was nice to see you! Your cervix is starting to open up!  You are 3-4cm and 60% effaced. If your contractions continue or get stronger, or you break your water, please go to [facility].

## 2012-09-20 ENCOUNTER — Encounter (HOSPITAL_COMMUNITY): Payer: Self-pay | Admitting: Family

## 2012-09-20 ENCOUNTER — Encounter (HOSPITAL_COMMUNITY): Payer: Self-pay | Admitting: Anesthesiology

## 2012-09-20 ENCOUNTER — Inpatient Hospital Stay (HOSPITAL_COMMUNITY)
Admission: AD | Admit: 2012-09-20 | Discharge: 2012-09-22 | DRG: 775 | Disposition: A | Discharge status: Home / Self Care | Payer: Medicaid Other | Source: Ambulatory Visit | Attending: Obstetrics & Gynecology | Admitting: Obstetrics & Gynecology

## 2012-09-20 ENCOUNTER — Inpatient Hospital Stay (HOSPITAL_COMMUNITY): Payer: Medicaid Other | Admitting: Anesthesiology

## 2012-09-20 DIAGNOSIS — IMO0001 Reserved for inherently not codable concepts without codable children: Secondary | ICD-10-CM

## 2012-09-20 DIAGNOSIS — B9689 Other specified bacterial agents as the cause of diseases classified elsewhere: Secondary | ICD-10-CM

## 2012-09-20 DIAGNOSIS — O2390 Unspecified genitourinary tract infection in pregnancy, unspecified trimester: Secondary | ICD-10-CM | POA: Diagnosis present

## 2012-09-20 DIAGNOSIS — A499 Bacterial infection, unspecified: Secondary | ICD-10-CM | POA: Diagnosis present

## 2012-09-20 DIAGNOSIS — N760 Acute vaginitis: Secondary | ICD-10-CM | POA: Diagnosis present

## 2012-09-20 LAB — RPR: RPR Ser Ql: NONREACTIVE

## 2012-09-20 LAB — POCT FERN TEST: POCT Fern Test: POSITIVE

## 2012-09-20 LAB — CBC
Hemoglobin: 11.7 g/dL — ABNORMAL LOW (ref 12.0–15.0)
MCHC: 34 g/dL (ref 30.0–36.0)
WBC: 17.3 10*3/uL — ABNORMAL HIGH (ref 4.0–10.5)

## 2012-09-20 MED ORDER — FENTANYL CITRATE 0.05 MG/ML IJ SOLN
100.0000 ug | INTRAMUSCULAR | Status: DC | PRN
  Administered 2012-09-20: 100 ug via INTRAVENOUS
  Filled 2012-09-20: qty 2

## 2012-09-20 MED ORDER — PHENYLEPHRINE 40 MCG/ML (10ML) SYRINGE FOR IV PUSH (FOR BLOOD PRESSURE SUPPORT)
80.0000 ug | PREFILLED_SYRINGE | INTRAVENOUS | Status: DC | PRN
  Filled 2012-09-20: qty 2

## 2012-09-20 MED ORDER — LACTATED RINGERS IV SOLN
500.0000 mL | INTRAVENOUS | Status: DC | PRN
  Administered 2012-09-20: 500 mL via INTRAVENOUS

## 2012-09-20 MED ORDER — ONDANSETRON HCL 4 MG/2ML IJ SOLN: 4.0000 mg | Freq: Four times a day (QID) | INTRAMUSCULAR | Status: DC | PRN

## 2012-09-20 MED ORDER — OXYTOCIN 40 UNITS IN LACTATED RINGERS INFUSION - SIMPLE MED
62.5000 mL/h | INTRAVENOUS | Status: DC
  Filled 2012-09-20: qty 1000

## 2012-09-20 MED ORDER — PRENATAL MULTIVITAMIN CH
1.0000 | ORAL_TABLET | Freq: Every day | ORAL | Status: DC
  Administered 2012-09-21: 1 via ORAL
  Filled 2012-09-20: qty 1

## 2012-09-20 MED ORDER — LACTATED RINGERS IV SOLN
500.0000 mL | Freq: Once | INTRAVENOUS | Status: AC
  Administered 2012-09-20: 500 mL via INTRAVENOUS

## 2012-09-20 MED ORDER — SENNOSIDES-DOCUSATE SODIUM 8.6-50 MG PO TABS
2.0000 | ORAL_TABLET | Freq: Every day | ORAL | Status: DC
  Administered 2012-09-20 – 2012-09-21 (×2): 2 via ORAL

## 2012-09-20 MED ORDER — LACTATED RINGERS IV SOLN
INTRAVENOUS | Status: DC
  Administered 2012-09-20: 19:00:00 via INTRAVENOUS

## 2012-09-20 MED ORDER — OXYCODONE-ACETAMINOPHEN 5-325 MG PO TABS: 1.0000 | ORAL_TABLET | ORAL | Status: DC | PRN

## 2012-09-20 MED ORDER — BENZOCAINE-MENTHOL 20-0.5 % EX AERO: 1.0000 "application " | INHALATION_SPRAY | CUTANEOUS | Status: DC | PRN

## 2012-09-20 MED ORDER — FLEET ENEMA 7-19 GM/118ML RE ENEM: 1.0000 | ENEMA | RECTAL | Status: DC | PRN

## 2012-09-20 MED ORDER — LIDOCAINE HCL (PF) 1 % IJ SOLN
INTRAMUSCULAR | Status: DC | PRN
  Administered 2012-09-20 (×2): 5 mL

## 2012-09-20 MED ORDER — DIPHENHYDRAMINE HCL 25 MG PO CAPS: 25.0000 mg | ORAL_CAPSULE | Freq: Four times a day (QID) | ORAL | Status: DC | PRN

## 2012-09-20 MED ORDER — CITRIC ACID-SODIUM CITRATE 334-500 MG/5ML PO SOLN: 30.0000 mL | ORAL | Status: DC | PRN

## 2012-09-20 MED ORDER — DIBUCAINE 1 % RE OINT: 1.0000 "application " | TOPICAL_OINTMENT | RECTAL | Status: DC | PRN

## 2012-09-20 MED ORDER — ACETAMINOPHEN 325 MG PO TABS: 650.0000 mg | ORAL_TABLET | ORAL | Status: DC | PRN

## 2012-09-20 MED ORDER — OXYTOCIN BOLUS FROM INFUSION
500.0000 mL | INTRAVENOUS | Status: DC
  Administered 2012-09-20: 500 mL via INTRAVENOUS

## 2012-09-20 MED ORDER — OXYCODONE-ACETAMINOPHEN 5-325 MG PO TABS
1.0000 | ORAL_TABLET | ORAL | Status: DC | PRN
  Administered 2012-09-21 (×2): 1 via ORAL
  Filled 2012-09-20 (×2): qty 1

## 2012-09-20 MED ORDER — ZOLPIDEM TARTRATE 5 MG PO TABS: 5.0000 mg | ORAL_TABLET | Freq: Every evening | ORAL | Status: DC | PRN

## 2012-09-20 MED ORDER — ONDANSETRON HCL 4 MG PO TABS
4.0000 mg | ORAL_TABLET | ORAL | Status: DC | PRN
  Administered 2012-09-20: 4 mg via ORAL
  Filled 2012-09-20: qty 1

## 2012-09-20 MED ORDER — EPHEDRINE 5 MG/ML INJ
10.0000 mg | INTRAVENOUS | Status: DC | PRN
  Filled 2012-09-20: qty 4
  Filled 2012-09-20: qty 2

## 2012-09-20 MED ORDER — IBUPROFEN 600 MG PO TABS: 600.0000 mg | ORAL_TABLET | Freq: Four times a day (QID) | ORAL | Status: DC | PRN

## 2012-09-20 MED ORDER — IBUPROFEN 600 MG PO TABS
600.0000 mg | ORAL_TABLET | Freq: Four times a day (QID) | ORAL | Status: DC
  Administered 2012-09-21 – 2012-09-22 (×6): 600 mg via ORAL
  Filled 2012-09-20 (×6): qty 1

## 2012-09-20 MED ORDER — WITCH HAZEL-GLYCERIN EX PADS: 1.0000 "application " | MEDICATED_PAD | CUTANEOUS | Status: DC | PRN

## 2012-09-20 MED ORDER — SIMETHICONE 80 MG PO CHEW: 80.0000 mg | CHEWABLE_TABLET | ORAL | Status: DC | PRN

## 2012-09-20 MED ORDER — FENTANYL 2.5 MCG/ML BUPIVACAINE 1/10 % EPIDURAL INFUSION (WH - ANES)
14.0000 mL/h | INTRAMUSCULAR | Status: DC | PRN
  Administered 2012-09-20: 14 mL/h via EPIDURAL
  Filled 2012-09-20: qty 125

## 2012-09-20 MED ORDER — PHENYLEPHRINE 40 MCG/ML (10ML) SYRINGE FOR IV PUSH (FOR BLOOD PRESSURE SUPPORT)
80.0000 ug | PREFILLED_SYRINGE | INTRAVENOUS | Status: DC | PRN
  Filled 2012-09-20: qty 2
  Filled 2012-09-20: qty 5

## 2012-09-20 MED ORDER — LANOLIN HYDROUS EX OINT: TOPICAL_OINTMENT | CUTANEOUS | Status: DC | PRN

## 2012-09-20 MED ORDER — EPHEDRINE 5 MG/ML INJ
10.0000 mg | INTRAVENOUS | Status: DC | PRN
  Filled 2012-09-20: qty 2

## 2012-09-20 MED ORDER — DIPHENHYDRAMINE HCL 50 MG/ML IJ SOLN: 12.5000 mg | INTRAMUSCULAR | Status: DC | PRN

## 2012-09-20 MED ORDER — LIDOCAINE HCL (PF) 1 % IJ SOLN
30.0000 mL | INTRAMUSCULAR | Status: DC | PRN
  Filled 2012-09-20 (×2): qty 30

## 2012-09-20 MED ORDER — ONDANSETRON HCL 4 MG/2ML IJ SOLN: 4.0000 mg | INTRAMUSCULAR | Status: DC | PRN

## 2012-09-20 NOTE — Progress Notes (Signed)
Shannon Randolph is a 22 y.o. G2P1001 at [redacted]w[redacted]d admitted for active labor, rupture of membranes  Subjective: Complaining of pain in the vagina.  RN checked her and baby is now much lower, +1, +2 station.  Contractions have spaced out.  Objective: BP 107/70  Pulse 89  Temp(Src) 97.5 F (36.4 C) (Oral)  Resp 14  Ht 5\' 2"  (1.575 m)  Wt 158 lb (71.668 kg)  BMI 28.89 kg/m2  SpO2 100%  LMP 12/11/2011      FHT:  FHR: 130 bpm, variability: moderate,  accelerations:  Present,  decelerations:  Present variable UC:   irregular, every 3-8 minutes SVE:   Dilation: 5.5 Effacement (%): 100 Station: +1;+2 Exam by:: Shannon Heath, RN  Labs: Lab Results  Component Value Date   WBC 17.3* 09/20/2012   HGB 11.7* 09/20/2012   HCT 34.4* 09/20/2012   MCV 83.7 09/20/2012   PLT 169 09/20/2012    Assessment / Plan: Spontaneous labor, progressing normally  Labor: Progressing normally - will monitor a little longer now that the head has descended some; if contractions do not pick up will likely need to start pitocin. Fetal Wellbeing:  Category II Pain Control:  Epidural I/D:  n/a Anticipated MOD:  NSVD  Shannon Randolph 09/20/2012, 7:20 PM

## 2012-09-20 NOTE — Anesthesia Preprocedure Evaluation (Signed)

## 2012-09-20 NOTE — H&P (Signed)
Shannon Randolph is a 22 y.o. G65P1001 female at [redacted]w[redacted]d by 9wk u/s presenting w/ report of regular uc's and srom clear fluid at 1300.  Reports good fm. Denies vb.  MAU visit yesterday for labor check r/o SROM, was d/c'd as amnisure neg and minimal cervical change.  Currently being treated for BV.  Initiated pnc @ [facility] w/ Dr. Elwyn Reach at 11.4wks. Genetic screening normal per pt- unable to locate results in chart, anatomy u/s wnl, 1hr gtt 114, gbs neg. H/O chlamydia earlier pregnancy, treated and had negative poc.  SVD in 2011, 8lb baby.    Maternal Medical History:  Reason for admission: Rupture of membranes and contractions.   Contractions: Onset was 3-5 hours ago.   Frequency: regular.   Perceived severity is moderate.    Fetal activity: Perceived fetal activity is normal.   Last perceived fetal movement was within the past hour.    Prenatal complications: Pos chlamydia early in pregnancy and treated,with neg poc  Prenatal Complications - Diabetes: none.    OB History   Grav Para Term Preterm Abortions TAB SAB Ect Mult Living   2 1 1       1      Past Medical History  Diagnosis Date  . No pertinent past medical history   . Infection     UTI, gonorrhea  . Bacterial vaginosis    Past Surgical History  Procedure Laterality Date  . No past surgeries     Family History: family history is negative for Hearing loss. Social History:  reports that she quit smoking about 8 months ago. She quit smokeless tobacco use about 8 months ago. She reports that she does not drink alcohol or use illicit drugs.  Review of Systems  Constitutional: Negative.   HENT: Negative.   Eyes: Negative.   Respiratory: Negative.   Cardiovascular: Negative.   Gastrointestinal: Positive for abdominal pain (uc's).  Genitourinary: Negative.   Musculoskeletal: Negative.   Skin: Negative.   Neurological: Negative.   Endo/Heme/Allergies: Negative.   Psychiatric/Behavioral: Negative.     Dilation:  4.5 Effacement (%): 100 Station: -2 Exam by:: Woodroe Chen, RN  Blood pressure 137/77, pulse 116, resp. rate 18, height 5\' 2"  (1.575 m), weight 71.668 kg (158 lb), last menstrual period 12/11/2011. Maternal Exam:  Uterine Assessment: Contraction strength is mild.  Contraction frequency is irregular.   Abdomen: Patient reports no abdominal tenderness. Fetal presentation: vertex  Introitus: Ferning test: positive.  Amniotic fluid character: clear.  Cervix: Cervix evaluated by digital exam.     Physical Exam  Constitutional: She is oriented to person, place, and time. She appears well-developed and well-nourished.  HENT:  Head: Normocephalic.  Neck: Normal range of motion.  Cardiovascular: Normal rate and regular rhythm.   Respiratory: Effort normal and breath sounds normal.  GI: Soft.  gravid  Genitourinary:  SVE by MAU RN  Musculoskeletal: Normal range of motion.  Neurological: She is alert and oriented to person, place, and time. She has normal reflexes.  Skin: Skin is warm and dry.  Psychiatric: She has a normal mood and affect. Her behavior is normal. Judgment and thought content normal.    FHR: 150, mod variability, 15x15accels, no decels UCs: q 6 w/ ui  Prenatal labs: ABO, Rh: B/POS/-- (10/04 1610) Antibody: NEG (10/04 0958) Rubella: 29.9 (10/04 0958) RPR: NON REAC (02/26 1528)  HBsAg: NEGATIVE (10/04 0958)  HIV: NON REACTIVE (02/26 1528)  GBS: NEGATIVE (04/03 0929)   Assessment/Plan: A:  [redacted]w[redacted]d SIUP  SROM w/ early  labor  Cat I FHR  GBS neg  P:  Admit to BS  IV pain meds/epidural prn  Expectant management  Anticipate NSVD  Dr. Elwyn Reach notified and will come     Marge Duncans 09/20/2012, 4:20 PM

## 2012-09-20 NOTE — Progress Notes (Signed)
Shannon Randolph is a 22 y.o. G2P1001 at [redacted]w[redacted]d by LMP admitted for active labor, rupture of membranes  Subjective:   Objective: BP 115/65  Pulse 102  Temp(Src) 99 F (37.2 C) (Oral)  Resp 12  Ht 5\' 2"  (1.575 m)  Wt 158 lb (71.668 kg)  BMI 28.89 kg/m2  SpO2 100%  LMP 12/11/2011      FHT:  FHR: 135 bpm, variability: moderate,  accelerations:  Abscent,  decelerations:  Absent UC:   regular, every 4 minutes SVE:   Dilation: 5 Effacement (%): 100 Station: -2 Exam by:: Dr. Elwyn Reach  Labs: Lab Results  Component Value Date   WBC 17.3* 09/20/2012   HGB 11.7* 09/20/2012   HCT 34.4* 09/20/2012   MCV 83.7 09/20/2012   PLT 169 09/20/2012    Assessment / Plan: Spontaneous labor, progressing normally  Labor: Progressing normally Preeclampsia:  n/a Fetal Wellbeing:  Category II Pain Control:  Epidural I/D:  gbs negative Anticipated MOD:  NSVD  [name], Shannon Randolph 09/20/2012, 5:35 PM

## 2012-09-20 NOTE — MAU Note (Signed)
pt reports having ctx since yesterday. Was 4 cm. Reports leaking some clear fluid and having some spotting.

## 2012-09-20 NOTE — Anesthesia Procedure Notes (Signed)
Epidural Patient location during procedure: OB Start time: 09/20/2012 5:12 PM  Staffing Anesthesiologist: Angus Seller., Harrell Gave. Performed by: anesthesiologist   Preanesthetic Checklist Completed: patient identified, site marked, surgical consent, pre-op evaluation, timeout performed, IV checked, risks and benefits discussed and monitors and equipment checked  Epidural Patient position: sitting Prep: site prepped and draped and DuraPrep Patient monitoring: continuous pulse ox and blood pressure Approach: midline Injection technique: LOR air and LOR saline  Needle:  Needle type: Tuohy  Needle gauge: 17 G Needle length: 9 cm and 9 Needle insertion depth: 5 cm cm Catheter type: closed end flexible Catheter size: 19 Gauge Catheter at skin depth: 10 cm Test dose: negative  Assessment Events: blood not aspirated, injection not painful, no injection resistance, negative IV test and no paresthesia  Additional Notes Patient identified.  Risk benefits discussed including failed block, incomplete pain control, headache, nerve damage, paralysis, blood pressure changes, nausea, vomiting, reactions to medication both toxic or allergic, and postpartum back pain.  Patient expressed understanding and wished to proceed.  All questions were answered.  Sterile technique used throughout procedure and epidural site dressed with sterile barrier dressing. No paresthesia or other complications noted.The patient did not experience any signs of intravascular injection such as tinnitus or metallic taste in mouth nor signs of intrathecal spread such as rapid motor block. Please see nursing notes for vital signs.

## 2012-09-20 NOTE — MAU Note (Signed)
Patient presents to MAU with c/o contractions every 5 minutes x 2 hours; possible ROM. Denies vaginal bleeding. Reports + fetal movement.

## 2012-09-21 LAB — CBC
Hemoglobin: 10.5 g/dL — ABNORMAL LOW (ref 12.0–15.0)
MCH: 28.6 pg (ref 26.0–34.0)
MCHC: 34.3 g/dL (ref 30.0–36.0)
Platelets: 143 10*3/uL — ABNORMAL LOW (ref 150–400)
RBC: 3.67 MIL/uL — ABNORMAL LOW (ref 3.87–5.11)
RDW: 13.3 % (ref 11.5–15.5)
WBC: 18.7 10*3/uL — ABNORMAL HIGH (ref 4.0–10.5)

## 2012-09-21 NOTE — Anesthesia Postprocedure Evaluation (Signed)
  Anesthesia Post-op Note  Patient: Shannon Randolph  Procedure(s) Performed: * No procedures listed *  Patient Location: PACU and Mother/Baby  Anesthesia Type:Epidural  Level of Consciousness: awake, alert  and oriented  Airway and Oxygen Therapy: Patient Spontanous Breathing  Post-op Pain: mild  Post-op Assessment: Patient's Cardiovascular Status Stable, Respiratory Function Stable, No signs of Nausea or vomiting, Adequate PO intake and Pain level controlled  Post-op Vital Signs: stable  Complications: No apparent anesthesia complications

## 2012-09-21 NOTE — Progress Notes (Signed)
Post Partum Day 1 Subjective: no complaints, up ad lib, voiding, tolerating PO and + flatus, moderate lochia, absent BM, present flatus, plans to breastfeed, plans to bottle feed, IUD  Objective: Blood pressure 107/56, pulse 66, temperature 97.7 F (36.5 C), temperature source Oral, resp. rate 16, height 5\' 2"  (1.575 m), weight 158 lb (71.668 kg), last menstrual period 12/11/2011, SpO2 100.00%, unknown if currently breastfeeding.  Physical Exam:  General: alert, cooperative, appears stated age and no distress Lochia: appropriate Chest: CTAB Heart: RRR no m/r/g Abdomen: +BS, soft, nontender,  Uterine Fundus: firm DVT Evaluation: No evidence of DVT seen on physical exam. Negative Homan's sign. No cords or calf tenderness. No significant calf/ankle edema. Extremities:    Recent Labs  09/20/12 1555 09/21/12 0630  HGB 11.7* 10.5*  HCT 34.4* 30.6*    Assessment/Plan: Plan for discharge tomorrow, Breastfeeding, Lactation consult and Contraception Mirena at [facility] f/u   LOS: 1 day   [name], ERIN 09/21/2012, 10:31 AM   Evaluation and management procedures were performed by Resident physician under my supervision/collaboration. Chart reviewed, patient examined by me and I agree with management and plan.

## 2012-09-22 LAB — TYPE AND SCREEN
Donor AG Type: NEGATIVE
Unit division: 0

## 2012-09-22 MED ORDER — IBUPROFEN 600 MG PO TABS: 600.0000 mg | ORAL_TABLET | Freq: Four times a day (QID) | ORAL | Status: DC

## 2012-09-22 MED ORDER — FERROUS SULFATE 325 (65 FE) MG PO TABS: 325.0000 mg | ORAL_TABLET | Freq: Every day | ORAL | Status: DC

## 2012-09-22 NOTE — Progress Notes (Signed)
Ur chart review completed.  

## 2012-09-22 NOTE — Discharge Summary (Signed)
Obstetric Discharge Summary Reason for Admission: onset of labor and rupture of membranes Prenatal Procedures: ultrasound Intrapartum Procedures: spontaneous vaginal delivery Postpartum Procedures: none Complications-Operative and Postpartum: none Hemoglobin  Date Value Range Status  09/21/2012 10.5* 12.0 - 15.0 g/dL Final     HCT  Date Value Range Status  09/21/2012 30.6* 36.0 - 46.0 % Final    Physical Exam:  General: alert, cooperative, appears stated age and no distress Lochia: appropriate Uterine Fundus: firm Incision: n/a DVT Evaluation: No evidence of DVT seen on physical exam. Negative Homan's sign. No cords or calf tenderness. No significant calf/ankle edema.  Discharge Diagnoses: Term Pregnancy-delivered  Discharge Information: Date: 09/22/2012 Activity: unrestricted and pelvic rest Diet: routine Medications: PNV, Ibuprofen, Colace and Iron Condition: stable Instructions: refer to practice specific booklet Discharge to: home   Newborn Data: Live born female  Birth Weight: 6 lb 15.6 oz (3164 g) APGAR: 9, 9  Home with mother.  [name], ERIN 09/22/2012, 6:20 AM  I saw and examined patient and agree with above resident note. I reviewed history, delivery summary, labs and vitals. Napoleon Form, MD

## 2012-09-24 NOTE — Discharge Summary (Signed)
Attestation of Attending Supervision of Advanced Practitioner (CNM/NP): Evaluation and management procedures were performed by the Advanced Practitioner under my supervision and collaboration.  I have reviewed the Advanced Practitioner's note and chart, and I agree with the management and plan.  Khamora Karan 09/24/2012 8:54 AM

## 2012-10-24 ENCOUNTER — Ambulatory Visit (INDEPENDENT_AMBULATORY_CARE_PROVIDER_SITE_OTHER): Payer: Medicaid Other | Admitting: Emergency Medicine

## 2012-10-24 ENCOUNTER — Encounter: Payer: Self-pay | Admitting: Emergency Medicine

## 2012-10-24 DIAGNOSIS — IMO0001 Reserved for inherently not codable concepts without codable children: Secondary | ICD-10-CM

## 2012-10-24 DIAGNOSIS — N898 Other specified noninflammatory disorders of vagina: Secondary | ICD-10-CM

## 2012-10-24 DIAGNOSIS — Z309 Encounter for contraceptive management, unspecified: Secondary | ICD-10-CM

## 2012-10-24 HISTORY — DX: Other specified noninflammatory disorders of vagina: N89.8

## 2012-10-24 LAB — POCT WET PREP (WET MOUNT)

## 2012-10-24 MED ORDER — METRONIDAZOLE 500 MG PO TABS: 500.0000 mg | ORAL_TABLET | Freq: Two times a day (BID) | ORAL | Status: DC

## 2012-10-24 NOTE — Progress Notes (Signed)
  Subjective:    Patient ID: Shannon Randolph, female    DOB: [birth year], 22 y.o.   MRN: [id number]  HPI Shannon Randolph is here for her post partum follow up.  She reports doing well, feels supported at home.  Denies any depression symptoms.  She would like to start birth control pills now and then get the Mirena at some point in the future.  She is currently sexually active, last sex was 1 week ago and was unprotected.  She continues to breast feed.  Has not really had a period yet.  Does have some vaginal discharge, yellowish and some itching.    PHQ-9: 0; not at all difficult   I have reviewed and updated the following as appropriate: allergies and current medications SHx: former smoker  Review of Systems See HPI    Objective:   Physical Exam BP 128/87  Pulse 92  Ht 5\' 2"  (1.575 m)  Wt 146 lb (66.225 kg)  BMI 26.7 kg/m2  Breastfeeding? Yes Gen: alert, cooperative, NAD HEENT: AT/[place], sclera white, MMM Neck: supple CV: RRR, no murmurs Pulm: CTAB, no wheezes or rales Abd: +BS, soft, NTND Ext: no edema, 2+ DP pulses bilaterally     Assessment & Plan:

## 2012-10-24 NOTE — Assessment & Plan Note (Addendum)
Doing well.  See other problems for details.  Depression screen negative.

## 2012-10-24 NOTE — Assessment & Plan Note (Signed)
Clue cells on wet prep.  Will treat with flagyl 500mg  BID x7 days.

## 2012-10-24 NOTE — Patient Instructions (Addendum)
It was nice to see you! NO SEX FOR THE NEXT 2 WEEKS!!!  If you do have sex - USE A CONDOM. Follow up in 2 weeks for repeat pregnancy test.  If negative, I will give you a prescription for birth control pills. Take Flagyl 500mg  BID x7 days for bacterial vaginosis.

## 2012-10-24 NOTE — Assessment & Plan Note (Signed)
Urine pregnancy negative today.  Given recent unprotected sex, will need repeat pregnancy test in 2 weeks.  If negative will start Micronor given that she is breast feeding.  Discussed extensively that this pills needs to be taken AT THE SAME TIME everyday.  She said that was okay.

## 2012-12-12 ENCOUNTER — Ambulatory Visit: Payer: Medicaid Other | Admitting: Emergency Medicine

## 2013-01-07 ENCOUNTER — Ambulatory Visit (INDEPENDENT_AMBULATORY_CARE_PROVIDER_SITE_OTHER): Payer: Medicaid Other | Admitting: Emergency Medicine

## 2013-01-07 ENCOUNTER — Telehealth: Payer: Self-pay | Admitting: Emergency Medicine

## 2013-01-07 ENCOUNTER — Encounter: Payer: Self-pay | Admitting: Emergency Medicine

## 2013-01-07 VITALS — BP 127/81 | HR 93 | Temp 98.4°F | Ht 63.0 in | Wt 147.8 lb

## 2013-01-07 DIAGNOSIS — N898 Other specified noninflammatory disorders of vagina: Secondary | ICD-10-CM

## 2013-01-07 DIAGNOSIS — Z309 Encounter for contraceptive management, unspecified: Secondary | ICD-10-CM

## 2013-01-07 DIAGNOSIS — IMO0001 Reserved for inherently not codable concepts without codable children: Secondary | ICD-10-CM

## 2013-01-07 LAB — POCT WET PREP (WET MOUNT): Clue Cells Wet Prep Whiff POC: POSITIVE

## 2013-01-07 MED ORDER — MEDROXYPROGESTERONE ACETATE 150 MG/ML IM SUSP
150.0000 mg | Freq: Once | INTRAMUSCULAR | Status: AC
  Administered 2013-01-07: 150 mg via INTRAMUSCULAR

## 2013-01-07 MED ORDER — PRENATAL MULTIVITAMIN CH: 1.0000 | ORAL_TABLET | Freq: Every day | ORAL | Status: DC

## 2013-01-07 MED ORDER — METRONIDAZOLE 500 MG PO TABS: 500.0000 mg | ORAL_TABLET | Freq: Two times a day (BID) | ORAL | Status: DC

## 2013-01-07 MED ORDER — MEDROXYPROGESTERONE ACETATE 150 MG/ML IM SUSP: 150.0000 mg | INTRAMUSCULAR | Status: DC

## 2013-01-07 NOTE — Assessment & Plan Note (Signed)
Currently menstruating and urine preg negative. Start depo today. Counseled on weight gain and irregular bleeding. Emphasized importance of return every 3 months for repeat injections.

## 2013-01-07 NOTE — Assessment & Plan Note (Signed)
History not concerning for systemic infection. Has had multiple episodes of BV in the past. Self wet prep collected today. Will call patient with results.

## 2013-01-07 NOTE — Patient Instructions (Addendum)
It was nice to see you!  You received your first depo shot today.  You need to come back for a nurse appt every 3 months for the shot.  I will call you with the results of the wet prep.  Follow up as needed.  Medroxyprogesterone injection [Contraceptive] What is this medicine? MEDROXYPROGESTERONE (me DROX ee proe JES te rone) contraceptive injections prevent pregnancy. They provide effective birth control for 3 months. Depo-subQ Provera 104 is also used for treating pain related to endometriosis. This medicine may be used for other purposes; ask your health care provider or pharmacist if you have questions. How should I use this medicine? Depo-Provera Contraceptive injection is given into a muscle. Depo-subQ Provera 104 injection is given under the skin. These injections are given by a health care professional. You must not be pregnant before getting an injection. The injection is usually given during the first 5 days after the start of a menstrual period or 6 weeks after delivery of a baby. Talk to your pediatrician regarding the use of this medicine in children. Special care may be needed. These injections have been used in female children who have started having menstrual periods. Overdosage: If you think you have taken too much of this medicine contact a poison control center or emergency room at once. NOTE: This medicine is only for you. Do not share this medicine with others. What if I miss a dose? Try not to miss a dose. You must get an injection once every 3 months to maintain birth control. If you cannot keep an appointment, call and reschedule it. If you wait longer than 13 weeks between Depo-Provera contraceptive injections or longer than 14 weeks between Depo-subQ Provera 104 injections, you could get pregnant. Use another method for birth control if you miss your appointment. You may also need a pregnancy test before receiving another injection. What may interact with this  medicine? Do not take this medicine with any of the following medications: -bosentan This medicine may also interact with the following medications: -aminoglutethimide -antibiotics or medicines for infections, especially rifampin, rifabutin, rifapentine, and griseofulvin -aprepitant -barbiturate medicines such as phenobarbital or primidone -bexarotene -carbamazepine -medicines for seizures like ethotoin, felbamate, oxcarbazepine, phenytoin, topiramate -modafinil -St. John's wort This list may not describe all possible interactions. Give your health care provider a list of all the medicines, herbs, non-prescription drugs, or dietary supplements you use. Also tell them if you smoke, drink alcohol, or use illegal drugs. Some items may interact with your medicine. What should I watch for while using this medicine? This drug does not protect you against HIV infection (AIDS) or other sexually transmitted diseases. Use of this product may cause you to lose calcium from your bones. Loss of calcium may cause weak bones (osteoporosis). Only use this product for more than 2 years if other forms of birth control are not right for you. The longer you use this product for birth control the more likely you will be at risk for weak bones. Ask your health care professional how you can keep strong bones. You may have a change in bleeding pattern or irregular periods. Many females stop having periods while taking this drug. If you have received your injections on time, your chance of being pregnant is very low. If you think you may be pregnant, see your health care professional as soon as possible. Tell your health care professional if you want to get pregnant within the next year. The effect of this medicine may last  a long time after you get your last injection. What side effects may I notice from receiving this medicine? Side effects that you should report to your doctor or health care professional as soon as  possible: -allergic reactions like skin rash, itching or hives, swelling of the face, lips, or tongue -breast tenderness or discharge -breathing problems -changes in vision -depression -feeling faint or lightheaded, falls -fever -pain in the abdomen, chest, groin, or leg -problems with balance, talking, walking -unusually weak or tired -yellowing of the eyes or skin Side effects that usually do not require medical attention (report to your doctor or health care professional if they continue or are bothersome): -acne -fluid retention and swelling -headache -irregular periods, spotting, or absent periods -temporary pain, itching, or skin reaction at site where injected -weight gain This list may not describe all possible side effects. Call your doctor for medical advice about side effects. You may report side effects to FDA at 1-800-FDA-1088. Where should I keep my medicine? This does not apply. The injection will be given to you by a health care professional. NOTE: This sheet is a summary. It may not cover all possible information. If you have questions about this medicine, talk to your doctor, pharmacist, or health care provider.  2013, Elsevier/Gold Standard. (06/04/2008 6:37:56 PM)

## 2013-01-07 NOTE — Telephone Encounter (Signed)
Wet prep shows clue cells.  Will treat with Flagyl 500mg  BID x7 days.  2 refills given as she has had this multiple times in the past confirmed by wet prep.  Discussed with patient who understands and agrees with plan.  If discharge does not resolve after medication, she will need an appt to see me.  Also discussed conservative measures of no scents or dyes and cotton underwear.

## 2013-01-07 NOTE — Progress Notes (Signed)
Subjective:     Patient ID: Shannon Randolph, female   DOB: 04/28/1991, 22 y.o.   MRN: [id number]  HPI This is a 22 yo woman G2P2002, w/ hx of recurrent BV in clinic today for birth control and vaginal d/c. Vaginal d/c began 1 week ago. She describes the d/c as white, non-odorous, occuring in small quantities. It has not changed over the week. She states that she has had this before and believes it is her "pH balance." Denies polyuria, dysuria, hematuria, itching, vaginal bleeding, dyspareunia, pelvic pain. Has not used new soaps, underwear or performed any douching. Her usual underwear is not always made of cotton and she uses Dove scented soap to clean her genital area.  Pt desires to start Depo provera.   LMP was 01/04/2013. Currently on period. Periods described as regular, consistent, and normal flow that last for 3-4 days.   Currently sexually active. Last sexual intercourse was 4 days ago. Does not use any forms of contraception currently, i.e. Condoms or other barrier methods. She is no longer breastfeeding.  Review of Systems General: No fevers, chills, sweats, N/V.  Rest as per HPI    Objective:   Physical Exam BP 127/81  Pulse 93  Temp(Src) 98.4 F (36.9 C) (Oral)  Ht 5\' 3"  (1.6 m)  Wt 147 lb 12.8 oz (67.042 kg)  BMI 26.19 kg/m2  LMP 01/04/2013  Breastfeeding? No General: NAD. A&Ox3. Speaks in full sentences and answers questions appropriately.   CV: RRR. Normal S1 and S2. No murmurs, rubs, or gallops. Radial pulses 2+ bilaterally. No peripheral edema.   Pulm: All lung fields clear and equal to auscultation bilaterally.  Upreg:Neg. Wet prep: pending  Problem List: 1. Birth control 2. Vaginal d/c    Assessment:     This is  22 yo woman G2P2002, w/ hx of recurrent BV w/ vaginal d/c.    Plan:     1. Birth control - Upreg negative and pt currently on period. Will begin administering Depo provera today. Pt counseled on possible irregular bleeding, wt gain, and  scheduling of shot.   2.  Vaginal d/c - Lack of pain, pelvic symptoms, itchiness, urinary symptoms, etc make GC/chlamydia, trich, and candidiasis unlikely. Given recurrent hx and pt's belief that this is another episode of BV this is a likely cause for d/c. Will do wet prep - pending results, will treat BV w/ metronidazole 500 mg bid for 7 days. Pt will be called once results are returned.

## 2013-03-06 ENCOUNTER — Encounter: Payer: Medicaid Other | Admitting: Family Medicine

## 2013-03-09 NOTE — Progress Notes (Signed)
This encounter was created in error - please disregard.

## 2013-04-08 ENCOUNTER — Ambulatory Visit (INDEPENDENT_AMBULATORY_CARE_PROVIDER_SITE_OTHER): Payer: Medicaid Other | Admitting: *Deleted

## 2013-04-08 DIAGNOSIS — Z309 Encounter for contraceptive management, unspecified: Secondary | ICD-10-CM

## 2013-07-03 MED ORDER — MEDROXYPROGESTERONE ACETATE 150 MG/ML IM SUSP
150.0000 mg | Freq: Once | INTRAMUSCULAR | Status: AC
  Administered 2013-04-08: 150 mg via INTRAMUSCULAR

## 2013-07-03 NOTE — Progress Notes (Signed)
   Patient here today for Depo Provera injection.  Depo given today LUOQ.  Site unremarkable & patient tolerated injection.  Next injection due Feb. Feb. 4-18, 2015.  Reminder card given.  Altamese Dilling~Jeannette Richardson, BSN, RN-BC

## 2013-08-04 ENCOUNTER — Ambulatory Visit (INDEPENDENT_AMBULATORY_CARE_PROVIDER_SITE_OTHER): Payer: Medicaid Other | Admitting: *Deleted

## 2013-08-04 DIAGNOSIS — Z309 Encounter for contraceptive management, unspecified: Secondary | ICD-10-CM

## 2013-08-04 DIAGNOSIS — IMO0001 Reserved for inherently not codable concepts without codable children: Secondary | ICD-10-CM

## 2013-08-04 LAB — POCT URINE PREGNANCY: PREG TEST UR: NEGATIVE

## 2013-08-04 MED ORDER — MEDROXYPROGESTERONE ACETATE 150 MG/ML IM SUSP: 150.0000 mg | Freq: Once | INTRAMUSCULAR | Status: DC

## 2013-08-04 NOTE — Progress Notes (Signed)
   Pt in for Depo Injection, but declined today.  Pt wanted to discuss starting on pills  Per pt Depo it is making her fat.  Appt scheduled for 08/27/2013 with PCP at 4 pm.  Pregnancy test ordered, results negative.  Clovis PuMartin, Tamika L, RN

## 2013-08-27 ENCOUNTER — Ambulatory Visit: Payer: Medicaid Other | Admitting: Emergency Medicine

## 2014-01-12 ENCOUNTER — Other Ambulatory Visit (HOSPITAL_COMMUNITY)
Admission: RE | Admit: 2014-01-12 | Discharge: 2014-01-12 | Disposition: A | Discharge status: Home / Self Care | Payer: Medicaid Other | Source: Ambulatory Visit | Attending: Family Medicine | Admitting: Family Medicine

## 2014-01-12 ENCOUNTER — Encounter: Payer: Self-pay | Admitting: Family Medicine

## 2014-01-12 ENCOUNTER — Ambulatory Visit (INDEPENDENT_AMBULATORY_CARE_PROVIDER_SITE_OTHER): Payer: Medicaid Other | Admitting: Family Medicine

## 2014-01-12 VITALS — BP 113/72 | HR 65 | Temp 98.6°F | Ht 63.0 in | Wt 160.3 lb

## 2014-01-12 DIAGNOSIS — B9689 Other specified bacterial agents as the cause of diseases classified elsewhere: Secondary | ICD-10-CM

## 2014-01-12 DIAGNOSIS — A499 Bacterial infection, unspecified: Secondary | ICD-10-CM

## 2014-01-12 DIAGNOSIS — N920 Excessive and frequent menstruation with regular cycle: Secondary | ICD-10-CM

## 2014-01-12 DIAGNOSIS — Z113 Encounter for screening for infections with a predominantly sexual mode of transmission: Secondary | ICD-10-CM | POA: Diagnosis present

## 2014-01-12 DIAGNOSIS — N898 Other specified noninflammatory disorders of vagina: Secondary | ICD-10-CM

## 2014-01-12 DIAGNOSIS — N9489 Other specified conditions associated with female genital organs and menstrual cycle: Secondary | ICD-10-CM

## 2014-01-12 DIAGNOSIS — N760 Acute vaginitis: Secondary | ICD-10-CM

## 2014-01-12 LAB — POCT WET PREP (WET MOUNT)
CLUE CELLS WET PREP WHIFF POC: POSITIVE
WBC, Wet Prep HPF POC: >20

## 2014-01-12 LAB — POCT URINE PREGNANCY: Preg Test, Ur: NEGATIVE

## 2014-01-12 MED ORDER — METRONIDAZOLE 500 MG PO TABS: 500.0000 mg | ORAL_TABLET | Freq: Two times a day (BID) | ORAL | Status: DC

## 2014-01-12 NOTE — Assessment & Plan Note (Signed)
Recurrent problem. Wet prep suggest bacterial vaginosis. Will prescribe metronidazole x7 days. Discussed ways to hopefully prevent recurrence. Patient identified taking daily baths. This may be contributing. Agreed to decrease number of baths to see if this resolves her issue. Will follow-up with PCP as needed.

## 2014-01-12 NOTE — Progress Notes (Signed)
   Subjective:    Patient ID: Shannon Randolph, female    DOB: [birth year], 23 y.o.   MRN: [id number]  HPI  Foul vaginal odor: Patient reports foul odor for one week. She has some watery discharge. No vaginal bleeding. No pelvic pain. No fever, nausea or vomiting.  History  Substance Use Topics  . Smoking status: Former Smoker    Quit date: 01/11/2012  . Smokeless tobacco: Former NeurosurgeonUser    Quit date: 01/11/2012  . Alcohol Use: No   Review of Systems Per HPI    Objective:   Physical Exam  Gen: well appearing female GU: watery discharge present that is pooled in the vagina. Blood present in vagina. Do not see blood eminating from cervical os. No lesions noted on labia, in vagina or on cervix. No CMT, adnexal fullness or tender uterus. Wet prep and GC/Chlamydia obtained.     Assessment & Plan:

## 2014-01-12 NOTE — Patient Instructions (Addendum)
Shannon HouseholderRashanda Randolph, it was a pleasure seeing you today. Today we talked about your odor. It looks like you have an overgrowth bacteria. I will prescribe an antibiotic for you to take for 7 days.   Please make an appointment to see your primary care physician, Dr. Ermalinda MemosBradshaw, in 4 weeks.  If you have any questions or concerns, please do not hesitate to call the office at [phone].  Sincerely,  Jacquelin Hawkingalph [name], MD   Bacterial Vaginosis Bacterial vaginosis is a vaginal infection that occurs when the normal balance of bacteria in the vagina is disrupted. It results from an overgrowth of certain bacteria. This is the most common vaginal infection in women of childbearing age. Treatment is important to prevent complications, especially in pregnant women, as it can cause a premature delivery. CAUSES  Bacterial vaginosis is caused by an increase in harmful bacteria that are normally present in smaller amounts in the vagina. Several different kinds of bacteria can cause bacterial vaginosis. However, the reason that the condition develops is not fully understood. RISK FACTORS Certain activities or behaviors can put you at an increased risk of developing bacterial vaginosis, including:  Having a new sex partner or multiple sex partners.  Douching.  Using an intrauterine device (IUD) for contraception. Women do not get bacterial vaginosis from toilet seats, bedding, swimming pools, or contact with objects around them. SIGNS AND SYMPTOMS  Some women with bacterial vaginosis have no signs or symptoms. Common symptoms include:  Grey vaginal discharge.  A fishlike odor with discharge, especially after sexual intercourse.  Itching or burning of the vagina and vulva.  Burning or pain with urination. DIAGNOSIS  Your health care provider will take a medical history and examine the vagina for signs of bacterial vaginosis. A sample of vaginal fluid may be taken. Your health care provider will look at  this sample under a microscope to check for bacteria and abnormal cells. A vaginal pH test may also be done.  TREATMENT  Bacterial vaginosis may be treated with antibiotic medicines. These may be given in the form of a pill or a vaginal cream. A second round of antibiotics may be prescribed if the condition comes back after treatment.  HOME CARE INSTRUCTIONS   Only take over-the-counter or prescription medicines as directed by your health care provider.  If antibiotic medicine was prescribed, take it as directed. Make sure you finish it even if you start to feel better.  Do not have sex until treatment is completed.  Tell all sexual partners that you have a vaginal infection. They should see their health care provider and be treated if they have problems, such as a mild rash or itching.  Practice safe sex by using condoms and only having one sex partner. SEEK MEDICAL CARE IF:   Your symptoms are not improving after 3 days of treatment.  You have increased discharge or pain.  You have a fever. MAKE SURE YOU:   Understand these instructions.  Will watch your condition.  Will get help right away if you are not doing well or get worse. FOR MORE INFORMATION  Centers for Disease Control and Prevention, Division of STD Prevention: SolutionApps.co.zawww.cdc.gov/std American Sexual Health Association (ASHA): www.ashastd.org  Document Released: 05/14/2005 Document Revised: 03/04/2013 Document Reviewed: 12/24/2012 [facility] Patient Information 2015 [place]. This information is not intended to replace advice given to you by your health care provider. Make sure you discuss any questions you have with your health care provider.

## 2014-01-14 ENCOUNTER — Telehealth: Payer: Self-pay | Admitting: *Deleted

## 2014-01-14 NOTE — Telephone Encounter (Signed)
Spoke with patient and informed her of below 

## 2014-01-14 NOTE — Telephone Encounter (Signed)
Message copied by Farrell OursEVANS, Illiana Losurdo K on Thu Jan 14, 2014 10:14 AM ------      Message from: Jacquelin HawkingNETTEY, RALPH A      Created: Wed Jan 13, 2014  8:05 PM       Please inform patient that gonorrhea and chlamydia was negative. ------

## 2014-03-29 ENCOUNTER — Encounter: Payer: Self-pay | Admitting: Family Medicine

## 2014-03-31 ENCOUNTER — Emergency Department (HOSPITAL_COMMUNITY): Payer: Medicaid Other

## 2014-03-31 ENCOUNTER — Inpatient Hospital Stay (HOSPITAL_COMMUNITY)
Admission: EM | Admit: 2014-03-31 | Discharge: 2014-04-02 | DRG: 872 | Disposition: A | Discharge status: Home / Self Care | Payer: Medicaid Other | Attending: Family Medicine | Admitting: Family Medicine

## 2014-03-31 ENCOUNTER — Encounter (HOSPITAL_COMMUNITY): Payer: Self-pay

## 2014-03-31 DIAGNOSIS — N730 Acute parametritis and pelvic cellulitis: Secondary | ICD-10-CM

## 2014-03-31 DIAGNOSIS — A419 Sepsis, unspecified organism: Secondary | ICD-10-CM | POA: Diagnosis not present

## 2014-03-31 DIAGNOSIS — B9620 Unspecified Escherichia coli [E. coli] as the cause of diseases classified elsewhere: Secondary | ICD-10-CM | POA: Diagnosis present

## 2014-03-31 DIAGNOSIS — N898 Other specified noninflammatory disorders of vagina: Secondary | ICD-10-CM | POA: Diagnosis not present

## 2014-03-31 DIAGNOSIS — D72829 Elevated white blood cell count, unspecified: Secondary | ICD-10-CM

## 2014-03-31 DIAGNOSIS — E860 Dehydration: Secondary | ICD-10-CM | POA: Diagnosis present

## 2014-03-31 DIAGNOSIS — N739 Female pelvic inflammatory disease, unspecified: Secondary | ICD-10-CM | POA: Diagnosis present

## 2014-03-31 DIAGNOSIS — Z87891 Personal history of nicotine dependence: Secondary | ICD-10-CM | POA: Diagnosis not present

## 2014-03-31 DIAGNOSIS — N390 Urinary tract infection, site not specified: Secondary | ICD-10-CM | POA: Insufficient documentation

## 2014-03-31 DIAGNOSIS — IMO0001 Reserved for inherently not codable concepts without codable children: Secondary | ICD-10-CM | POA: Insufficient documentation

## 2014-03-31 DIAGNOSIS — R000 Tachycardia, unspecified: Secondary | ICD-10-CM

## 2014-03-31 LAB — COMPREHENSIVE METABOLIC PANEL
ALBUMIN: 3.7 g/dL (ref 3.5–5.2)
ALT: 11 U/L (ref 0–35)
ANION GAP: 11 (ref 5–15)
AST: 11 U/L (ref 0–37)
Alkaline Phosphatase: 74 U/L (ref 39–117)
BUN: 6 mg/dL (ref 6–23)
CALCIUM: 9 mg/dL (ref 8.4–10.5)
CO2: 24 mEq/L (ref 19–32)
CREATININE: 0.69 mg/dL (ref 0.50–1.10)
Chloride: 100 mEq/L (ref 96–112)
GFR calc Af Amer: >90 mL/min (ref >90)
GFR calc non Af Amer: >90 mL/min (ref >90)
Glucose, Bld: 105 mg/dL — ABNORMAL HIGH (ref 70–99)
Potassium: 4 mEq/L (ref 3.7–5.3)
Sodium: 135 mEq/L — ABNORMAL LOW (ref 137–147)
TOTAL PROTEIN: 7.2 g/dL (ref 6.0–8.3)
Total Bilirubin: 0.4 mg/dL (ref 0.3–1.2)

## 2014-03-31 LAB — URINALYSIS, ROUTINE W REFLEX MICROSCOPIC
BILIRUBIN URINE: NEGATIVE
GLUCOSE, UA: NEGATIVE mg/dL
Ketones, ur: NEGATIVE mg/dL
Nitrite: NEGATIVE
PROTEIN: 30 mg/dL — AB
Specific Gravity, Urine: 1.011 (ref 1.005–1.030)
Urobilinogen, UA: 0.2 mg/dL (ref 0.0–1.0)
pH: 8 (ref 5.0–8.0)

## 2014-03-31 LAB — PREGNANCY, URINE: PREG TEST UR: NEGATIVE

## 2014-03-31 LAB — URINE MICROSCOPIC-ADD ON

## 2014-03-31 LAB — CBC
HCT: 40.3 % (ref 36.0–46.0)
HEMOGLOBIN: 13.9 g/dL (ref 12.0–15.0)
MCH: 29 pg (ref 26.0–34.0)
MCHC: 34.5 g/dL (ref 30.0–36.0)
MCV: 84 fL (ref 78.0–100.0)
Platelets: 286 10*3/uL (ref 150–400)
RBC: 4.8 MIL/uL (ref 3.87–5.11)
RDW: 13.4 % (ref 11.5–15.5)
WBC: 21.2 10*3/uL — ABNORMAL HIGH (ref 4.0–10.5)

## 2014-03-31 LAB — WET PREP, GENITAL
Clue Cells Wet Prep HPF POC: NONE SEEN
Trich, Wet Prep: NONE SEEN
Yeast Wet Prep HPF POC: NONE SEEN

## 2014-03-31 LAB — I-STAT CG4 LACTIC ACID, ED: LACTIC ACID, VENOUS: 1 mmol/L (ref 0.5–2.2)

## 2014-03-31 LAB — LIPASE, BLOOD: Lipase: 17 U/L (ref 11–59)

## 2014-03-31 MED ORDER — HYDROMORPHONE HCL 1 MG/ML IJ SOLN
1.0000 mg | Freq: Once | INTRAMUSCULAR | Status: AC
  Administered 2014-03-31: 1 mg via INTRAVENOUS
  Filled 2014-03-31: qty 1

## 2014-03-31 MED ORDER — SODIUM CHLORIDE 0.9 % IV BOLUS (SEPSIS)
1000.0000 mL | Freq: Once | INTRAVENOUS | Status: AC
  Administered 2014-03-31: 1000 mL via INTRAVENOUS

## 2014-03-31 MED ORDER — DOXYCYCLINE HYCLATE 100 MG PO TABS
100.0000 mg | ORAL_TABLET | Freq: Once | ORAL | Status: AC
  Administered 2014-03-31: 100 mg via ORAL
  Filled 2014-03-31: qty 1

## 2014-03-31 MED ORDER — CEFTRIAXONE SODIUM 250 MG IJ SOLR
250.0000 mg | Freq: Once | INTRAMUSCULAR | Status: AC
  Administered 2014-03-31: 250 mg via INTRAMUSCULAR
  Filled 2014-03-31: qty 250

## 2014-03-31 MED ORDER — ACETAMINOPHEN 325 MG PO TABS
650.0000 mg | ORAL_TABLET | Freq: Four times a day (QID) | ORAL | Status: DC | PRN
  Administered 2014-03-31: 650 mg via ORAL
  Filled 2014-03-31: qty 2

## 2014-03-31 MED ORDER — MORPHINE SULFATE 4 MG/ML IJ SOLN
4.0000 mg | Freq: Once | INTRAMUSCULAR | Status: AC
  Administered 2014-03-31: 4 mg via INTRAVENOUS
  Filled 2014-03-31: qty 1

## 2014-03-31 MED ORDER — IBUPROFEN 800 MG PO TABS
800.0000 mg | ORAL_TABLET | Freq: Once | ORAL | Status: AC
  Administered 2014-03-31: 800 mg via ORAL
  Filled 2014-03-31: qty 1

## 2014-03-31 MED ORDER — ONDANSETRON HCL 4 MG/2ML IJ SOLN
4.0000 mg | Freq: Once | INTRAMUSCULAR | Status: AC
  Administered 2014-03-31: 4 mg via INTRAVENOUS
  Filled 2014-03-31: qty 2

## 2014-03-31 NOTE — ED Notes (Signed)
Patient ambulated to restroom, minimal assistance.

## 2014-03-31 NOTE — H&P (Signed)
[facility] History and Physical Service Pager: [phone]  Patient name: Shannon SlickerRashanda Randolph Medical record number: [id number] Date of birth: [birth year] Age: 23 y.o. Gender: female  Primary Care Provider: Kevin FentonBradshaw, Samuel, MD Consultants: None Code Status: FULL per discussion on admission  Chief Complaint: Abdominal pain, back pain  Assessment and Plan: Shannon Randolph is a 23 y.o. female presenting with lumbar pain, lower abdominal pain, and vaginal discharge noted to have cervical motion tenderness and pus from the cervical os. PMH is significant for recurrent bacterial vaginosis and a h/o gonorrhea approximately 4 years ago.  #Vaginal discharge, lower abdominal pain, lumbar pain: Patient's symptoms and physical exam findings (pus from the cervical os and cervical motion tenderness) from [facility] are most consistent with PID.  She had a low grade temperature at the OSH, was tachycardic to the 130s, and had a leukocytosis of 21.2.  Other things on the DDx include a ruptured ovarian cyst (however I would expect this to be more unilateral pain), cystitis (U/A with moderate LE and WBC, but negative nitrite), or some gastrointestinal etiology (such as appendicitis). Urine pregnancy negative therefore much less likely an ectopic pregnancy. - Place in observation, Family medicine teaching service, Dr. Mauricio PoBreen - Cefotan 2g IV q12hrs and doxycycline 100mg  PO q 12hrs - Pt will need to be on doxycycline x 14 days - F/U [facility] - Will order HIV and RPR - Repeat CBC in AM - [facility] - If patient does not improve or worsens, consider CT abdomen/pelvis to rule out GI etiologies.  - Continue to monitor for complications of PID, such as Tresa GarterFitz Huge Curtis - Norco q4hrs PRN pain and Diluadid PRN breakthrough pain  - Zofran PRN nausea - Motrin PRN fever   #Tachycardia: Most likely secondary to  dehydration  - s/p 3L NS boluses - NS @ 100cc/hr  FEN/GI: NS @ 100cc/hr, regular diet, zofran PRN nausea  Prophylaxis: Lovenox, SCDs  Disposition: Place in observation; FMTS  History of Present Illness: Shannon HouseholderRashanda Turnley is a 23 y.o. female presenting with back pain and lower abdominal pain since x 2 days which is worse with movement. She's had malodorous urine and increased vaginal discharge that is white and sometimes yellow in color and malodorous as well.  She endorses dysuria and urinary frequency. Today, she began to have subjective fevers and nausea. She denies any emesis. She has been unable to take anything by PO, however is now feeling like she may have an appetite.  Of note, she was recently treated with Flagyl for BV and states she has recurrent BV infections. She had gonorrhea 4 years ago during her last pregnancy but was treated and her last test in 2012 was negative. She is in a monogamous relationship with her boyfriend and they use condoms consistently.    [facility] she was found to be febrile to 100.6 and tachycardic to the 120-130s.  A pelvic exam was preformed by Dr. Karma GanjaLinker that revealed cervical motion tenderness and "pus from the cervical os." A transvaginal U/S revealed a small amount of free pelvic fluid that could indicate a recent ovarian cyst rupture, without evidence of intrauterine or extrauterine pelvic or adnexal masses. She was given ceftriaxone and doxycycline. She received 1L NS bolus x 3 without improvement in her tachycardia and multiple doses of morphine 4mg  (x2) and dilaudid 1mg  (x4) without improvement in pain.    Review  Of Systems: Per HPI with the following additions: Headaches that started today. Last BM was yesterday and was normal Otherwise 12 point review of systems was performed and was unremarkable.  Patient Active Problem List   Diagnosis Date Noted  . Vaginal discharge 10/24/2012  . LBP (low back pain) 10/18/2011  . Contraception  11/01/2010  . HIP PAIN, LEFT 02/22/2010  . 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 TOBACCO ABUSE 04/07/2009   Past Medical History: Past Medical History  Diagnosis Date  . No pertinent past medical history   . Infection     UTI, gonorrhea  . Bacterial vaginosis    Past Surgical History: Past Surgical History  Procedure Laterality Date  . No past surgeries     Social History: History  Substance Use Topics  . Smoking status: Former Smoker    Quit date: 01/11/2012  . Smokeless tobacco: Former NeurosurgeonUser    Quit date: 01/11/2012  . Alcohol Use: No   Additional social history: No alcohol or drug abuse. Last smoked cigarettes in 2013.   Please also refer to relevant sections of EMR.  Family History: Family History  Problem Relation Age of Onset  . Hearing loss Neg Hx    Allergies and Medications: No Known Allergies  Medications: None   Objective: BP 96/72 mmHg  Pulse 126  Temp(Src) 99.3 F (37.4 C) (Oral)  Resp 18  Ht 5\' 3"  (1.6 m)  Wt 165 lb (74.844 kg)  BMI 29.24 kg/m2  SpO2 100%  LMP 03/18/2014 (Exact Date)  Breastfeeding? No Exam: General: Slightly uncomfortable appearing, with her right leg shaking as she's lying in bed. Not toxic appearing  HEENT: Atraumatic, normocephalic. Dry MM. Oropharynx clear without lesions. No conjunctival injection. No scleral icterus.  PERRL, EOMI, No nasal drainage.  Cardiovascular: Tachycardic, regular rhythm. No m/r/g noted. Respiratory: No increased WOB. CTAB without wheezing, rhonchi, or crackles  Abdomen: +BS, soft, non-distended. TTP most prominent in the lower quadrants bilaterally. Some TTP in the left lumbar/hypocondraic regions. No rebound or guarding noted.  [facility]: " normal external genitalia, vulva, vagina, pus coming from cervical os, + CMT, + adnexal tenderness bilaterally" Minimal inguinal lymphadenopathy. Extremities: No gross deformities. No edema  Back: No tenderness over the spinal processes.  Mild CVA tenderness bilaterally Skin: No rashes noted.  Neuro: A&Ox4. Speech clear and coherent. Facial movements intact. No gross neurologic deficits. Gait not= assessed.   Labs and Imaging: CBC BMET   Recent Labs Lab 03/31/14 1540  WBC 21.2*  HGB 13.9  HCT 40.3  PLT 286    Recent Labs Lab 03/31/14 1645  NA 135*  K 4.0  CL 100  CO2 24  BUN 6  CREATININE 0.69  GLUCOSE 105*  CALCIUM 9.0    Urine pregnancy: negative  Lactic acid: 1.0 U/A: Negative for nitrite. Positive for LE, 21-50 WBCs, 2-6RBC, rare bacteria and squamous cells Wet prep: Few WBCs, no yeast, trich, or clue cells noted  Transvaginal U/S: Fairly small amount of free pelvic fluid. This finding could indicate recent ovarian cyst rupture. There is no intrauterine or extrauterine pelvic or adnexal mass.  Joanna Puffrystal S Kadie Balestrieri, MD 03/31/2014, 10:31 PM PGY-1, [facility] FPTS Intern pager: [phone], text pages welcome

## 2014-03-31 NOTE — ED Notes (Signed)
Medicated as ordered and NS fluid bolus infusing

## 2014-03-31 NOTE — ED Notes (Signed)
Pt. On cardiac monitor. 

## 2014-03-31 NOTE — ED Notes (Signed)
US at bedside

## 2014-03-31 NOTE — ED Notes (Addendum)
Pt reports 10/10 lower back pain that radiates to her abd that started on 11/1. Pt reports pain radiation bilaterally down her thighs. Pt reports being diagnosed with Strep Throat 3 weeks ago. Pt reports taking prescribed antibiotics for 3 days, not the prescribed 10 days. Pt denies a change in urinary frequency. Pt reports increased discharge and odor upon urination. Pt reports pain upon bending down and turing side to side at the waist. Pt reports no appetite.

## 2014-03-31 NOTE — ED Provider Notes (Signed)
CSN: [id number]     Arrival date & time 03/31/14  1455 History   First MD Initiated Contact with Patient 03/31/14 1527     Chief Complaint  Patient presents with  . Back Pain  . Fever     (Consider location/radiation/quality/duration/timing/severity/associated sxs/prior Treatment) HPI  Pt presents with c/o abdominal pain and back pain.  Pt states that pain began 2 days ago.  Pain in back is worse with movement.  She also c/o lower abdominal pain.  She states she has foul smelling urine and increased vaginal discharge.  She began to run low grade fever and have nausea today.  Pt was recently treated with flagyl for BV and also was treated with abx for strep throat infection approx 3 weeks ago- she finished 3 of the 10 day course because she felt improved.  No vomiting or diarrhea.  Has had some nausea beginning today.  Has not taken anything for her symptoms today.  There are no other associated systemic symptoms, there are no other alleviating or modifying factors.   Past Medical History  Diagnosis Date  . No pertinent past medical history   . Infection     UTI, gonorrhea  . Bacterial vaginosis    Past Surgical History  Procedure Laterality Date  . No past surgeries     Family History  Problem Relation Age of Onset  . Hearing loss Neg Hx    History  Substance Use Topics  . Smoking status: Former Smoker    Quit date: 01/11/2012  . Smokeless tobacco: Former Neurosurgeon    Quit date: 01/11/2012  . Alcohol Use: No   OB History    Gravida Para Term Preterm AB TAB SAB Ectopic Multiple Living   2 2 2       2      Review of Systems  ROS reviewed and all otherwise negative except for mentioned in HPI    Allergies  Review of patient's allergies indicates no known allergies.  Home Medications   Prior to Admission medications   Medication Sig Start Date End Date Taking? Authorizing Provider  ibuprofen (ADVIL,MOTRIN) 600 MG tablet Take 1 tablet (600 mg total) by mouth every 6 (six)  hours. 09/22/12  Yes Charm Rings, MD  ferrous sulfate 325 (65 FE) MG tablet Take 1 tablet (325 mg total) by mouth daily with breakfast. 09/22/12   Charm Rings, MD  medroxyPROGESTERone (DEPO-PROVERA) 150 MG/ML injection Inject 1 mL (150 mg total) into the muscle every 3 (three) months. 01/07/13   Charm Rings, MD  metroNIDAZOLE (FLAGYL) 500 MG tablet Take 1 tablet (500 mg total) by mouth 2 (two) times daily. 01/12/14   Jacquelin Hawking, MD  Prenatal Vit-Fe Fumarate-FA (PRENATAL MULTIVITAMIN) TABS tablet Take 1 tablet by mouth daily. 01/07/13   Charm Rings, MD   BP 104/57 mmHg  Pulse 125  Temp(Src) 99.4 F (37.4 C) (Oral)  Resp 20  Ht 5\' 3"  (1.6 m)  Wt 165 lb (74.844 kg)  BMI 29.24 kg/m2  SpO2 100%  LMP 03/18/2014 (Exact Date)  Breastfeeding? No  Vitals reviewed Physical Exam  Physical Examination: General appearance - alert, uncomfortable appearing, and in no distress Mental status - alert, oriented to person, place, and time Eyes - no conjunctival injection, no scleral icterus Mouth - mucous membranes moist, pharynx normal without lesions Chest - clear to auscultation, no wheezes, rales or rhonchi, symmetric air entry Heart - normal rate, regular rhythm, normal S1, S2, no murmurs, rubs, clicks or  gallops Abdomen - soft, ttp in bilateral lower abdomen, no gaurding or rebound, nondistended, no masses or organomegaly, nabs Back- no midline tenderness of C/t/l spine, mild bilateral CVA tenderness Pelvic - normal external genitalia, vulva, vagina, pus coming from cervical os, + CMT, + adnexal tenderness bilaterally Extremities - peripheral pulses normal, no pedal edema, no clubbing or cyanosis Skin - normal coloration and turgor, no rashes  ED Course  Procedures (including critical care time)  10:36 PM pt with continued tachycardia and pain after multiple doses of IV pain meds, on 3rd liter if IV fluids.  Pt has been given rocephin and doxy for PID.  I have d/w Dr. Leonides Schanzorsey, family medicine  resident pt [facility], med/surg bed, Dr. Mauricio PoBreen attending.   Labs Review Labs Reviewed  WET PREP, GENITAL - Abnormal; Notable for the following:    WBC, Wet Prep HPF POC FEW (*)    All other components within normal limits  URINALYSIS, ROUTINE W REFLEX MICROSCOPIC - Abnormal; Notable for the following:    APPearance CLOUDY (*)    Hgb urine dipstick TRACE (*)    Protein, ur 30 (*)    Leukocytes, UA MODERATE (*)    All other components within normal limits  CBC - Abnormal; Notable for the following:    WBC 21.2 (*)    All other components within normal limits  COMPREHENSIVE METABOLIC PANEL - Abnormal; Notable for the following:    Sodium 135 (*)    Glucose, Bld 105 (*)    All other components within normal limits  GC/CHLAMYDIA PROBE AMP  PREGNANCY, URINE  URINE MICROSCOPIC-ADD ON  LIPASE, BLOOD  I-STAT CG4 LACTIC ACID, ED    Imaging Review Koreas Transvaginal Non-ob  03/31/2014   CLINICAL DATA:  Persistent lower abdominal and pelvic pain  EXAM: TRANSABDOMINAL AND TRANSVAGINAL ULTRASOUND OF PELVIS  TECHNIQUE: Study was performed transabdominally to optimize pelvic field of view evaluation and transvaginally to optimize internal visceral architecture evaluation.  COMPARISON:  None  FINDINGS: Uterus  Measurements: 8.7 x 4.0 x 5.2 cm. Uterus is anteverted. No fibroids or other mass visualized.  Endometrium  Thickness: 7 mm.  No focal abnormality visualized.  Right ovary  Measurements: 4.2 x 2.4 x 2.8 cm. Normal appearance/no adnexal mass.  Left ovary  Measurements: 2.5 x 1.6 x 3.0 cm. Normal appearance/no adnexal mass.  Other findings  There is a fairly small amount of free pelvic fluid.  IMPRESSION: Fairly small amount of free pelvic fluid. This finding could indicate recent ovarian cyst rupture. There is no intrauterine or extrauterine pelvic or adnexal mass.   Electronically Signed   By: Bretta BangWilliam  Woodruff M.D.   On: 03/31/2014 21:58   [place] Pelvis  Complete  03/31/2014   CLINICAL DATA:  Persistent lower abdominal and pelvic pain  EXAM: TRANSABDOMINAL AND TRANSVAGINAL ULTRASOUND OF PELVIS  TECHNIQUE: Study was performed transabdominally to optimize pelvic field of view evaluation and transvaginally to optimize internal visceral architecture evaluation.  COMPARISON:  None  FINDINGS: Uterus  Measurements: 8.7 x 4.0 x 5.2 cm. Uterus is anteverted. No fibroids or other mass visualized.  Endometrium  Thickness: 7 mm.  No focal abnormality visualized.  Right ovary  Measurements: 4.2 x 2.4 x 2.8 cm. Normal appearance/no adnexal mass.  Left ovary  Measurements: 2.5 x 1.6 x 3.0 cm. Normal appearance/no adnexal mass.  Other findings  There is a fairly small amount of free pelvic fluid.  IMPRESSION: Fairly small amount of free pelvic fluid.  This finding could indicate recent ovarian cyst rupture. There is no intrauterine or extrauterine pelvic or adnexal mass.   Electronically Signed   By: Bretta BangWilliam  Woodruff M.D.   On: 03/31/2014 21:58     EKG Interpretation None      MDM   Final diagnoses:  PID (acute pelvic inflammatory disease)  Leukocytosis  Tachycardia    Pt with lower abdominal pain and back pain, workup reveals tachycardia, ongoing pain after multiple rounds of pain meds, leukocytosis, ua with WBCs but no bacteria- pelvic exam is c/w PID- pus from os, + CMT.  Pt also with low grade fever, lactate normal.  Pt started on abx for PID in the ED, pt continuing to remain tachycardic despite 3Liters NS, and multiple rounds of pain meds- also given tylenol and ibuprofen for fever/pain.  [facility].    Ethelda ChickMartha K Linker, MD 03/31/14 [phone]

## 2014-04-01 ENCOUNTER — Encounter (HOSPITAL_COMMUNITY): Payer: Self-pay | Admitting: General Practice

## 2014-04-01 DIAGNOSIS — R000 Tachycardia, unspecified: Secondary | ICD-10-CM

## 2014-04-01 DIAGNOSIS — D72829 Elevated white blood cell count, unspecified: Secondary | ICD-10-CM

## 2014-04-01 DIAGNOSIS — N730 Acute parametritis and pelvic cellulitis: Secondary | ICD-10-CM

## 2014-04-01 LAB — HIV ANTIBODY (ROUTINE TESTING W REFLEX): HIV: NONREACTIVE

## 2014-04-01 LAB — CREATININE, SERUM
Creatinine, Ser: 0.63 mg/dL (ref 0.50–1.10)
GFR calc non Af Amer: >90 mL/min (ref >90)

## 2014-04-01 LAB — GC/CHLAMYDIA PROBE AMP
CT Probe RNA: NEGATIVE
GC PROBE AMP APTIMA: NEGATIVE

## 2014-04-01 LAB — CBC
HCT: 35.8 % — ABNORMAL LOW (ref 36.0–46.0)
HEMOGLOBIN: 11.7 g/dL — AB (ref 12.0–15.0)
MCH: 27.7 pg (ref 26.0–34.0)
MCHC: 32.7 g/dL (ref 30.0–36.0)
MCV: 84.6 fL (ref 78.0–100.0)
PLATELETS: 218 10*3/uL (ref 150–400)
RBC: 4.23 MIL/uL (ref 3.87–5.11)
RDW: 13.3 % (ref 11.5–15.5)
WBC: 14.9 10*3/uL — ABNORMAL HIGH (ref 4.0–10.5)

## 2014-04-01 LAB — RPR

## 2014-04-01 MED ORDER — HYDROMORPHONE HCL 1 MG/ML IJ SOLN
1.0000 mg | INTRAMUSCULAR | Status: DC | PRN
  Administered 2014-04-02: 1 mg via INTRAVENOUS
  Filled 2014-04-01: qty 1

## 2014-04-01 MED ORDER — SODIUM CHLORIDE 0.9 % IV SOLN
INTRAVENOUS | Status: DC
  Administered 2014-04-01 – 2014-04-02 (×3): via INTRAVENOUS

## 2014-04-01 MED ORDER — HYDROCODONE-ACETAMINOPHEN 5-325 MG PO TABS
1.0000 | ORAL_TABLET | ORAL | Status: DC | PRN
  Administered 2014-04-01 – 2014-04-02 (×4): 2 via ORAL
  Filled 2014-04-01 (×5): qty 2
  Filled 2014-04-01: qty 1

## 2014-04-01 MED ORDER — DOXYCYCLINE HYCLATE 100 MG IV SOLR
100.0000 mg | Freq: Two times a day (BID) | INTRAVENOUS | Status: DC
  Administered 2014-04-01: 100 mg via INTRAVENOUS
  Filled 2014-04-01 (×2): qty 100

## 2014-04-01 MED ORDER — ENOXAPARIN SODIUM 40 MG/0.4ML ~~LOC~~ SOLN
40.0000 mg | SUBCUTANEOUS | Status: DC
  Administered 2014-04-01: 40 mg via SUBCUTANEOUS
  Filled 2014-04-01: qty 0.4

## 2014-04-01 MED ORDER — DOCUSATE SODIUM 100 MG PO CAPS
100.0000 mg | ORAL_CAPSULE | Freq: Two times a day (BID) | ORAL | Status: DC
  Administered 2014-04-01 – 2014-04-02 (×4): 100 mg via ORAL
  Filled 2014-04-01 (×5): qty 1

## 2014-04-01 MED ORDER — ONDANSETRON HCL 4 MG/2ML IJ SOLN
4.0000 mg | Freq: Four times a day (QID) | INTRAMUSCULAR | Status: DC | PRN
  Administered 2014-04-01 – 2014-04-02 (×4): 4 mg via INTRAVENOUS
  Filled 2014-04-01 (×4): qty 2

## 2014-04-01 MED ORDER — KETOROLAC TROMETHAMINE 30 MG/ML IJ SOLN
30.0000 mg | Freq: Four times a day (QID) | INTRAMUSCULAR | Status: DC
  Administered 2014-04-01 – 2014-04-02 (×5): 30 mg via INTRAVENOUS
  Filled 2014-04-01 (×8): qty 1

## 2014-04-01 MED ORDER — ONDANSETRON HCL 4 MG PO TABS: 4.0000 mg | ORAL_TABLET | Freq: Four times a day (QID) | ORAL | Status: DC | PRN

## 2014-04-01 MED ORDER — DEXTROSE 5 % IV SOLN
2.0000 g | Freq: Two times a day (BID) | INTRAVENOUS | Status: DC
  Administered 2014-04-01 (×3): 2 g via INTRAVENOUS
  Filled 2014-04-01 (×5): qty 2

## 2014-04-01 MED ORDER — HYDROMORPHONE HCL 1 MG/ML IJ SOLN
1.0000 mg | INTRAMUSCULAR | Status: DC | PRN
  Administered 2014-04-01 (×4): 1 mg via INTRAVENOUS
  Filled 2014-04-01 (×4): qty 1

## 2014-04-01 MED ORDER — HEPARIN SODIUM (PORCINE) 5000 UNIT/ML IJ SOLN
5000.0000 [IU] | Freq: Three times a day (TID) | INTRAMUSCULAR | Status: DC
  Administered 2014-04-02 (×2): 5000 [IU] via SUBCUTANEOUS
  Filled 2014-04-01 (×5): qty 1

## 2014-04-01 MED ORDER — DOXYCYCLINE HYCLATE 100 MG PO TABS
100.0000 mg | ORAL_TABLET | Freq: Two times a day (BID) | ORAL | Status: DC
  Administered 2014-04-01: 100 mg via ORAL
  Filled 2014-04-01 (×2): qty 1

## 2014-04-01 MED ORDER — IBUPROFEN 600 MG PO TABS
600.0000 mg | ORAL_TABLET | Freq: Four times a day (QID) | ORAL | Status: DC | PRN
  Filled 2014-04-01: qty 1

## 2014-04-01 NOTE — Plan of Care (Signed)
Problem: Phase II Progression Outcomes Goal: Progress activity as tolerated unless otherwise ordered Outcome: Progressing Goal: Discharge plan established Outcome: Progressing Goal: Vital signs remain stable Outcome: Progressing Goal: Obtain order to discontinue catheter if appropriate Outcome: Not Applicable Date Met:  23/41/44

## 2014-04-01 NOTE — Progress Notes (Signed)
UR completed 

## 2014-04-01 NOTE — Progress Notes (Addendum)
Drug-Drug Interaction Report  NSAIDs / Heparin and Factor Xa inhibitors  Significance: Major  Warning: The risk of bleeding induced by enoxaparin may be increased by coadministration of ketorolac, including the development of procedure-related epidural or spinal hematomas. Additionally, heparin may reduce the efficacy of indomethacin when used to induce closure of a patent ductus arteriosus.  Onset: Rapid Document Level: Suspected  Interacting Medications/Orders:  NSAIDs Oral or Non-Oral, Systemic Heparin and Factor Xa inhibitors Non-oral, Systemic  1. ketorolac Order ([id number]): ketorolac (TORADOL) 30 MG/ML injection 30 mg Route: Intravenous Start: 04/01/2014 1200 End: 04/06/2014 1159 Frequency: Every 6 hours 1. enoxaparin Order ([id number]): enoxaparin (LOVENOX) injection 40 mg Route: Subcutaneous Start: 04/01/2014 1000 End: none Frequency: Every 24 hours   Management Code: Professional review suggested  Effects: ketorolac may increase the risk of bleeding induced by enoxaparin.  Mechanism: Pharmacologic effects of enoxaparin and ketorolac may be additive.  Management: The use of NSAIDs should be avoided during administration of enoxaparin unless clinically indicated. Acetaminophen may be a potential alternative for NSAIDs.  Nadara MustardNita Tamarra Geiselman, PharmD., MS Clinical Pharmacist Pager:  [phone] Thank you for allowing pharmacy to be part of this patients care team.

## 2014-04-01 NOTE — Progress Notes (Signed)
[facility] 3E30 [facility]. Pt alert and oriented, c/o 9/10 abdominal pain. Oriented to room, call bell placed within reach, admission assessment done. Will continue to monitor.   04/01/14 0052  Vitals  Temp 98 F (36.7 C)  Temp Source Oral  BP 113/69 mmHg  BP Location Left Arm  BP Method Automatic  Patient Position (if appropriate) Lying  Pulse Rate (!) 104  Pulse Rate Source Dinamap  Resp 20  Oxygen Therapy  SpO2 100 %  O2 Device Room Air  Height and Weight  Height 5\' 3"  (1.6 m)  Weight 74.7 kg (164 lb 10.9 oz)  Type of Scale Used Standing (scale b)  Type of Weight Actual  BSA (Calculated - sq m) 1.82 sq meters  BMI (Calculated) 29.2  Weight in (lb) to have BMI = 25 140.8

## 2014-04-01 NOTE — ED Notes (Signed)
Updated admission nurse that patient is on her way and has received 1 additional dose of Dilaudid 1mg  since report was given.

## 2014-04-01 NOTE — ED Notes (Signed)
carelink arrived to transport patient  

## 2014-04-02 DIAGNOSIS — N390 Urinary tract infection, site not specified: Secondary | ICD-10-CM

## 2014-04-02 DIAGNOSIS — IMO0001 Reserved for inherently not codable concepts without codable children: Secondary | ICD-10-CM | POA: Insufficient documentation

## 2014-04-02 DIAGNOSIS — A419 Sepsis, unspecified organism: Principal | ICD-10-CM

## 2014-04-02 LAB — CBC
HEMATOCRIT: 34.3 % — AB (ref 36.0–46.0)
Hemoglobin: 11.5 g/dL — ABNORMAL LOW (ref 12.0–15.0)
MCH: 27.8 pg (ref 26.0–34.0)
MCHC: 33.5 g/dL (ref 30.0–36.0)
MCV: 83.1 fL (ref 78.0–100.0)
Platelets: 210 10*3/uL (ref 150–400)
RBC: 4.13 MIL/uL (ref 3.87–5.11)
RDW: 12.8 % (ref 11.5–15.5)
WBC: 13.5 10*3/uL — AB (ref 4.0–10.5)

## 2014-04-02 MED ORDER — HYDROCODONE-ACETAMINOPHEN 5-325 MG PO TABS
1.0000 | ORAL_TABLET | Freq: Once | ORAL | Status: AC
  Administered 2014-04-02: 1 via ORAL

## 2014-04-02 MED ORDER — CALCIUM CARBONATE ANTACID 500 MG PO CHEW
1.0000 | CHEWABLE_TABLET | Freq: Three times a day (TID) | ORAL | Status: DC | PRN
  Administered 2014-04-02: 200 mg via ORAL
  Filled 2014-04-02 (×3): qty 1

## 2014-04-02 MED ORDER — CEPHALEXIN 500 MG PO CAPS
500.0000 mg | ORAL_CAPSULE | Freq: Three times a day (TID) | ORAL | Status: DC
  Filled 2014-04-02 (×4): qty 1

## 2014-04-02 MED ORDER — CLINDAMYCIN HCL 300 MG PO CAPS
450.0000 mg | ORAL_CAPSULE | Freq: Four times a day (QID) | ORAL | Status: DC
  Administered 2014-04-02: 450 mg via ORAL
  Filled 2014-04-02 (×4): qty 1

## 2014-04-02 MED ORDER — HYDROCODONE-ACETAMINOPHEN 5-325 MG PO TABS: 1.0000 | ORAL_TABLET | Freq: Four times a day (QID) | ORAL | Status: DC | PRN

## 2014-04-02 MED ORDER — CLINDAMYCIN HCL 150 MG PO CAPS: 450.0000 mg | ORAL_CAPSULE | Freq: Four times a day (QID) | ORAL | Status: DC

## 2014-04-02 MED ORDER — CEPHALEXIN 500 MG PO CAPS: 500.0000 mg | ORAL_CAPSULE | Freq: Two times a day (BID) | ORAL | Status: DC

## 2014-04-02 MED ORDER — DOXYCYCLINE HYCLATE 100 MG PO TABS
100.0000 mg | ORAL_TABLET | Freq: Two times a day (BID) | ORAL | Status: DC
  Administered 2014-04-02: 100 mg via ORAL
  Filled 2014-04-02 (×2): qty 1

## 2014-04-02 NOTE — Progress Notes (Signed)
1200 Pt reported that she vomited pain med given at 11:07 in the bathroom . Pain not visible ,asking for more pain med . Referred to MD No order this time . Will confer about it the patient informed

## 2014-04-02 NOTE — Discharge Summary (Signed)
[facility] Summary  Patient name: Shannon SlickerRashanda Randolph Medical record number: [id number] Date of birth: 08-02-1990 Age: 23 y.o. Gender: female Date of Admission: 03/31/2014  Date of Discharge: 04/02/14 Admitting Physician: Barbaraann BarthelJames O Breen, MD  Primary Care Provider: Kevin FentonBradshaw, Samuel, MD Consultants: None   Indication for Hospitalization: PID with tachyardia  Discharge Diagnoses/Problem List:  PID meeting sepsis criteria  UTI  Disposition: Home  Discharge Condition: Stable  Brief Hospital Course:  Shannon Randolph is a 23 y.o. female presenting with back pain and lower abdominal pain x 2 days, malodorous vaginal discharge, dysuria, and urinary frequency presenting found to be tachycardic to the 130s with a temperature of 100.6 while [facility].  A pelvic exam wrevealed cervical motion tenderness and "pus from the cervical os." A transvaginal U/S revealed a small amount of free pelvic fluid that could indicate a recent ovarian cyst rupture, without evidence of intrauterine or extrauterine pelvic or adnexal masses. She was given ceftriaxone and doxycycline. She received 1L NS bolus x 3 without improvement in her tachycardia and multiple doses of morphine 4mg  (x2) and dilaudid 1mg  (x4) without improvement in pain.   Meeting sepsis criteria and the clinical diagnosis of PID, she was [facility]. U/A was concerning for UTI therefore a urine culture was added on her [facility]. She was subsequently started on cefotan 2g IV q12hrs and doxycycline 100mg  PO q12hrs. She was given Zofran PRN nausea and IVFs.  GC/chlamydia, HIV, and RPR were negative. She was found to have >100,000 colonies of Ecoli on urine culture on the day of discharge. Given Rocephin and cefotan are great for Ecoli coverage generally, she was discharged home on a 7 day course of clindamycin for her pelvic infection.  On the day of  discharge she was able to tolerate fluids, her pain was improved, her leukocytosis had improved, and she was afebrile.   Issues for Follow Up:  -- F/u GU symptoms -- Given h/o recurrent BV infections, stress preventive measures  Significant Procedures: None  Significant Labs and Imaging:   Recent Labs Lab 03/31/14 1540 04/01/14 0518 04/02/14 0841  WBC 21.2* 14.9* 13.5*  HGB 13.9 11.7* 11.5*  HCT 40.3 35.8* 34.3*  PLT 286 218 210    Recent Labs Lab 03/31/14 1645 04/01/14 0518  NA 135*  --   K 4.0  --   CL 100  --   CO2 24  --   GLUCOSE 105*  --   BUN 6  --   CREATININE 0.69 0.63  CALCIUM 9.0  --   ALKPHOS 74  --   AST 11  --   ALT 11  --   ALBUMIN 3.7  --   Urine pregnancy: negative  Lipase 17  Lactic acid: 1.0 GC/chlamydia: negative  RPR: NR HIV: NR U/A: Negative for nitrite. Positive for LE, 21-50 WBCs, 2-6RBC, rare bacteria and squamous cells Wet prep: Few WBCs, no yeast, trich, or clue cells noted  Transvaginal U/S: Fairly small amount of free pelvic fluid. This finding could indicate recent ovarian cyst rupture. There is no intrauterine or extrauterine pelvic or adnexal mass.  Results/Tests Pending at Time of Discharge: None  Discharge Medications:    Medication List    STOP taking these medications        medroxyPROGESTERone 150 MG/ML injection  Commonly known as:  DEPO-PROVERA     metroNIDAZOLE 500 MG tablet  Commonly known as:  FLAGYL      TAKE these medications        clindamycin 150 MG capsule  Commonly known as:  CLEOCIN  Take 3 capsules (450 mg total) by mouth every 6 (six) hours.     ferrous sulfate 325 (65 FE) MG tablet  Take 1 tablet (325 mg total) by mouth daily with breakfast.     HYDROcodone-acetaminophen 5-325 MG per tablet  Commonly known as:  NORCO/VICODIN  Take 1 tablet by mouth every 6 (six) hours as needed for moderate pain.     ibuprofen 600 MG tablet  Commonly known as:  ADVIL,MOTRIN  Take 1 tablet (600 mg  total) by mouth every 6 (six) hours.     prenatal multivitamin Tabs tablet  Take 1 tablet by mouth daily.        Discharge Instructions: Please refer to Patient Instructions section of EMR for full details.  Patient was counseled important signs and symptoms that should prompt return to medical care, changes in medications, dietary instructions, activity restrictions, and follow up appointments.   Follow-Up Appointments: Follow-up Information    Follow up with Saralyn PilarKaramalegos, Alexander, DO On 04/08/2014.   Specialty:  Osteopathic Medicine   Why:  at 2:15pm for a hospital follow up   Contact information:   [address] [place] [address] [phone]       Joanna Puffrystal S Brookelynn Hamor, MD 04/02/2014, 12:02 PM PGY-1, [facility]

## 2014-04-02 NOTE — Clinical Documentation Improvement (Signed)
WBC 21.2,U/A cloudy with moderate leukocytes, tachycardic with heart rate in 120's, and temp of 100.6 on arrival.  Urine culture postivie for e. coli. Diagnosis of PID.  Please clarify any additional clinical conditions present, if any, related to the above findings and document in your progress note and discharge summary.  Possible Clinical Conditions: -Sepsis related to E. Coli UTI and PID -E.Coli UTI only -Other condition -Unable to determine at present  Thank you, Doy MinceVangela Kaylib Furness, RN [phone] Clinical Documentation Specialist

## 2014-04-02 NOTE — Progress Notes (Signed)
Family Medicine Teaching Service Daily Progress Note Intern Pager: [phone]  Patient name: Shannon SlickerRashanda Randolph Medical record number: [id number] Date of birth: 1990-10-15 Age: 23 y.o. Gender: female  Primary Care Provider: Kevin FentonBradshaw, Samuel, Randolph Consultants: None Code Status: Full per discussion on admission  Pt Overview and Major Events to Date:  11/5: admitted with concerns of PID. [facility]. Started Cefotan and doxy 11/6: Urine cx with >100,000 CFU of Ecoli, switched from cefotan to Keflex   Assessment and Plan: Shannon Randolph is a 23 y.o. female presenting with lumbar pain, lower abdominal pain, and vaginal discharge noted to have cervical motion tenderness and pus from the cervical os. PMH is significant for recurrent bacterial vaginosis and a h/o gonorrhea approximately 4 years ago.  #PID meeting sepsis criteria: Patient's symptoms and physical exam findings (pus from the cervical os and cervical motion tenderness) from [facility] are most consistent with PID. She had a low grade temperature at the OSH, was tachycardic to the 130s, and had a leukocytosis of 21.2. Other things on the DDx include a ruptured ovarian cyst (however I would expect this to be more unilateral pain), cystitis (U/A with moderate LE and WBC, but negative nitrite), or some gastrointestinal etiology (such as appendicitis). Urine pregnancy negative therefore much less likely an ectopic pregnancy. GC/chlamydia, HIV, and RPR negative  - Patient has been afebrile since 3pm on 11/4 - Will discontinue Cefotan and doxycycline (as gonorrhea negative), will start clindamycin 450mg  QID x 7 days  - If patient does not improve or worsens, consider CT abdomen/pelvis to rule out GI etiologies and nephrolithiasis given the pain is more unilateral  - Continue to monitor for complications of PID, such as Tresa GarterFitz Huge Curtis - Norco q4hrs PRN pain; discontinued Diluadid PRN breakthrough pain  - Zofran PRN nausea - Motrin  PRN fever   #UTI, complicated: Urine culture with >100,000 CFU of Ecoli, patient febrile on presentation, suprapubic pain, and dysuria. - [facility] as well as Cefotan here in the ED which should be sufficient for coverage  #Tachycardia: Most likely secondary to dehydration with improvement to 90-100s (which appears to be the patient's baseline)   FEN/GI: NS IV lock, regular diet, zofran PRN nausea  Prophylaxis: heparin, SCDs  Disposition: Pending improvement, today vs tomorrow   Subjective:  Patient states she's feeling somewhat better; still having some pain. Dysuria is more prominent today. Vaginal discharge/odor stable. States she has not been able to eat and becomes nauseated. I point out she's been taking PO vicodin and she tells me she tolerates this because she can take fluids without issues.  Spoke with her RN overnight who states he heated up spaghetti and corn for her last night and she did not complain of nausea after that. Later the RN calls me in the AM to tell me she states she only ate a few bites due to nausea.  Objective: Temp:  [98.3 F (36.8 C)-99 F (37.2 C)] 99 F (37.2 C) (11/06 0611) Pulse Rate:  [92-110] 110 (11/06 0611) Resp:  [16] 16 (11/06 0611) BP: (96-113)/(53-66) 108/63 mmHg (11/06 0611) SpO2:  [100 %] 100 % (11/06 0611) Weight:  [164 lb 4.8 oz (74.526 kg)] 164 lb 4.8 oz (74.526 kg) (11/06 84130611) Physical Exam: General: NAD, sleeping in bed comfortably Cardiovascular: Tachycardic. Regular rhythm. No m/r/g noted Respiratory: CTAB with no wheezing, rhonchi, or crackles Abdomen: +BS, soft, non-distended, tender to palpation over the left upper and lower quadrants and to a  lesser extent, periumbilically and epigastrium. No tenderness over the right side. +CVA tenderness on the left Extremities: No gross deformities  Laboratory:  Recent Labs Lab 03/31/14 1540 04/01/14 0518  WBC 21.2* 14.9*  HGB 13.9 11.7*  HCT 40.3 35.8*   PLT 286 218    Recent Labs Lab 03/31/14 1645 04/01/14 0518  NA 135*  --   K 4.0  --   CL 100  --   CO2 24  --   BUN 6  --   CREATININE 0.69 0.63  CALCIUM 9.0  --   PROT 7.2  --   BILITOT 0.4  --   ALKPHOS 74  --   ALT 11  --   AST 11  --   GLUCOSE 105*  --     Urine pregnancy: negative  Lipase 17  Lactic acid: 1.0 GC/chlamydia: negative  RPR: NR HIV: NR U/A: Negative for nitrite. Positive for LE, 21-50 WBCs, 2-6RBC, rare bacteria and squamous cells Wet prep: Few WBCs, no yeast, trich, or clue cells noted  Imaging/Diagnostic Tests: Transvaginal U/S: Fairly small amount of free pelvic fluid. This finding could indicate recent ovarian cyst rupture. There is no intrauterine or extrauterine pelvic or adnexal mass.  Shannon Puffrystal S Dorsey, Randolph 04/02/2014, 6:47 AM PGY-1, [place] [facility] FPTS Intern pager: [phone], text pages welcome

## 2014-04-02 NOTE — Plan of Care (Signed)
Problem: Phase I Progression Outcomes Goal: OOB as tolerated unless otherwise ordered Outcome: Completed/Met Date Met:  04/02/14 Goal: Initial discharge plan identified Outcome: Completed/Met Date Met:  04/02/14 Goal: Voiding-avoid urinary catheter unless indicated Outcome: Completed/Met Date Met:  04/02/14 Goal: Hemodynamically stable Outcome: Completed/Met Date Met:  04/02/14

## 2014-04-02 NOTE — Discharge Instructions (Signed)
Pelvic Inflammatory Disease °Pelvic inflammatory disease (PID) refers to an infection in some or all of the female organs. The infection can be in the uterus, ovaries, fallopian tubes, or the surrounding tissues in the pelvis. PID can cause abdominal or pelvic pain that comes on suddenly (acute pelvic pain). PID is a serious infection because it can lead to lasting (chronic) pelvic pain or the inability to have children (infertile).  °CAUSES  °The infection is often caused by the normal bacteria found in the vaginal tissues. PID may also be caused by an infection that is spread during sexual contact. PID can also occur following:  °· The birth of a baby.   °· A miscarriage.   °· An abortion.   °· Major pelvic surgery.   °· The use of an intrauterine device (IUD).   °· A sexual assault.   °RISK FACTORS °Certain factors can put a person at higher risk for PID, such as: °· Being younger than 25 years. °· Being sexually active at a young age. °· Using nonbarrier contraception. °· Having multiple sexual partners. °· Having sex with someone who has symptoms of a genital infection. °· Using oral contraception. °Other times, certain behaviors can increase the possibility of getting PID, such as: °· Having sex during your period. °· Using a vaginal douche. °· Having an intrauterine device (IUD) in place. °SYMPTOMS  °· Abdominal or pelvic pain.   °· Fever.   °· Chills.   °· Abnormal vaginal discharge. °· Abnormal uterine bleeding.   °· Unusual pain shortly after finishing your period. °DIAGNOSIS  °Your caregiver will choose some of the following methods to make a diagnosis, such as:  °· Performing a physical exam and history. A pelvic exam typically reveals a very tender uterus and surrounding pelvis.   °· Ordering laboratory tests including a pregnancy test, blood tests, and urine test.  °· Ordering cultures of the vagina and cervix to check for a sexually transmitted infection (STI). °· Performing an ultrasound.    °· Performing a laparoscopic procedure to look inside the pelvis.   °TREATMENT  °· Antibiotic medicines may be prescribed and taken by mouth.   °· Sexual partners may be treated when the infection is caused by a sexually transmitted disease (STD).   °· Hospitalization may be needed to give antibiotics intravenously. °· Surgery may be needed, but this is rare. °It may take weeks until you are completely well. If you are diagnosed with PID, you should also be checked for human immunodeficiency virus (HIV).   °HOME CARE INSTRUCTIONS  °· If given, take your antibiotics as directed. Finish the medicine even if you start to feel better.   °· Only take over-the-counter or prescription medicines for pain, discomfort, or fever as directed by your caregiver.   °· Do not have sexual intercourse until treatment is completed or as directed by your caregiver. If PID is confirmed, your recent sexual partner(s) will need treatment.   °· Keep your follow-up appointments. °SEEK MEDICAL CARE IF:  °· You have increased or abnormal vaginal discharge.   °· You need prescription medicine for your pain.   °· You vomit.   °· You cannot take your medicines.   °· Your partner has an STD.   °SEEK IMMEDIATE MEDICAL CARE IF:  °· You have a fever.   °· You have increased abdominal or pelvic pain.   °· You have chills.   °· You have pain when you urinate.   °· You are not better after 72 hours following treatment.   °MAKE SURE YOU:  °· Understand these instructions. °· Will watch your condition. °· Will get help right away if you are not doing well or get worse. °  Document Released: 05/14/2005 Document Revised: 09/08/2012 Document Reviewed: 05/10/2011 °ExitCare® Patient Information ©2015 [employer]. This information is not intended to replace advice given to you by your health care provider. Make sure you discuss any questions you have with your health care provider. ° °

## 2014-04-03 ENCOUNTER — Emergency Department (HOSPITAL_COMMUNITY): Payer: Medicaid Other

## 2014-04-03 ENCOUNTER — Encounter (HOSPITAL_COMMUNITY): Payer: Self-pay | Admitting: Emergency Medicine

## 2014-04-03 ENCOUNTER — Emergency Department (HOSPITAL_COMMUNITY)
Admission: EM | Admit: 2014-04-03 | Discharge: 2014-04-03 | Disposition: A | Discharge status: Home / Self Care | Payer: Medicaid Other | Attending: Emergency Medicine | Admitting: Emergency Medicine

## 2014-04-03 DIAGNOSIS — R110 Nausea: Secondary | ICD-10-CM | POA: Insufficient documentation

## 2014-04-03 DIAGNOSIS — A599 Trichomoniasis, unspecified: Secondary | ICD-10-CM | POA: Insufficient documentation

## 2014-04-03 DIAGNOSIS — R630 Anorexia: Secondary | ICD-10-CM | POA: Diagnosis not present

## 2014-04-03 DIAGNOSIS — Z3202 Encounter for pregnancy test, result negative: Secondary | ICD-10-CM | POA: Insufficient documentation

## 2014-04-03 DIAGNOSIS — R1032 Left lower quadrant pain: Secondary | ICD-10-CM | POA: Diagnosis present

## 2014-04-03 DIAGNOSIS — M549 Dorsalgia, unspecified: Secondary | ICD-10-CM

## 2014-04-03 LAB — CBC WITH DIFFERENTIAL/PLATELET
Basophils Absolute: 0 10*3/uL (ref 0.0–0.1)
Basophils Relative: 0 % (ref 0–1)
Eosinophils Absolute: 0.1 10*3/uL (ref 0.0–0.7)
Eosinophils Relative: 0 % (ref 0–5)
HEMATOCRIT: 35.2 % — AB (ref 36.0–46.0)
Hemoglobin: 11.9 g/dL — ABNORMAL LOW (ref 12.0–15.0)
LYMPHS PCT: 14 % (ref 12–46)
Lymphs Abs: 1.8 10*3/uL (ref 0.7–4.0)
MCH: 27.8 pg (ref 26.0–34.0)
MCHC: 33.8 g/dL (ref 30.0–36.0)
MCV: 82.2 fL (ref 78.0–100.0)
Monocytes Absolute: 1.7 10*3/uL — ABNORMAL HIGH (ref 0.1–1.0)
Monocytes Relative: 13 % — ABNORMAL HIGH (ref 3–12)
NEUTROS ABS: 9 10*3/uL — AB (ref 1.7–7.7)
NEUTROS PCT: 73 % (ref 43–77)
Platelets: 247 10*3/uL (ref 150–400)
RBC: 4.28 MIL/uL (ref 3.87–5.11)
RDW: 12.7 % (ref 11.5–15.5)
WBC: 12.5 10*3/uL — AB (ref 4.0–10.5)

## 2014-04-03 LAB — COMPREHENSIVE METABOLIC PANEL
ALT: 22 U/L (ref 0–35)
AST: 17 U/L (ref 0–37)
Albumin: 3.2 g/dL — ABNORMAL LOW (ref 3.5–5.2)
Alkaline Phosphatase: 87 U/L (ref 39–117)
Anion gap: 13 (ref 5–15)
BUN: 6 mg/dL (ref 6–23)
CO2: 26 meq/L (ref 19–32)
Calcium: 9.2 mg/dL (ref 8.4–10.5)
Chloride: 99 mEq/L (ref 96–112)
Creatinine, Ser: 0.67 mg/dL (ref 0.50–1.10)
GFR calc Af Amer: >90 mL/min (ref >90)
Glucose, Bld: 97 mg/dL (ref 70–99)
POTASSIUM: 4 meq/L (ref 3.7–5.3)
SODIUM: 138 meq/L (ref 137–147)
Total Bilirubin: 0.3 mg/dL (ref 0.3–1.2)
Total Protein: 6.9 g/dL (ref 6.0–8.3)

## 2014-04-03 LAB — URINALYSIS, ROUTINE W REFLEX MICROSCOPIC
Bilirubin Urine: NEGATIVE
Glucose, UA: NEGATIVE mg/dL
Ketones, ur: NEGATIVE mg/dL
NITRITE: NEGATIVE
Protein, ur: NEGATIVE mg/dL
SPECIFIC GRAVITY, URINE: 1.01 (ref 1.005–1.030)
UROBILINOGEN UA: 1 mg/dL (ref 0.0–1.0)
pH: 7 (ref 5.0–8.0)

## 2014-04-03 LAB — URINE MICROSCOPIC-ADD ON

## 2014-04-03 LAB — POC URINE PREG, ED: PREG TEST UR: NEGATIVE

## 2014-04-03 MED ORDER — DOXYCYCLINE HYCLATE 100 MG PO CAPS: 100.0000 mg | ORAL_CAPSULE | Freq: Two times a day (BID) | ORAL | Status: DC

## 2014-04-03 MED ORDER — SODIUM CHLORIDE 0.9 % IV BOLUS (SEPSIS)
1000.0000 mL | Freq: Once | INTRAVENOUS | Status: AC
  Administered 2014-04-03: 1000 mL via INTRAVENOUS

## 2014-04-03 MED ORDER — HYDROMORPHONE HCL 1 MG/ML IJ SOLN
0.5000 mg | Freq: Once | INTRAMUSCULAR | Status: AC
  Administered 2014-04-03: 0.5 mg via INTRAVENOUS
  Filled 2014-04-03: qty 1

## 2014-04-03 MED ORDER — CEPHALEXIN 500 MG PO CAPS: 500.0000 mg | ORAL_CAPSULE | Freq: Four times a day (QID) | ORAL | Status: DC

## 2014-04-03 MED ORDER — METRONIDAZOLE 500 MG PO TABS: 500.0000 mg | ORAL_TABLET | Freq: Two times a day (BID) | ORAL | Status: DC

## 2014-04-03 MED ORDER — METRONIDAZOLE 500 MG PO TABS
2000.0000 mg | ORAL_TABLET | Freq: Once | ORAL | Status: AC
  Administered 2014-04-03: 2000 mg via ORAL
  Filled 2014-04-03: qty 4

## 2014-04-03 NOTE — ED Notes (Signed)
Discharged from hospital yesterday for PID; given scripts for antibiotics and pain medication which pharmacy would not fill because DEA # on scripts is not valid. Returning to hospital because still having abdominal pain and cannot get her medication.

## 2014-04-03 NOTE — ED Notes (Addendum)
PT comfortable with discharge and follow up instructions. Prescriptions x2. PT declines wheelchair, escorted to waiting area.

## 2014-04-03 NOTE — ED Notes (Signed)
Patient transported to Ultrasound 

## 2014-04-03 NOTE — Discharge Instructions (Signed)
Abdominal Pain, Women °Abdominal (stomach, pelvic, or belly) pain can be caused by many things. It is important to tell your doctor: °· The location of the pain. °· Does it come and go or is it present all the time? °· Are there things that start the pain (eating certain foods, exercise)? °· Are there other symptoms associated with the pain (fever, nausea, vomiting, diarrhea)? °All of this is helpful to know when trying to find the cause of the pain. °CAUSES  °· Stomach: virus or bacteria infection, or ulcer. °· Intestine: appendicitis (inflamed appendix), regional ileitis (Crohn's disease), ulcerative colitis (inflamed colon), irritable bowel syndrome, diverticulitis (inflamed diverticulum of the colon), or cancer of the stomach or intestine. °· Gallbladder disease or stones in the gallbladder. °· Kidney disease, kidney stones, or infection. °· Pancreas infection or cancer. °· Fibromyalgia (pain disorder). °· Diseases of the female organs: °· Uterus: fibroid (non-cancerous) tumors or infection. °· Fallopian tubes: infection or tubal pregnancy. °· Ovary: cysts or tumors. °· Pelvic adhesions (scar tissue). °· Endometriosis (uterus lining tissue growing in the pelvis and on the pelvic organs). °· Pelvic congestion syndrome (female organs filling up with blood just before the menstrual period). °· Pain with the menstrual period. °· Pain with ovulation (producing an egg). °· Pain with an IUD (intrauterine device, birth control) in the uterus. °· Cancer of the female organs. °· Functional pain (pain not caused by a disease, may improve without treatment). °· Psychological pain. °· Depression. °DIAGNOSIS  °Your doctor will decide the seriousness of your pain by doing an examination. °· Blood tests. °· X-rays. °· Ultrasound. °· CT scan (computed tomography, special type of X-ray). °· MRI (magnetic resonance imaging). °· Cultures, for infection. °· Barium enema (dye inserted in the large intestine, to better view it with  X-rays). °· Colonoscopy (looking in intestine with a lighted tube). °· Laparoscopy (minor surgery, looking in abdomen with a lighted tube). °· Major abdominal exploratory surgery (looking in abdomen with a large incision). °TREATMENT  °The treatment will depend on the cause of the pain.  °· Many cases can be observed and treated at home. °· Over-the-counter medicines recommended by your caregiver. °· Prescription medicine. °· Antibiotics, for infection. °· Birth control pills, for painful periods or for ovulation pain. °· Hormone treatment, for endometriosis. °· Nerve blocking injections. °· Physical therapy. °· Antidepressants. °· Counseling with a psychologist or psychiatrist. °· Minor or major surgery. °HOME CARE INSTRUCTIONS  °· Do not take laxatives, unless directed by your caregiver. °· Take over-the-counter pain medicine only if ordered by your caregiver. Do not take aspirin because it can cause an upset stomach or bleeding. °· Try a clear liquid diet (broth or water) as ordered by your caregiver. Slowly move to a bland diet, as tolerated, if the pain is related to the stomach or intestine. °· Have a thermometer and take your temperature several times a day, and record it. °· Bed rest and sleep, if it helps the pain. °· Avoid sexual intercourse, if it causes pain. °· Avoid stressful situations. °· Keep your follow-up appointments and tests, as your caregiver orders. °· If the pain does not go away with medicine or surgery, you may try: °· Acupuncture. °· Relaxation exercises (yoga, meditation). °· Group therapy. °· Counseling. °SEEK MEDICAL CARE IF:  °· You notice certain foods cause stomach pain. °· Your home care treatment is not helping your pain. °· You need stronger pain medicine. °· You want your IUD removed. °· You feel faint or   lightheaded.  You develop nausea and vomiting.  You develop a rash.  You are having side effects or an allergy to your medicine. SEEK IMMEDIATE MEDICAL CARE IF:   Your  pain does not go away or gets worse.  You have a fever.  Your pain is felt only in portions of the abdomen. The right side could possibly be appendicitis. The left lower portion of the abdomen could be colitis or diverticulitis.  You are passing blood in your stools (bright red or black tarry stools, with or without vomiting).  You have blood in your urine.  You develop chills, with or without a fever.  You pass out. MAKE SURE YOU:   Understand these instructions.  Will watch your condition.  Will get help right away if you are not doing well or get worse. Document Released: 03/11/2007 Document Revised: 09/28/2013 Document Reviewed: 03/31/2009 [facility] Patient Information 2015 [place]ExitCare, [place]. This information is not intended to replace advice given to you by your health care provider. Make sure you discuss any questions you have with your health care provider. Trichomoniasis Trichomoniasis is an infection caused by an organism called Trichomonas. The infection can affect both women and men. In women, the outer female genitalia and the vagina are affected. In men, the penis is mainly affected, but the prostate and other reproductive organs can also be involved. Trichomoniasis is a sexually transmitted infection (STI) and is most often passed to another person through sexual contact.  RISK FACTORS  Having unprotected sexual intercourse.  Having sexual intercourse with an infected partner. SIGNS AND SYMPTOMS  Symptoms of trichomoniasis in women include:  Abnormal gray-green frothy vaginal discharge.  Itching and irritation of the vagina.  Itching and irritation of the area outside the vagina. Symptoms of trichomoniasis in men include:   Penile discharge with or without pain.  Pain during urination. This results from inflammation of the urethra. DIAGNOSIS  Trichomoniasis may be found during a Pap test or physical exam. Your health care provider may use one of the following  methods to help diagnose this infection:  Examining vaginal discharge under a microscope. For men, urethral discharge would be examined.  Testing the pH of the vagina with a test tape.  Using a vaginal swab test that checks for the Trichomonas organism. A test is available that provides results within a few minutes.  Doing a culture test for the organism. This is not usually needed. TREATMENT   You may be given medicine to fight the infection. Women should inform their health care provider if they could be or are pregnant. Some medicines used to treat the infection should not be taken during pregnancy.  Your health care provider may recommend over-the-counter medicines or creams to decrease itching or irritation.  Your sexual partner will need to be treated if infected. HOME CARE INSTRUCTIONS   Take medicines only as directed by your health care provider.  Take over-the-counter medicine for itching or irritation as directed by your health care provider.  Do not have sexual intercourse while you have the infection.  Women should not douche or wear tampons while they have the infection.  Discuss your infection with your partner. Your partner may have gotten the infection from you, or you may have gotten it from your partner.  Have your sex partner get examined and treated if necessary.  Practice safe, informed, and protected sex.  See your health care provider for other STI testing. SEEK MEDICAL CARE IF:   You still have symptoms  after you finish your medicine.  You develop abdominal pain.  You have pain when you urinate.  You have bleeding after sexual intercourse.  You develop a rash.  Your medicine makes you sick or makes you throw up (vomit). MAKE SURE YOU:  Understand these instructions.  Will watch your condition.  Will get help right away if you are not doing well or get worse. Document Released: 11/07/2000 Document Revised: 09/28/2013 Document Reviewed:  02/23/2013 [facility] Patient Information 2015 [place]ExitCare, [place]LL[place]. This information is not intended to replace advice given to you by your health care provider. Make sure you discuss any questions you have with your health care provider. Urinary Tract Infection Urinary tract infections (UTIs) can develop anywhere along your urinary tract. Your urinary tract is your body's drainage system for removing wastes and extra water. Your urinary tract includes two kidneys, two ureters, a bladder, and a urethra. Your kidneys are a pair of bean-shaped organs. Each kidney is about the size of your fist. They are located below your ribs, one on each side of your spine. CAUSES Infections are caused by microbes, which are microscopic organisms, including fungi, viruses, and bacteria. These organisms are so small that they can only be seen through a microscope. Bacteria are the microbes that most commonly cause UTIs. SYMPTOMS  Symptoms of UTIs may vary by age and gender of the patient and by the location of the infection. Symptoms in young women typically include a frequent and intense urge to urinate and a painful, burning feeling in the bladder or urethra during urination. Older women and men are more likely to be tired, shaky, and weak and have muscle aches and abdominal pain. A fever may mean the infection is in your kidneys. Other symptoms of a kidney infection include pain in your back or sides below the ribs, nausea, and vomiting. DIAGNOSIS To diagnose a UTI, your caregiver will ask you about your symptoms. Your caregiver also will ask to provide a urine sample. The urine sample will be tested for bacteria and white blood cells. White blood cells are made by your body to help fight infection. TREATMENT  Typically, UTIs can be treated with medication. Because most UTIs are caused by a bacterial infection, they usually can be treated with the use of antibiotics. The choice of antibiotic and length of treatment depend on  your symptoms and the type of bacteria causing your infection. HOME CARE INSTRUCTIONS  If you were prescribed antibiotics, take them exactly as your caregiver instructs you. Finish the medication even if you feel better after you have only taken some of the medication.  Drink enough water and fluids to keep your urine clear or pale yellow.  Avoid caffeine, tea, and carbonated beverages. They tend to irritate your bladder.  Empty your bladder often. Avoid holding urine for long periods of time.  Empty your bladder before and after sexual intercourse.  After a bowel movement, women should cleanse from front to back. Use each tissue only once. SEEK MEDICAL CARE IF:   You have back pain.  You develop a fever.  Your symptoms do not begin to resolve within 3 days. SEEK IMMEDIATE MEDICAL CARE IF:   You have severe back pain or lower abdominal pain.  You develop chills.  You have nausea or vomiting.  You have continued burning or discomfort with urination. MAKE SURE YOU:   Understand these instructions.  Will watch your condition.  Will get help right away if you are not doing well or  get worse. Document Released: 02/21/2005 Document Revised: 11/13/2011 Document Reviewed: 06/22/2011 [facility] Patient Information 2015 [place], [place]. This information is not intended to replace advice given to you by your health care provider. Make sure you discuss any questions you have with your health care provider.

## 2014-04-03 NOTE — ED Notes (Signed)
Pt back from ultrasound.

## 2014-04-03 NOTE — ED Notes (Signed)
Informed pt that we needed a urine sample, she stated that she didn't have to go at this time.

## 2014-04-03 NOTE — ED Provider Notes (Signed)
CSN: [id number]     Arrival date & time 04/03/14  4098 History   First MD Initiated Contact with Patient 04/03/14 [phone]     Chief Complaint  Patient presents with  . Medication Refill  . Abdominal Pain     (Consider location/radiation/quality/duration/timing/severity/associated sxs/prior Treatment) Patient is a 23 y.o. female presenting with abdominal pain.  Abdominal Pain Pain location:  LLQ Pain quality: aching   Pain radiates to:  Does not radiate Pain severity:  Moderate Onset quality:  Gradual Duration:  5 days Timing:  Constant Progression:  Worsening Chronicity:  New Context: recent illness (recently admitted for presumed PID, then thought to be potentially pyelo)   Relieved by:  Nothing Worsened by:  Palpation Ineffective treatments:  None tried Associated symptoms: anorexia and nausea   Associated symptoms: no vomiting     Past Medical History  Diagnosis Date  . No pertinent past medical history   . Infection     UTI, gonorrhea  . Bacterial vaginosis    Past Surgical History  Procedure Laterality Date  . No past surgeries     Family History  Problem Relation Age of Onset  . Hearing loss Neg Hx    History  Substance Use Topics  . Smoking status: Former Smoker    Quit date: 01/11/2012  . Smokeless tobacco: Former Neurosurgeon    Quit date: 01/11/2012  . Alcohol Use: No   OB History    Gravida Para Term Preterm AB TAB SAB Ectopic Multiple Living   2 2 2       2      Review of Systems  Gastrointestinal: Positive for nausea, abdominal pain and anorexia. Negative for vomiting.  All other systems reviewed and are negative.     Allergies  Review of patient's allergies indicates no known allergies.  Home Medications   Prior to Admission medications   Medication Sig Start Date End Date Taking? Authorizing Provider  cephALEXin (KEFLEX) 500 MG capsule Take 1 capsule (500 mg total) by mouth 4 (four) times daily. 04/03/14   Mirian Mo, MD  doxycycline  (VIBRAMYCIN) 100 MG capsule Take 1 capsule (100 mg total) by mouth 2 (two) times daily. 04/03/14   Mirian Mo, MD  HYDROcodone-acetaminophen (NORCO/VICODIN) 5-325 MG per tablet Take 1 tablet by mouth every 6 (six) hours as needed for moderate pain. 04/02/14   Joanna Puff, MD   BP 110/72 mmHg  Pulse 86  Temp(Src) 99 F (37.2 C) (Oral)  Resp 13  Ht 5\' 3"  (1.6 m)  Wt 164 lb (74.39 kg)  BMI 29.06 kg/m2  SpO2 100%  LMP 03/18/2014 (Exact Date) Physical Exam  Constitutional: She is oriented to person, place, and time. She appears well-developed and well-nourished.  HENT:  Head: Normocephalic and atraumatic.  Right Ear: External ear normal.  Left Ear: External ear normal.  Eyes: Conjunctivae and EOM are normal. Pupils are equal, round, and reactive to light.  Neck: Normal range of motion. Neck supple.  Cardiovascular: Normal rate, regular rhythm, normal heart sounds and intact distal pulses.   Pulmonary/Chest: Effort normal and breath sounds normal.  Abdominal: Soft. Bowel sounds are normal. There is tenderness in the left lower quadrant. There is CVA tenderness (L).  Musculoskeletal: Normal range of motion.  Neurological: She is alert and oriented to person, place, and time.  Skin: Skin is warm and dry.  Vitals reviewed.   ED Course  Procedures (including critical care time) Labs Review Labs Reviewed  URINALYSIS, ROUTINE W REFLEX MICROSCOPIC -  Abnormal; Notable for the following:    APPearance CLOUDY (*)    Hgb urine dipstick SMALL (*)    Leukocytes, UA LARGE (*)    All other components within normal limits  CBC WITH DIFFERENTIAL - Abnormal; Notable for the following:    WBC 12.5 (*)    Hemoglobin 11.9 (*)    HCT 35.2 (*)    Neutro Abs 9.0 (*)    Monocytes Relative 13 (*)    Monocytes Absolute 1.7 (*)    All other components within normal limits  COMPREHENSIVE METABOLIC PANEL - Abnormal; Notable for the following:    Albumin 3.2 (*)    All other components within  normal limits  URINE MICROSCOPIC-ADD ON - Abnormal; Notable for the following:    Squamous Epithelial / LPF FEW (*)    All other components within normal limits  POC URINE PREG, ED    Imaging Review [place] Transvaginal Non-ob  04/03/2014   CLINICAL DATA:  Left lower quadrant pelvic pain  EXAM: TRANSABDOMINAL AND TRANSVAGINAL ULTRASOUND OF PELVIS  TECHNIQUE: Both transabdominal and transvaginal ultrasound examinations of the pelvis were performed. Transabdominal technique was performed for global imaging of the pelvis including uterus, ovaries, adnexal regions, and pelvic cul-de-sac. It was necessary to proceed with endovaginal exam following the transabdominal exam to visualize the endometrium and ovaries.  COMPARISON:  None  FINDINGS: Uterus  Measurements: 7.3 x 4.7 x 3.8 cm. Anteverted, anteflexed. No fibroids or other mass visualized.  Endometrium  Thickness: 7 mm, borderline trilaminar in appearance without focal abnormality.  Right ovary  Measurements: 3.2 x 2.7 x 2.3 cm. Normal appearance/no adnexal mass.  Left ovary  Measurements: 3.0 x 2.5 x 1.9 cm. Normal appearance/no adnexal mass.  Other findings  Small free fluid in the cul de sac  IMPRESSION: Normal exam.   Electronically Signed   By: Christiana PellantGretchen  Green M.D.   On: 04/03/2014 12:46   [place] Pelvis Complete  04/03/2014   CLINICAL DATA:  Left lower quadrant pelvic pain  EXAM: TRANSABDOMINAL AND TRANSVAGINAL ULTRASOUND OF PELVIS  TECHNIQUE: Both transabdominal and transvaginal ultrasound examinations of the pelvis were performed. Transabdominal technique was performed for global imaging of the pelvis including uterus, ovaries, adnexal regions, and pelvic cul-de-sac. It was necessary to proceed with endovaginal exam following the transabdominal exam to visualize the endometrium and ovaries.  COMPARISON:  None  FINDINGS: Uterus  Measurements: 7.3 x 4.7 x 3.8 cm. Anteverted, anteflexed. No fibroids or other mass visualized.  Endometrium  Thickness: 7 mm,  borderline trilaminar in appearance without focal abnormality.  Right ovary  Measurements: 3.2 x 2.7 x 2.3 cm. Normal appearance/no adnexal mass.  Left ovary  Measurements: 3.0 x 2.5 x 1.9 cm. Normal appearance/no adnexal mass.  Other findings  Small free fluid in the cul de sac  IMPRESSION: Normal exam.   Electronically Signed   By: Christiana PellantGretchen  Green M.D.   On: 04/03/2014 12:46   [place] Renal  04/03/2014   CLINICAL DATA:  CVA tenderness.  Left lower quadrant pain.  EXAM: RENAL/URINARY TRACT ULTRASOUND COMPLETE  COMPARISON:  None.  FINDINGS: Right Kidney:  Length: 11 cm. Echogenicity within normal limits. No mass or hydronephrosis visualized. No evidence of abscess.  Left Kidney:  Length: 11 cm. Echogenicity within normal limits. No mass or hydronephrosis visualized. No evidence of abscess.  Bladder:  Appears normal for degree of bladder distention.  IMPRESSION: Normal renal ultrasound.   Electronically Signed   By: Tiburcio PeaJonathan  Watts M.D.   On: 04/03/2014 13:15  EKG Interpretation None      MDM   Final diagnoses:  LLQ pain  CVA tenderness  Trichomoniasis    23 y.o. female with pertinent PMH of recent admission for initially presumed PID, followed by negative GC and transition to clindamycin presents with continued and worsening symptoms, and was unable to have her abx filled due [facility]  Pt febrile to 100.7 last night.  On arrival today, pt tearful, tachycardic to 105-115, and with continued abd tenderness.  Obtained labs,ua, and imaging.  US demonstrated no TOA or hydronephrosis to show concern for nephrolithiasis.   This was remarkable only for trichomoniasis, large leukocytes.  Will treat with keflex and doxy.  Given flagyl PO here.  Symptoms controlled and improved prior to dc.  Differential includes PID vs pyelonephritis, however at this time pt is taking PO without difficulty, is afebrile, and had normalization of vitals with pain control.  DC home in  stable condition.  1. Trichomoniasis   2. LLQ pain   3. CVA tenderness         Mirian MoMatthew Gentry, MD 04/03/14 1410

## 2014-04-05 ENCOUNTER — Telehealth: Payer: Self-pay | Admitting: Family Medicine

## 2014-04-05 LAB — URINE CULTURE: Colony Count: >=100000

## 2014-04-05 MED ORDER — CIPROFLOXACIN HCL 500 MG PO TABS: 500.0000 mg | ORAL_TABLET | Freq: Two times a day (BID) | ORAL | Status: DC

## 2014-04-05 NOTE — Telephone Encounter (Signed)
Called to discuss culture results with patient. She has been back to  The ED since DC.   She was treated with 1 G rocephin and 2 additional days of cefotan, a 2nd gen cephalosporoin. Given her resistance pattern I think its reasonable to treat her E coli UTI with something specifically for her UTI.   She states  He has abd pain and dysuria. She was given doxy on top of the clinda we sent her out on. I told her to stop clinda, continue the doxy, and start the new med I sent - cipro.   She states he does not have keflex, it would be resistant to this.   She understands and has f/u scheduled.   Murtis SinkSam Bradshaw, MD [facility] Resident, PGY-3 04/05/2014, 8:36 AM

## 2014-04-08 ENCOUNTER — Encounter: Payer: Self-pay | Admitting: Family Medicine

## 2014-04-08 ENCOUNTER — Ambulatory Visit (INDEPENDENT_AMBULATORY_CARE_PROVIDER_SITE_OTHER): Payer: Medicaid Other | Admitting: Family Medicine

## 2014-04-08 VITALS — BP 108/65 | HR 104 | Temp 98.6°F | Ht 63.0 in | Wt 161.0 lb

## 2014-04-08 DIAGNOSIS — N730 Acute parametritis and pelvic cellulitis: Secondary | ICD-10-CM

## 2014-04-08 DIAGNOSIS — N390 Urinary tract infection, site not specified: Secondary | ICD-10-CM

## 2014-04-08 MED ORDER — HYDROCODONE-ACETAMINOPHEN 5-325 MG PO TABS: 1.0000 | ORAL_TABLET | Freq: Four times a day (QID) | ORAL | Status: DC | PRN

## 2014-04-08 NOTE — Assessment & Plan Note (Addendum)
Unclear etiology of initial pelvic infection (+small amt pelvic free fluid) with + SIRS criteria, suspect may have been related to complicated UTI. - Vitals improved, afebrile, some tachycardia, clinically not dehydrated, tolerating PO abx. Resolved symptoms  Plan: 1. Complete recently rx'd antibiotic course: Doxycycline 100mg  BID x 10 days and Cipro 500mg  BID x 7 days (discontinued Clinda from discharge) 2. RTC 2 weeks if no improvement or worsening

## 2014-04-08 NOTE — Patient Instructions (Signed)
Dear Shannon Randolph, Thank you for coming in to clinic today.  1. Please take the following antibiotics - FINISH ALL medicine, even if you feel better.  - Ciprofloxacin 500mg  - take 1 tablet twice daily with food - (you should have total 14 tables)  - Doxycycline 100mg  - take 1 capsule twice daily with food and plenty of water - (you should have gotten 20 capsules - total for 10 days)  Please schedule a follow-up appointment with Dr. Ermalinda MemosBradshaw as needed within 2 weeks if symptoms do not improve or sooner if worsen.  If you have any other questions or concerns, please feel free to call the clinic to contact me. You may also schedule an earlier appointment if necessary.  However, if your symptoms get significantly worse, please go to the Emergency Department to seek immediate medical attention.  Saralyn PilarAlexander Karamalegos, DO [facility]   Pelvic Inflammatory Disease Pelvic inflammatory disease (PID) refers to an infection in some or all of the female organs. The infection can be in the uterus, ovaries, fallopian tubes, or the surrounding tissues in the pelvis. PID can cause abdominal or pelvic pain that comes on suddenly (acute pelvic pain). PID is a serious infection because it can lead to lasting (chronic) pelvic pain or the inability to have children (infertile).  CAUSES  The infection is often caused by the normal bacteria found in the vaginal tissues. PID may also be caused by an infection that is spread during sexual contact. PID can also occur following:   The birth of a baby.   A miscarriage.   An abortion.   Major pelvic surgery.   The use of an intrauterine device (IUD).   A sexual assault.  RISK FACTORS Certain factors can put a person at higher risk for PID, such as:  Being younger than 25 years.  Being sexually active at [place] age.  Usingnonbarrier contraception.  Havingmultiple sexual partners.  Having sex with someone who has symptoms of a  genital infection.  Using oral contraception. Other times, certain behaviors can increase the possibility of getting PID, such as:  Having sex during your period.  Using a vaginal douche.  Having an intrauterine device (IUD) in place. SYMPTOMS   Abdominal or pelvic pain.   Fever.   Chills.   Abnormal vaginal discharge.  Abnormal uterine bleeding.   Unusual pain shortly after finishing your period. DIAGNOSIS  Your caregiver will choose some of the following methods to make a diagnosis, such as:   Performinga physical exam and history. A pelvic exam typically reveals a very tender uterus and surrounding pelvis.   Ordering laboratory tests including a pregnancy test, blood tests, and urine test.  Orderingcultures of the vagina and cervix to check for a sexually transmitted infection (STI).  Performing an ultrasound.   Performing a laparoscopic procedure to look inside the pelvis.  TREATMENT   Antibiotic medicines may be prescribed and taken by mouth.   Sexual partners may be treated when the infection is caused by a sexually transmitted disease (STD).   Hospitalization may be needed to give antibiotics intravenously.  Surgery may be needed, but this is rare. It may take weeks until you are completely well. If you are diagnosed with PID, you should also be checked for human immunodeficiency virus (HIV). HOME CARE INSTRUCTIONS   If given, take your antibiotics as directed. Finish the medicine even if you start to feel better.   Only take over-the-counter or prescription medicines for pain, discomfort, or fever  as directed by your caregiver.   Do not have sexual intercourse until treatment is completed or as directed by your caregiver. If PID is confirmed, your recent sexual partner(s) will need treatment.   Keep your follow-up appointments. SEEK MEDICAL CARE IF:   You have increased or abnormal vaginal discharge.   You need prescription medicine  for your pain.   You vomit.   You cannot take your medicines.   Your partner has an STD.  SEEK IMMEDIATE MEDICAL CARE IF:   You have a fever.   You have increased abdominal or pelvic pain.   You have chills.   You have pain when you urinate.   You are not better after 72 hours following treatment.  MAKE SURE YOU:   Understand these instructions.  Will watch your condition.  Will get help right away if you are not doing well or get worse. Document Released: 05/14/2005 Document Revised: 09/08/2012 Document Reviewed: 05/10/2011 [facility] Patient Information 2015 [employer] [place] [place]. This information is not intended to replace advice given to you by your health care provider. Make sure you discuss any questions you have with your health care provider.

## 2014-04-08 NOTE — Assessment & Plan Note (Signed)
Due to mostly pan-sensitive E.Coli, urine culture from 03/31/14 - Vitals improved, afebrile, some tachycardia, clinically not dehydrated, tolerating PO abx. Resolved symptoms  Plan: 1. Complete recently rx'd antibiotic course: Cipro 500mg  BID x 7 days (discontinued Keflex, per recent phone note) 2. RTC 2 weeks if no improvement or worsening

## 2014-04-08 NOTE — Progress Notes (Signed)
   Subjective:    Patient ID: Shannon Randolph, female    DOB: June 02, 1990, 23 y.o.   MRN: [id number]  HPI  Hospital Follow-up: - Admitted on 03/31/14 for PID and Complicated UTI, Discharged on 04/02/14  PID / Complicated UTI: - Presented for hospitalization with abdominal and back pain, vaginal discharge, dysuria, and urinary frequency. Treated with IVF, analgesia, and antibiotics. Discharged on Doxycycline and Clindamycin. - Recent GC/Chlamydia (negative) - Recent ED visit 11/7 - tachycardia, low-grade fever, Pelvic US (negative TOA, hydro), wet prep with trichomonas. Treated with Flagyl x 1, and rx Keflex + Doxycycline - Currently doing much better, some residual Left lower back pain. Occasionally nausea due to medications. Tolerating PO well - Denies fevers/chills, abdominal pain, nausea / vomiting, diarrhea, dysuria, hematuria, vaginal discharge  I have reviewed and updated the following as appropriate: allergies and current medications  Social Hx: - Former smoker  Review of Systems  See above HPI    Objective:   Physical Exam  BP 108/65 mmHg  Pulse 104  Temp(Src) 98.6 F (37 C) (Oral)  Ht 5\' 3"  (1.6 m)  Wt 161 lb (73.029 kg)  BMI 28.53 kg/m2  LMP 04/06/2014  Gen - well-appearing, NAD HEENT - MMM Heart - Slightly improved tachycardia on exam, no murmurs heard Lungs - CTAB. Normal work of breathing. Abd - soft, NTND, no masses, +active BS MSK - no CVAT or back tenderness Ext - non-tender, no edema, peripheral pulses intact +2 b/l Skin - warm, dry, no rashes Neuro - awake, alert, oriented     Assessment & Plan:   See specific A&P problem list for details.

## 2014-05-06 ENCOUNTER — Inpatient Hospital Stay (HOSPITAL_COMMUNITY)
Admission: AD | Admit: 2014-05-06 | Discharge: 2014-05-06 | Disposition: A | Discharge status: Home / Self Care | Payer: Medicaid Other | Source: Ambulatory Visit | Attending: Obstetrics & Gynecology | Admitting: Obstetrics & Gynecology

## 2014-05-06 ENCOUNTER — Encounter (HOSPITAL_COMMUNITY): Payer: Self-pay | Admitting: Medical

## 2014-05-06 DIAGNOSIS — R109 Unspecified abdominal pain: Secondary | ICD-10-CM | POA: Diagnosis present

## 2014-05-06 DIAGNOSIS — O98311 Other infections with a predominantly sexual mode of transmission complicating pregnancy, first trimester: Secondary | ICD-10-CM | POA: Insufficient documentation

## 2014-05-06 DIAGNOSIS — A5901 Trichomonal vulvovaginitis: Secondary | ICD-10-CM | POA: Diagnosis not present

## 2014-05-06 DIAGNOSIS — O26899 Other specified pregnancy related conditions, unspecified trimester: Secondary | ICD-10-CM

## 2014-05-06 DIAGNOSIS — Z3A01 Less than 8 weeks gestation of pregnancy: Secondary | ICD-10-CM | POA: Diagnosis not present

## 2014-05-06 DIAGNOSIS — O9989 Other specified diseases and conditions complicating pregnancy, childbirth and the puerperium: Secondary | ICD-10-CM

## 2014-05-06 DIAGNOSIS — Z87891 Personal history of nicotine dependence: Secondary | ICD-10-CM | POA: Diagnosis not present

## 2014-05-06 LAB — URINALYSIS, ROUTINE W REFLEX MICROSCOPIC
Bilirubin Urine: NEGATIVE
Glucose, UA: NEGATIVE mg/dL
Hgb urine dipstick: NEGATIVE
KETONES UR: NEGATIVE mg/dL
Nitrite: NEGATIVE
Protein, ur: NEGATIVE mg/dL
Specific Gravity, Urine: 1.025 (ref 1.005–1.030)
UROBILINOGEN UA: 0.2 mg/dL (ref 0.0–1.0)
pH: 6 (ref 5.0–8.0)

## 2014-05-06 LAB — HIV ANTIBODY (ROUTINE TESTING W REFLEX): HIV: NONREACTIVE

## 2014-05-06 LAB — URINE MICROSCOPIC-ADD ON

## 2014-05-06 LAB — HCG, QUANTITATIVE, PREGNANCY: hCG, Beta Chain, Quant, S: 957 m[IU]/mL — ABNORMAL HIGH (ref <5)

## 2014-05-06 MED ORDER — ONDANSETRON HCL 4 MG/2ML IJ SOLN
4.0000 mg | Freq: Once | INTRAMUSCULAR | Status: AC
  Administered 2014-05-06: 4 mg via INTRAVENOUS
  Filled 2014-05-06: qty 2

## 2014-05-06 MED ORDER — OXYCODONE-ACETAMINOPHEN 5-325 MG PO TABS
1.0000 | ORAL_TABLET | Freq: Once | ORAL | Status: AC
  Administered 2014-05-06: 1 via ORAL
  Filled 2014-05-06: qty 1

## 2014-05-06 MED ORDER — CEFTRIAXONE SODIUM 250 MG IJ SOLR
250.0000 mg | Freq: Once | INTRAMUSCULAR | Status: AC
  Administered 2014-05-06: 250 mg via INTRAMUSCULAR
  Filled 2014-05-06: qty 250

## 2014-05-06 MED ORDER — METRONIDAZOLE IN NACL 5-0.79 MG/ML-% IV SOLN
500.0000 mg | Freq: Once | INTRAVENOUS | Status: AC
  Administered 2014-05-06: 500 mg via INTRAVENOUS
  Filled 2014-05-06: qty 100

## 2014-05-06 MED ORDER — PROMETHAZINE HCL 25 MG/ML IJ SOLN
12.5000 mg | Freq: Four times a day (QID) | INTRAMUSCULAR | Status: DC | PRN
  Administered 2014-05-06: 12.5 mg via INTRAVENOUS
  Filled 2014-05-06 (×2): qty 1

## 2014-05-06 MED ORDER — METRONIDAZOLE 500 MG PO TABS: 1000.0000 mg | ORAL_TABLET | Freq: Once | ORAL | Status: DC

## 2014-05-06 MED ORDER — TRAMADOL HCL 50 MG PO TABS: 50.0000 mg | ORAL_TABLET | Freq: Four times a day (QID) | ORAL | Status: DC | PRN

## 2014-05-06 MED ORDER — AZITHROMYCIN 250 MG PO TABS
1000.0000 mg | ORAL_TABLET | Freq: Once | ORAL | Status: AC
  Administered 2014-05-06: 1000 mg via ORAL
  Filled 2014-05-06: qty 4

## 2014-05-06 MED ORDER — HYDROMORPHONE HCL 1 MG/ML IJ SOLN
1.0000 mg | Freq: Once | INTRAMUSCULAR | Status: AC
  Administered 2014-05-06: 1 mg via INTRAVENOUS
  Filled 2014-05-06: qty 1

## 2014-05-06 MED ORDER — METRONIDAZOLE 500 MG PO TABS
2000.0000 mg | ORAL_TABLET | Freq: Once | ORAL | Status: AC
  Administered 2014-05-06: 2000 mg via ORAL
  Filled 2014-05-06: qty 4

## 2014-05-06 NOTE — MAU Provider Note (Signed)
History     CSN: [id number]  Arrival date and time: 05/06/14 [id number]   First Provider Initiated Contact with Patient 05/06/14 0444      No chief complaint on file.  HPI  Ms. Shannon Randolph is a 23 y.o. G2P2002 at Unknown who [facility] [place] for further evaluation of abdominal pain in pregnancy. Report to Dr. Macon LargeAnyanwu from outside hospital was concern for possible PID. Records that accompanied the patient to MAU show patient had + trichomonas in urine and on wet prep, quant hCG of 921, WBCs 12.2, moderate leukocytes on UA with small blood and negative nitrites and US showed no IUGS or ectopic pregnancy. Patient arrived with IV Flagyl hanging. Gentamicin also accompanied the patient.   Patient states pain has been present x 2 weeks and is in the lower abdomen. She rates pain at 9/10 now. She has had 12 mg Morphine at outside hospital. She states LMP was ~ 04/16/14. She endorses dysuria and nausea without vomiting. She denies fever or recent UTI treatment.   Notes in Epic show admission in early November for PID vs Pyelonephritis. She was given Keflex and Doxycycline at time of discharge, although she states she has not recently been treated for UTI. Patient had Trichomonas then as well and was treated prior to admission. Patient is not forthcoming with PMH information and states frustrations about repeat questioning from other ED upon arrival in MAU.   OB History    Gravida Para Term Preterm AB TAB SAB Ectopic Multiple Living   3 2 2       2       Past Medical History  Diagnosis Date  . No pertinent past medical history   . Infection     UTI, gonorrhea  . Bacterial vaginosis     Past Surgical History  Procedure Laterality Date  . No past surgeries      Family History  Problem Relation Age of Onset  . Hearing loss Neg Hx     History  Substance Use Topics  . Smoking status: Former Smoker    Quit date: 01/11/2012  . Smokeless  tobacco: Former NeurosurgeonUser    Quit date: 01/11/2012  . Alcohol Use: No    Allergies: No Known Allergies  Prescriptions prior to admission  Medication Sig Dispense Refill Last Dose  . ciprofloxacin (CIPRO) 500 MG tablet Take 1 tablet (500 mg total) by mouth 2 (two) times daily. (Patient not taking: Reported on 05/06/2014) 14 tablet 0 Taking  . doxycycline (VIBRAMYCIN) 100 MG capsule Take 1 capsule (100 mg total) by mouth 2 (two) times daily. (Patient not taking: Reported on 05/06/2014) 21 capsule 0 Taking  . HYDROcodone-acetaminophen (NORCO/VICODIN) 5-325 MG per tablet Take 1 tablet by mouth every 6 (six) hours as needed for moderate pain. (Patient not taking: Reported on 05/06/2014) 20 tablet 0     Review of Systems  Constitutional: Negative for fever and malaise/fatigue.  Gastrointestinal: Positive for nausea and abdominal pain. Negative for vomiting, diarrhea and constipation.  Genitourinary: Negative for dysuria, urgency and frequency.       + vaginal bleeding, discharge   Physical Exam   Blood pressure 107/68, pulse 110, temperature 98.4 F (36.9 C), temperature source Oral, resp. rate 18, last menstrual period 04/16/2014, SpO2 100 %, not currently breastfeeding.  Physical Exam  Constitutional: She is oriented to person, place, and time. She appears well-developed and well-nourished. No distress.  HENT:  Head: Normocephalic.  Cardiovascular: Normal rate.  Respiratory: Effort normal.  GI: Soft. Bowel sounds are normal. She exhibits no distension and no mass. There is tenderness (diffuse mild tendernes to palpation worse in the lower abdomen bilaterally). There is no rebound and no guarding.  Neurological: She is alert and oriented to person, place, and time.  Skin: Skin is warm and dry. No erythema.  Psychiatric: She has a normal mood and affect.   Results for orders placed or performed during the hospital encounter of 05/06/14 (from the past 24 hour(s))  hCG, quantitative,  pregnancy     Status: Abnormal   Collection Time: 05/06/14  5:10 AM  Result Value Ref Range   hCG, Beta Chain, Quant, S 957 (H) <5 mIU/mL  Urinalysis, Routine w reflex microscopic     Status: Abnormal   Collection Time: 05/06/14  5:35 AM  Result Value Ref Range   Color, Urine YELLOW YELLOW   APPearance CLEAR CLEAR   Specific Gravity, Urine 1.025 1.005 - 1.030   pH 6.0 5.0 - 8.0   Glucose, UA NEGATIVE NEGATIVE mg/dL   Hgb urine dipstick NEGATIVE NEGATIVE   Bilirubin Urine NEGATIVE NEGATIVE   Ketones, ur NEGATIVE NEGATIVE mg/dL   Protein, ur NEGATIVE NEGATIVE mg/dL   Urobilinogen, UA 0.2 0.0 - 1.0 mg/dL   Nitrite NEGATIVE NEGATIVE   Leukocytes, UA MODERATE (A) NEGATIVE  Urine microscopic-add on     Status: None   Collection Time: 05/06/14  5:35 AM  Result Value Ref Range   Squamous Epithelial / LPF RARE RARE   WBC, UA 7-10 <3 WBC/hpf   Bacteria, UA RARE RARE    MAU Course  Procedures None  MDM Discussed patient with Dr. Macon LargeAnyanwu after evaluation.  12.5 mg Phenergan given IV Recommends treatment for trichomonas with 2 G Flagyl PO, presumptive GC/Chlamydia treatment with Rocephin and Zithromax.  Repeat quant hCG, GC/CT and HIV in MAU today Patient will need to return in 48 hours for repeat quant hCG secondary to pregnancy of unknown location 1 mg Dilaudid given in MAU. Patient reports improvement in pain Antibiotics given while in MAU. Patient vomited only after last 1000 mg Flagyl. All other medications had been taken > 30 minutes prior to emesis Patient offered food prior to re-taking 100 mg Flagyl. Will order breakfast and attempt PO intake prior to Flagyl administration.  Offered patient anti-emetics. Patient would like anti-emetics at this time. States continued nausea.  Discussed risks vs benefits of Zofran in early pregnancy. Patient voices understanding and would like to try Zofran at this time.  Discussed patient with Dr. Macon LargeAnyanwu again at 0755. 500 mg IV Flagyl or PO  Flagyl depending on patient's ability to take in PO. Then discharge home.  0800 - Report given to Wynelle BourgeoisMarie Williams, CNM  Assessment and Plan  A: Abdominal pain in pregnancy Pregnancy of unknown location Trichomonas infection  P: Discharge home Patient advised to use Tylenol PRN for pain Partner treatment is advised Ectopic precautions discussed Urine culture pending Patient to return to MAU in 48 hours for repeat quant hCG or sooner if symptoms were to persist or worsen  Marny LowensteinJulie N Wenzel, PA-C  05/06/2014, 8:14 AM   Will give final dose of Flagyl IV Then d/c home. RTO 2 days for Quant HCG Aviva SignsMarie L Williams, CNM

## 2014-05-06 NOTE — Discharge Instructions (Signed)

## 2014-05-06 NOTE — MAU Note (Signed)
Pt's nausea is better, antibiotics to be given.

## 2014-05-07 LAB — CULTURE, OB URINE
COLONY COUNT: NO GROWTH
Culture: NO GROWTH

## 2014-05-07 LAB — GC/CHLAMYDIA PROBE AMP
CT PROBE, AMP APTIMA: NEGATIVE
GC Probe RNA: NEGATIVE

## 2014-05-12 ENCOUNTER — Ambulatory Visit: Payer: Medicaid Other | Admitting: Family Medicine

## 2014-07-14 ENCOUNTER — Other Ambulatory Visit (HOSPITAL_COMMUNITY)
Admission: RE | Admit: 2014-07-14 | Discharge: 2014-07-14 | Disposition: A | Discharge status: Home / Self Care | Payer: Medicaid Other | Source: Ambulatory Visit | Attending: Family Medicine | Admitting: Family Medicine

## 2014-07-14 ENCOUNTER — Ambulatory Visit (INDEPENDENT_AMBULATORY_CARE_PROVIDER_SITE_OTHER): Payer: Medicaid Other | Admitting: Family Medicine

## 2014-07-14 ENCOUNTER — Encounter: Payer: Self-pay | Admitting: Family Medicine

## 2014-07-14 ENCOUNTER — Telehealth: Payer: Self-pay | Admitting: Family Medicine

## 2014-07-14 ENCOUNTER — Ambulatory Visit: Payer: Medicaid Other | Admitting: Family Medicine

## 2014-07-14 VITALS — BP 118/76 | HR 90 | Temp 97.9°F | Ht 63.0 in | Wt 154.3 lb

## 2014-07-14 DIAGNOSIS — Z309 Encounter for contraceptive management, unspecified: Secondary | ICD-10-CM

## 2014-07-14 DIAGNOSIS — Z01419 Encounter for gynecological examination (general) (routine) without abnormal findings: Secondary | ICD-10-CM | POA: Diagnosis not present

## 2014-07-14 DIAGNOSIS — N898 Other specified noninflammatory disorders of vagina: Secondary | ICD-10-CM

## 2014-07-14 DIAGNOSIS — Z113 Encounter for screening for infections with a predominantly sexual mode of transmission: Secondary | ICD-10-CM | POA: Insufficient documentation

## 2014-07-14 LAB — POCT WET PREP (WET MOUNT): CLUE CELLS WET PREP WHIFF POC: POSITIVE

## 2014-07-14 LAB — RPR

## 2014-07-14 MED ORDER — METRONIDAZOLE 500 MG PO TABS: 2000.0000 mg | ORAL_TABLET | Freq: Once | ORAL | Status: DC

## 2014-07-14 NOTE — Telephone Encounter (Signed)
BV and trich on wet prep. Treat with 2 G flagyl X 1.   Murtis SinkSam Bradshaw, MD [facility] Resident, PGY-3 07/14/2014, 2:11 PM

## 2014-07-14 NOTE — Patient Instructions (Signed)
PLease make a follow up appt with your PCP to get the nexplanon placed  Etonogestrel implant What is this medicine? ETONOGESTREL (et oh noe JES trel) is a contraceptive (birth control) device. It is used to prevent pregnancy. It can be used for up to 3 years. This medicine may be used for other purposes; ask your health care provider or pharmacist if you have questions. COMMON BRAND NAME(S): Implanon, Nexplanon What should I tell my health care provider before I take this medicine? They need to know if you have any of these conditions: -abnormal vaginal bleeding -blood vessel disease or blood clots -cancer of the breast, cervix, or liver -depression -diabetes -gallbladder disease -headaches -heart disease or recent heart attack -high blood pressure -high cholesterol -kidney disease -liver disease -renal disease -seizures -tobacco smoker -an unusual or allergic reaction to etonogestrel, other hormones, anesthetics or antiseptics, medicines, foods, dyes, or preservatives -pregnant or trying to get pregnant -breast-feeding How should I use this medicine? This device is inserted just under the skin on the inner side of your upper arm by a health care professional. Talk to your pediatrician regarding the use of this medicine in children. Special care may be needed. Overdosage: If you think you've taken too much of this medicine contact a poison control center or emergency room at once. Overdosage: If you think you have taken too much of this medicine contact a poison control center or emergency room at once. NOTE: This medicine is only for you. Do not share this medicine with others. What if I miss a dose? This does not apply. What may interact with this medicine? Do not take this medicine with any of the following medications: -amprenavir -bosentan -fosamprenavir This medicine may also interact with the following medications: -barbiturate medicines for inducing sleep or treating  seizures -certain medicines for fungal infections like ketoconazole and itraconazole -griseofulvin -medicines to treat seizures like carbamazepine, felbamate, oxcarbazepine, phenytoin, topiramate -modafinil -phenylbutazone -rifampin -some medicines to treat HIV infection like atazanavir, indinavir, lopinavir, nelfinavir, tipranavir, ritonavir -St. John's wort This list may not describe all possible interactions. Give your health care provider a list of all the medicines, herbs, non-prescription drugs, or dietary supplements you use. Also tell them if you smoke, drink alcohol, or use illegal drugs. Some items may interact with your medicine. What should I watch for while using this medicine? This product does not protect you against HIV infection (AIDS) or other sexually transmitted diseases. You should be able to feel the implant by pressing your fingertips over the skin where it was inserted. Tell your doctor if you cannot feel the implant. What side effects may I notice from receiving this medicine? Side effects that you should report to your doctor or health care professional as soon as possible: -allergic reactions like skin rash, itching or hives, swelling of the face, lips, or tongue -breast lumps -changes in vision -confusion, trouble speaking or understanding -dark urine -depressed mood -general ill feeling or flu-like symptoms -light-colored stools -loss of appetite, nausea -right upper belly pain -severe headaches -severe pain, swelling, or tenderness in the abdomen -shortness of breath, chest pain, swelling in a leg -signs of pregnancy -sudden numbness or weakness of the face, arm or leg -trouble walking, dizziness, loss of balance or coordination -unusual vaginal bleeding, discharge -unusually weak or tired -yellowing of the eyes or skin Side effects that usually do not require medical attention (Report these to your doctor or health care professional if they continue or  are bothersome.): -acne -breast  pain -changes in weight -cough -fever or chills -headache -irregular menstrual bleeding -itching, burning, and vaginal discharge -pain or difficulty passing urine -sore throat This list may not describe all possible side effects. Call your doctor for medical advice about side effects. You may report side effects to FDA at 1-800-FDA-1088. Where should I keep my medicine? This drug is given in a hospital or clinic and will not be stored at home. NOTE: This sheet is a summary. It may not cover all possible information. If you have questions about this medicine, talk to your doctor, pharmacist, or health care provider.  2015, Elsevier/Gold Standard. (2011-11-19 15:37:45)

## 2014-07-14 NOTE — Assessment & Plan Note (Signed)
Considering that she is a current every day smoker advised against OCPs Discussed the Nexplanon and she would like this, encouraged her to schedule follow-up appointment the next 1-2 weeks

## 2014-07-14 NOTE — Assessment & Plan Note (Signed)
Patient is largely asymptomatic but concerned about her discharge which is likely physiologic Recent abortion 3 weeks ago, lochia has completely resolved Wet prep, Pap smear, GC chlamydia today

## 2014-07-14 NOTE — Progress Notes (Signed)
Patient ID: Shannon SlickerRashanda Randolph, female   DOB: [birth year], 24 y.o.   MRN: [id number]   HPI  Patient presents today for vaginal discharge and follow-up after an abortion.  Patient explains that 3 weeks ago she had abortion. After that she was having clear vaginal discharge. For about 2 weeks after the abortion she had expected lochia which is completely resolved. She now has white to yellow vaginal discharge which she would like to make sure is not an infection. She denies any vaginal irritation. She states that she had smelly vaginal discharge for 1 day and but that is resolved.  She denies fevers, chills, sweats. She has normal by mouth intake.  She would like birth control  Smoking status noted -current every day smoker ROS: Per HPI  Objective: BP 118/76 mmHg  Pulse 90  Temp(Src) 97.9 F (36.6 C) (Oral)  Ht 5\' 3"  (1.6 m)  Wt 154 lb 4.8 oz (69.99 kg)  BMI 27.34 kg/m2  LMP 04/16/2014 (Approximate) Gen: NAD, alert, cooperative with exam HEENT: NCAT CV: RRR, good S1/S2, no murmur Resp: CTABL, no wheezes, non-labored Abd: SNTND, BS present, no guarding or organomegaly Ext: No edema, warm Neuro: Alert and oriented, No gross deficits  Assessment and plan:  Vaginal discharge Patient is largely asymptomatic but concerned about her discharge which is likely physiologic Recent abortion 3 weeks ago, lochia has completely resolved Wet prep, Pap smear, GC chlamydia today     Contraception Considering that she is a current every day smoker advised against OCPs Discussed the Nexplanon and she would like this, encouraged her to schedule follow-up appointment the next 1-2 weeks     Orders Placed This Encounter  Procedures  . POCT Wet Prep [facility]([place]

## 2014-07-15 ENCOUNTER — Telehealth: Payer: Self-pay | Admitting: Family Medicine

## 2014-07-15 LAB — HIV ANTIBODY (ROUTINE TESTING W REFLEX): HIV 1&2 Ab, 4th Generation: NONREACTIVE

## 2014-07-15 LAB — GC/CHLAMYDIA PROBE AMP (~~LOC~~) NOT AT ARMC
CHLAMYDIA, DNA PROBE: NEGATIVE
Neisseria Gonorrhea: NEGATIVE

## 2014-07-15 LAB — CYTOLOGY - PAP

## 2014-07-15 NOTE — Telephone Encounter (Signed)
Called and discussed labs. All normal except Pap was inadequate. Recommended repeat pap in 3-4 months at her convenience. This is due to poor cervical visualization.   Murtis SinkSam Dondrell Loudermilk, MD [facility] Resident, PGY-3 07/15/2014, 5:10 PM

## 2014-07-20 ENCOUNTER — Telehealth: Payer: Self-pay | Admitting: Family Medicine

## 2014-07-20 ENCOUNTER — Encounter: Payer: Self-pay | Admitting: Family Medicine

## 2014-07-20 MED ORDER — METRONIDAZOLE 500 MG PO TABS: 500.0000 mg | ORAL_TABLET | Freq: Two times a day (BID) | ORAL | Status: DC

## 2014-07-20 NOTE — Telephone Encounter (Signed)
Pt called and wanted to speak to Dr. Ermalinda MemosBradshaw concerning the medication she is taking. jw

## 2014-07-20 NOTE — Telephone Encounter (Signed)
Fixed. Shannon Randolph, CMA

## 2014-07-20 NOTE — Telephone Encounter (Signed)
Patient has taken her flagyl but hasnt had improvement in her vaginal dc. She took 2 grams at once.   She requests another course.   Recommended 500 BID X 7 days. Sent Rx  No vaginal irritation, no abd pain or fever. She had a an abortion a few weeks ago.   Murtis SinkSam Doyce Saling, MD [facility] Resident, PGY-3 07/20/2014, 2:12 PM

## 2014-09-06 ENCOUNTER — Ambulatory Visit (INDEPENDENT_AMBULATORY_CARE_PROVIDER_SITE_OTHER): Payer: Medicaid Other | Admitting: Family Medicine

## 2014-09-06 ENCOUNTER — Encounter: Payer: Self-pay | Admitting: Family Medicine

## 2014-09-06 VITALS — BP 109/75 | HR 119 | Temp 98.0°F | Ht 63.0 in | Wt 150.4 lb

## 2014-09-06 DIAGNOSIS — N760 Acute vaginitis: Secondary | ICD-10-CM

## 2014-09-06 DIAGNOSIS — R309 Painful micturition, unspecified: Secondary | ICD-10-CM

## 2014-09-06 LAB — POCT URINALYSIS DIPSTICK
BILIRUBIN UA: NEGATIVE
GLUCOSE UA: NEGATIVE
Ketones, UA: NEGATIVE
Nitrite, UA: NEGATIVE
PH UA: 7
Protein, UA: NEGATIVE
SPEC GRAV UA: 1.02
Urobilinogen, UA: 0.2

## 2014-09-06 LAB — POCT WET PREP (WET MOUNT): Clue Cells Wet Prep Whiff POC: POSITIVE

## 2014-09-06 LAB — POCT UA - MICROSCOPIC ONLY

## 2014-09-06 LAB — POCT URINE PREGNANCY: Preg Test, Ur: NEGATIVE

## 2014-09-06 MED ORDER — METRONIDAZOLE 500 MG PO TABS: 500.0000 mg | ORAL_TABLET | Freq: Two times a day (BID) | ORAL | Status: DC

## 2014-09-06 MED ORDER — CEPHALEXIN 500 MG PO CAPS: 500.0000 mg | ORAL_CAPSULE | Freq: Four times a day (QID) | ORAL | Status: DC

## 2014-09-06 NOTE — Patient Instructions (Signed)

## 2014-09-06 NOTE — Addendum Note (Signed)
Addended by: Jennette BillBUSICK, ROBERT L on: 09/06/2014 02:59 PM   Modules accepted: Orders

## 2014-09-06 NOTE — Progress Notes (Signed)
  Subjective:     Shannon SlickerRashanda Randolph is a 24 y.o. female who presents for evaluation of an abnormal vaginal discharge. Symptoms have been present for 5 days. Vaginal symptoms: discharge described as white and malodorous, odor and urinary symptoms of dysuria, urinary frequency and urinary urgency. Contraception: none. She denies abnormal bleeding, local irritation, post coital bleeding and warts Sexually transmitted infection risk: very low risk of STD exposure. Menstrual flow: regular every 28-30 days.      Review of Systems Pertinent items are noted in HPI.    Objective:    BP 109/75 mmHg  Pulse 119  Temp(Src) 98 F (36.7 C) (Oral)  Ht 5\' 3"  (1.6 m)  Wt 150 lb 6.4 oz (68.221 kg)  BMI 26.65 kg/m2  LMP 08/27/2014 General appearance: alert, cooperative and appears stated age Abdomen: soft, non-tender; bowel sounds normal; no masses,  no organomegaly  No CVA TTP B/L  Cardio: RRR   Assessment:    Vaginitis. Marland Kitchen.   UTI    Plan:    Symptomatic local care discussed. Wet Prep, UA w/ microscopy pending.  Will tx if indicated    Addendum: + for clue cells and UA showing large LE.  Tx with keflex and flagyl.  No alcohol

## 2014-09-06 NOTE — Progress Notes (Signed)
I was the preceptor on the day of this visit.   Wilma Michaelson MD  

## 2014-10-13 ENCOUNTER — Telehealth: Payer: Self-pay | Admitting: Family Medicine

## 2014-10-13 NOTE — Telephone Encounter (Signed)
Pt called because she was prescribed Keflex and Flagyl, The issue is that she went on vacation and left the medication in the hotel. She didn't get to finish the medication and she still has the issue. She is hoping that the doctor can call in a refill . jw

## 2014-10-14 MED ORDER — METRONIDAZOLE 500 MG PO TABS: 500.0000 mg | ORAL_TABLET | Freq: Two times a day (BID) | ORAL | Status: DC

## 2014-10-14 MED ORDER — CEPHALEXIN 500 MG PO CAPS: 500.0000 mg | ORAL_CAPSULE | Freq: Four times a day (QID) | ORAL | Status: DC

## 2014-10-14 NOTE — Telephone Encounter (Signed)
Shannon Randolph discussed with patient. Shannon Randolph was at the beach and only took 2-3 days of her and then lost the pills before she left. She still has foul-smelling vaginal discharge and would like to know if she can get a refill of the Flagyl so she can try again.  As pertains to her UTI she actually did not take Keflex at all and she still having symptoms of UTI. She filled the prescription and loss of the beach as well, I sent a new prescription for her.  Shannon SinkSam [name], MD [facility] Resident, PGY-3 10/14/2014, 9:53 AM

## 2014-11-04 ENCOUNTER — Ambulatory Visit: Payer: Medicaid Other | Admitting: Family Medicine

## 2014-11-18 ENCOUNTER — Encounter: Payer: Self-pay | Admitting: Family Medicine

## 2014-11-18 ENCOUNTER — Ambulatory Visit (INDEPENDENT_AMBULATORY_CARE_PROVIDER_SITE_OTHER): Payer: Medicaid Other | Admitting: Family Medicine

## 2014-11-18 VITALS — BP 119/49 | HR 88 | Temp 98.3°F | Ht 63.0 in | Wt 150.0 lb

## 2014-11-18 DIAGNOSIS — R21 Rash and other nonspecific skin eruption: Secondary | ICD-10-CM | POA: Diagnosis not present

## 2014-11-18 MED ORDER — HYDROXYZINE PAMOATE 25 MG PO CAPS: 25.0000 mg | ORAL_CAPSULE | Freq: Three times a day (TID) | ORAL | Status: DC | PRN

## 2014-11-18 MED ORDER — TRIAMCINOLONE ACETONIDE 0.1 % EX OINT: 1.0000 "application " | TOPICAL_OINTMENT | Freq: Two times a day (BID) | CUTANEOUS | Status: DC

## 2014-11-18 NOTE — Progress Notes (Signed)
   Subjective:    Patient ID: Shannon Randolph, female    DOB: [birth year], 24 y.o.   MRN: [id number]  Seen for Same day visit for   CC: rash  She reports painful itchy rash on upper back and neck as well as bilateral temporal scalp.  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  She denies any previous similar rashes.  Has any medical problems.  She denies any new contacts (soaps, detergents, perfumes, shampoos ) outside of work.  No other household members have rash or sore symptoms.  She denies family history of psoriasis.  Has a personal history of allergies or eczema.  Denies any outside time or but bites in the last several weeks.  She also reports she has been losing her hair on bilateral temporal areas.  Denies any fevers or chills.  Denies any nausea, vomiting, diarrhea or stomach pain.  Denies any muscle aches or joint pains.  Does endorse some general fatigue.  Denies taking any medications.   Review of Systems   See HPI for ROS. Objective:  BP 119/49 mmHg  Pulse 88  Temp(Src) 98.3 F (36.8 C) (Oral)  Ht 5\' 3"  (1.6 m)  Wt 150 lb (68.04 kg)  BMI 26.58 kg/m2  LMP 10/27/2014  General: NAD Skin: Multiple papules and ulcers. Papules are erythematous and approximately 3-5 mm in size on upper back and neck.  Ulcers are 1-2 cm in size on bilateral temporal scalp, slightly weepy.        Assessment & Plan:  See Problem List Documentation

## 2014-11-18 NOTE — Patient Instructions (Signed)
It was great seeing you today.   1. Apply triamcinolone to rash twice a day for 7 days.  2. Take Vistaril every 6 hours as needed for itching.  3. Ache and a histamine: Zyrtec, Allegra, Claritin nightly 4. Allergy to dermatology for further evaluation   Please bring all your medications to every doctors visit  Sign up for My Chart to have easy access to your labs results, and communication with your Primary care physician.  Next Appointment  Please make an appointment with Dr Gayla Doss in 1 week for re-evaluation   If you have any questions or concerns before then, please call the clinic at [phone].  Take Care,   Dr Wenda Low

## 2014-11-18 NOTE — Assessment & Plan Note (Signed)
Pruritic painful papule rash with superficial ulcers of uncertain etiology.  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  - HSV culture swab - Refer to dermatology - Triamcinolone 0.1% twice a day 1 week - Hydroxyzine 25 mg every 6 hours when necessary - Recommended taking daily and histamine, and follow up next week in clinic

## 2014-11-22 LAB — HERPES SIMPLEX VIRUS CULTURE: Organism ID, Bacteria: NOT DETECTED

## 2014-12-28 ENCOUNTER — Encounter (HOSPITAL_COMMUNITY): Payer: Self-pay | Admitting: Emergency Medicine

## 2014-12-28 ENCOUNTER — Emergency Department (HOSPITAL_COMMUNITY): Payer: Medicaid Other

## 2014-12-28 ENCOUNTER — Emergency Department (HOSPITAL_COMMUNITY)
Admission: EM | Admit: 2014-12-28 | Discharge: 2014-12-29 | Disposition: A | Discharge status: Home / Self Care | Payer: Medicaid Other | Attending: Emergency Medicine | Admitting: Emergency Medicine

## 2014-12-28 DIAGNOSIS — R102 Pelvic and perineal pain: Secondary | ICD-10-CM

## 2014-12-28 DIAGNOSIS — O98811 Other maternal infectious and parasitic diseases complicating pregnancy, first trimester: Secondary | ICD-10-CM | POA: Insufficient documentation

## 2014-12-28 DIAGNOSIS — Z7952 Long term (current) use of systemic steroids: Secondary | ICD-10-CM | POA: Insufficient documentation

## 2014-12-28 DIAGNOSIS — O9989 Other specified diseases and conditions complicating pregnancy, childbirth and the puerperium: Secondary | ICD-10-CM | POA: Diagnosis present

## 2014-12-28 DIAGNOSIS — O2340 Unspecified infection of urinary tract in pregnancy, unspecified trimester: Secondary | ICD-10-CM

## 2014-12-28 DIAGNOSIS — Z87891 Personal history of nicotine dependence: Secondary | ICD-10-CM | POA: Diagnosis not present

## 2014-12-28 DIAGNOSIS — O26899 Other specified pregnancy related conditions, unspecified trimester: Secondary | ICD-10-CM

## 2014-12-28 DIAGNOSIS — Z3A01 Less than 8 weeks gestation of pregnancy: Secondary | ICD-10-CM | POA: Insufficient documentation

## 2014-12-28 DIAGNOSIS — A599 Trichomoniasis, unspecified: Secondary | ICD-10-CM | POA: Diagnosis not present

## 2014-12-28 DIAGNOSIS — O2341 Unspecified infection of urinary tract in pregnancy, first trimester: Secondary | ICD-10-CM | POA: Insufficient documentation

## 2014-12-28 DIAGNOSIS — Z792 Long term (current) use of antibiotics: Secondary | ICD-10-CM | POA: Insufficient documentation

## 2014-12-28 DIAGNOSIS — Z8742 Personal history of other diseases of the female genital tract: Secondary | ICD-10-CM | POA: Diagnosis not present

## 2014-12-28 DIAGNOSIS — Z3490 Encounter for supervision of normal pregnancy, unspecified, unspecified trimester: Secondary | ICD-10-CM

## 2014-12-28 LAB — URINE MICROSCOPIC-ADD ON

## 2014-12-28 LAB — URINALYSIS, ROUTINE W REFLEX MICROSCOPIC
Bilirubin Urine: NEGATIVE
GLUCOSE, UA: NEGATIVE mg/dL
Hgb urine dipstick: NEGATIVE
KETONES UR: NEGATIVE mg/dL
NITRITE: NEGATIVE
PH: 6 (ref 5.0–8.0)
Protein, ur: NEGATIVE mg/dL
Specific Gravity, Urine: 1.016 (ref 1.005–1.030)
Urobilinogen, UA: 1 mg/dL (ref 0.0–1.0)

## 2014-12-28 LAB — COMPREHENSIVE METABOLIC PANEL
ALT: 13 U/L — AB (ref 14–54)
ANION GAP: 10 (ref 5–15)
AST: 14 U/L — AB (ref 15–41)
Albumin: 3.7 g/dL (ref 3.5–5.0)
Alkaline Phosphatase: 74 U/L (ref 38–126)
BUN: 9 mg/dL (ref 6–20)
CHLORIDE: 104 mmol/L (ref 101–111)
CO2: 22 mmol/L (ref 22–32)
CREATININE: 0.67 mg/dL (ref 0.44–1.00)
Calcium: 8.9 mg/dL (ref 8.9–10.3)
GFR calc non Af Amer: >60 mL/min (ref >60)
Glucose, Bld: 90 mg/dL (ref 65–99)
Potassium: 3.5 mmol/L (ref 3.5–5.1)
Sodium: 136 mmol/L (ref 135–145)
Total Bilirubin: 0.3 mg/dL (ref 0.3–1.2)
Total Protein: 6.5 g/dL (ref 6.5–8.1)

## 2014-12-28 LAB — CBC
HCT: 33 % — ABNORMAL LOW (ref 36.0–46.0)
Hemoglobin: 11.2 g/dL — ABNORMAL LOW (ref 12.0–15.0)
MCH: 28.1 pg (ref 26.0–34.0)
MCHC: 33.9 g/dL (ref 30.0–36.0)
MCV: 82.7 fL (ref 78.0–100.0)
PLATELETS: 244 10*3/uL (ref 150–400)
RBC: 3.99 MIL/uL (ref 3.87–5.11)
RDW: 13.2 % (ref 11.5–15.5)
WBC: 11.4 10*3/uL — AB (ref 4.0–10.5)

## 2014-12-28 LAB — ABO/RH: ABO/RH(D): B POS

## 2014-12-28 LAB — LIPASE, BLOOD: LIPASE: 25 U/L (ref 22–51)

## 2014-12-28 LAB — POC URINE PREG, ED: Preg Test, Ur: POSITIVE — AB

## 2014-12-28 MED ORDER — AZITHROMYCIN 250 MG PO TABS
1000.0000 mg | ORAL_TABLET | Freq: Once | ORAL | Status: AC
  Administered 2014-12-29: 1000 mg via ORAL
  Filled 2014-12-28: qty 4

## 2014-12-28 MED ORDER — ONDANSETRON 4 MG PO TBDP
4.0000 mg | ORAL_TABLET | Freq: Once | ORAL | Status: AC
  Administered 2014-12-29: 4 mg via ORAL
  Filled 2014-12-28: qty 1

## 2014-12-28 MED ORDER — CEFTRIAXONE SODIUM 250 MG IJ SOLR
250.0000 mg | Freq: Once | INTRAMUSCULAR | Status: AC
Start: 2014-12-29 — End: 2014-12-29
  Administered 2014-12-29: 250 mg via INTRAMUSCULAR
  Filled 2014-12-28: qty 250

## 2014-12-28 MED ORDER — ACETAMINOPHEN 500 MG PO TABS
1000.0000 mg | ORAL_TABLET | Freq: Once | ORAL | Status: AC
  Administered 2014-12-29: 1000 mg via ORAL
  Filled 2014-12-28: qty 2

## 2014-12-28 MED ORDER — NITROFURANTOIN MONOHYD MACRO 100 MG PO CAPS
100.0000 mg | ORAL_CAPSULE | Freq: Once | ORAL | Status: AC
  Administered 2014-12-29: 100 mg via ORAL
  Filled 2014-12-28: qty 1

## 2014-12-28 NOTE — ED Provider Notes (Signed)
CSN: [id number]     Arrival date & time 12/28/14  2136 History   First MD Initiated Contact with Patient 12/28/14 2227     Chief Complaint  Patient presents with  . Abdominal Pain     (Consider location/radiation/quality/duration/timing/severity/associated sxs/prior Treatment) HPI Comments: Patient is a 24 year old female presented to the emergency department for lower cramping abdominal pain that began 3 days ago. Patient states initially intermittent has become constant today. She is associated dysuria and vaginal discharge. She denies any fevers, chills, nausea, vomiting, diarrhea. Last menstrual period was one month ago. Does endorse recent unprotected sexual intercourse. History of 4 pregnancies, 2 live births and 2 abortions. No abdominal surgical history.   Past Medical History  Diagnosis Date  . No pertinent past medical history   . Infection     UTI, gonorrhea  . Bacterial vaginosis    Past Surgical History  Procedure Laterality Date  . No past surgeries     Family History  Problem Relation Age of Onset  . Hearing loss Neg Hx    History  Substance Use Topics  . Smoking status: Former Smoker    Quit date: 01/11/2012  . Smokeless tobacco: Former Neurosurgeon    Quit date: 01/11/2012  . Alcohol Use: No   OB History    Gravida Para Term Preterm AB TAB SAB Ectopic Multiple Living   3 2 2       2      Review of Systems  Constitutional: Negative for fever.  Genitourinary: Positive for dysuria and vaginal discharge.  All other systems reviewed and are negative.     Allergies  Review of patient's allergies indicates no known allergies.  Home Medications   Prior to Admission medications   Medication Sig Start Date End Date Taking? Authorizing Provider  cephALEXin (KEFLEX) 500 MG capsule Take 1 capsule (500 mg total) by mouth 4 (four) times daily. 10/14/14   Elenora Gamma, MD  hydrOXYzine (VISTARIL) 25 MG capsule Take 1 capsule (25 mg total) by mouth 3 (three) times  daily as needed. 11/18/14   Jamal Collin, MD  metroNIDAZOLE (FLAGYL) 500 MG tablet Take 1 tablet (500 mg total) by mouth 2 (two) times daily. Avoid alcohol while taking 10/14/14   Elenora Gamma, MD  metroNIDAZOLE (FLAGYL) 500 MG tablet Take 1 tablet (500 mg total) by mouth 2 (two) times daily. 12/29/14   Nika Yazzie, PA-C  nitrofurantoin, macrocrystal-monohydrate, (MACROBID) 100 MG capsule Take 1 capsule (100 mg total) by mouth 2 (two) times daily. 12/29/14   Francee Piccolo, PA-C  Prenatal Multivit-Min-Fe-FA (PRE-NATAL FORMULA) TABS Take 1 tablet by mouth daily. 12/29/14   Tc Kapusta, PA-C  traMADol (ULTRAM) 50 MG tablet Take 1 tablet (50 mg total) by mouth every 6 (six) hours as needed. 05/06/14   Aviva Signs, CNM  triamcinolone ointment (KENALOG) 0.1 % Apply 1 application topically 2 (two) times daily. 11/18/14   Jamal Collin, MD   BP 104/52 mmHg  Pulse 80  Temp(Src) 98.5 F (36.9 C) (Oral)  Resp 18  Wt 147 lb (66.679 kg)  SpO2 100%  LMP 11/26/2014 Physical Exam  Constitutional: She is oriented to person, place, and time. She appears well-developed and well-nourished. No distress.  HENT:  Head: Normocephalic and atraumatic.  Right Ear: External ear normal.  Left Ear: External ear normal.  Nose: Nose normal.  Mouth/Throat: Oropharynx is clear and moist.  Eyes: Conjunctivae are normal.  Neck: Normal range of motion. Neck supple.  No nuchal  rigidity.   Cardiovascular: Normal rate, regular rhythm and normal heart sounds.   Pulmonary/Chest: Effort normal and breath sounds normal.  Abdominal: Soft. She exhibits no distension. There is tenderness (suprapubic).  Musculoskeletal: Normal range of motion.  Neurological: She is alert and oriented to person, place, and time.  Skin: Skin is warm and dry. She is not diaphoretic.  Psychiatric: She has a normal mood and affect.  Nursing note and vitals reviewed. Exam performed by Francee Piccolo L,  exam  chaperoned Date: 12/29/2014 Pelvic exam: normal external genitalia without evidence of trauma. VULVA: normal appearing vulva with no masses, tenderness or lesion. VAGINA: normal appearing vagina with normal color and discharge, no lesions. CERVIX: normal appearing cervix without lesions, cervical motion tenderness absent, cervical os closed with out purulent discharge; vaginal discharge - green, Wet prep and DNA probe for chlamydia and GC obtained.   ADNEXA: normal adnexa in size, nontender and no masses UTERUS: uterus is normal size, shape, consistency and nontender.    ED Course  Procedures (including critical care time) Medications  azithromycin (ZITHROMAX) tablet 1,000 mg (1,000 mg Oral Given 12/29/14 0103)  cefTRIAXone (ROCEPHIN) injection 250 mg (250 mg Intramuscular Given 12/29/14 0058)  nitrofurantoin (macrocrystal-monohydrate) (MACROBID) capsule 100 mg (100 mg Oral Given 12/29/14 0105)  acetaminophen (TYLENOL) tablet 1,000 mg (1,000 mg Oral Given 12/29/14 0103)  ondansetron (ZOFRAN-ODT) disintegrating tablet 4 mg (4 mg Oral Given 12/29/14 0104)  lidocaine (PF) (XYLOCAINE) 1 % injection 2 mL (2 mLs Other Given 12/29/14 0058)    Labs Review Labs Reviewed  WET PREP, GENITAL - Abnormal; Notable for the following:    Trich, Wet Prep MODERATE (*)    WBC, Wet Prep HPF POC TOO NUMEROUS TO COUNT (*)    All other components within normal limits  COMPREHENSIVE METABOLIC PANEL - Abnormal; Notable for the following:    AST 14 (*)    ALT 13 (*)    All other components within normal limits  CBC - Abnormal; Notable for the following:    WBC 11.4 (*)    Hemoglobin 11.2 (*)    HCT 33.0 (*)    All other components within normal limits  URINALYSIS, ROUTINE W REFLEX MICROSCOPIC (NOT AT [facility]) - Abnormal; Notable for the following:    APPearance CLOUDY (*)    Leukocytes, UA LARGE (*)    All other components within normal limits  URINE MICROSCOPIC-ADD ON - Abnormal; Notable for the following:     Squamous Epithelial / LPF FEW (*)    Bacteria, UA FEW (*)    All other components within normal limits  POC URINE PREG, ED - Abnormal; Notable for the following:    Preg Test, Ur POSITIVE (*)    All other components within normal limits  LIPASE, BLOOD  RPR  HCG, QUANTITATIVE, PREGNANCY  HIV ANTIBODY (ROUTINE TESTING)  ABO/RH  GC/CHLAMYDIA PROBE AMP ([place]) NOT AT [facility]    Imaging Review US Ob Comp Less 14 Wks  12/29/2014   CLINICAL DATA:  Pelvic pain for 3 days  EXAM: OBSTETRIC <14 WK [place] AND TRANSVAGINAL OB US  TECHNIQUE: Both transabdominal and transvaginal ultrasound examinations were performed for complete evaluation of the gestation as well as the maternal uterus, adnexal regions, and pelvic cul-de-sac. Transvaginal technique was performed to assess early pregnancy.  COMPARISON:  None.  FINDINGS: Intrauterine gestational sac: Single  Yolk sac:  Yes  Embryo:  No  Cardiac Activity: No  MSD: 10.2  mm   5 w   5  d  Maternal uterus/adnexae: Normal  IMPRESSION: Probable early intrauterine gestational sac, but no yolk sac, fetal pole, or cardiac activity yet visualized. Recommend follow-up quantitative B-HCG levels and follow-up US in 14 days to confirm and assess viability. This recommendation follows SRU consensus guidelines: Diagnostic Criteria for Nonviable Pregnancy Early in the First Trimester. Malva Limes Med 2013; 829:5621-30.   Electronically Signed   By: Ellery Plunk M.D.   On: 12/29/2014 00:52   US Ob Transvaginal  12/29/2014   CLINICAL DATA:  Pelvic pain for 3 days  EXAM: OBSTETRIC <14 WK [place] AND TRANSVAGINAL OB US  TECHNIQUE: Both transabdominal and transvaginal ultrasound examinations were performed for complete evaluation of the gestation as well as the maternal uterus, adnexal regions, and pelvic cul-de-sac. Transvaginal technique was performed to assess early pregnancy.  COMPARISON:  None.  FINDINGS: Intrauterine gestational sac: Single  Yolk sac:  Yes  Embryo:  No  Cardiac  Activity: No  MSD: 10.2  mm   5 w   5  d  Maternal uterus/adnexae: Normal  IMPRESSION: Probable early intrauterine gestational sac, but no yolk sac, fetal pole, or cardiac activity yet visualized. Recommend follow-up quantitative B-HCG levels and follow-up US in 14 days to confirm and assess viability. This recommendation follows SRU consensus guidelines: Diagnostic Criteria for Nonviable Pregnancy Early in the First Trimester. Malva Limes Med 2013; 865:7846-96.   Electronically Signed   By: Ellery Plunk M.D.   On: 12/29/2014 00:52     EKG Interpretation None      MDM   Final diagnoses:  Pregnancy  UTI in pregnancy, unspecified trimester  Trichomonas infection    Filed Vitals:   12/29/14 0153  BP:   Pulse:   Temp: 98.5 F (36.9 C)  Resp:     Afebrile, NAD, non-toxic appearing, AAOx4.   I have reviewed nursing notes, vital signs, and all lab and all imaging results as noted above.  Patient for lower abdominal pain with associated dysuria and vaginal discharge 3 days. On examination mild suprapubic discomfort abdominal exam is otherwise unremarkable. Pelvic exams reveals green discharge, cervical os is closed. No cervical motion tenderness, adnexal mass or tenderness appreciated. Pregnancy test is positive. UA with evidence of UTI. Ultrasound obtained with abdominal pain and unconfirmed intrauterine pregnancy. Results consistent with early pregnancy, consistent with last menstrual cycle one month ago. Discussed wet prep results, lab results, UA results and ultrasound results with patient. Will treat prophylactically with azithromycin and Rocephin given wet prep and vaginal exam. Will place patient on Flagyl for Trichomonas treatment. Advised OB/GYN follow-up for repeat ultrasound and quadrant following. Return precautions discussed. Patient is agreeable to plan and stable at time of discharge.   Francee Piccolo, PA-C 12/29/14 0234  Blake Divine, MD 12/29/14 2306

## 2014-12-28 NOTE — ED Notes (Signed)
Pt. reports low abdominal pain with dysuria and vaginal discharge onset 3 days ago , denies fever or chills, no emesis or diarrhea.

## 2014-12-29 ENCOUNTER — Emergency Department (HOSPITAL_COMMUNITY): Payer: Medicaid Other

## 2014-12-29 LAB — WET PREP, GENITAL
Clue Cells Wet Prep HPF POC: NONE SEEN
Yeast Wet Prep HPF POC: NONE SEEN

## 2014-12-29 LAB — HIV ANTIBODY (ROUTINE TESTING W REFLEX): HIV Screen 4th Generation wRfx: NONREACTIVE

## 2014-12-29 LAB — GC/CHLAMYDIA PROBE AMP (~~LOC~~) NOT AT ARMC
CHLAMYDIA, DNA PROBE: NEGATIVE
NEISSERIA GONORRHEA: NEGATIVE

## 2014-12-29 LAB — RPR: RPR Ser Ql: NONREACTIVE

## 2014-12-29 MED ORDER — METRONIDAZOLE 500 MG PO TABS: 500.0000 mg | ORAL_TABLET | Freq: Two times a day (BID) | ORAL | Status: DC

## 2014-12-29 MED ORDER — PRE-NATAL FORMULA PO TABS: 1.0000 | ORAL_TABLET | Freq: Every day | ORAL | Status: DC

## 2014-12-29 MED ORDER — NITROFURANTOIN MONOHYD MACRO 100 MG PO CAPS: 100.0000 mg | ORAL_CAPSULE | Freq: Two times a day (BID) | ORAL | Status: DC

## 2014-12-29 MED ORDER — LIDOCAINE HCL (PF) 1 % IJ SOLN
2.0000 mL | Freq: Once | INTRAMUSCULAR | Status: AC
  Administered 2014-12-29: 2 mL
  Filled 2014-12-29: qty 5

## 2014-12-29 NOTE — ED Notes (Signed)
Pt is getting dressed.

## 2014-12-29 NOTE — Discharge Instructions (Signed)
Please follow up with your primary care physician in 1-2 days. If you do not have one please call the [facility] number listed above. Please follow up with an Ob/Gyn to schedule a follow up appointment.  Please take your antibiotic until completion. Please read all discharge instructions and return precautions.   Trichomoniasis Trichomoniasis is an infection caused by an organism called Trichomonas. The infection can affect both women and men. In women, the outer female genitalia and the vagina are affected. In men, the penis is mainly affected, but the prostate and other reproductive organs can also be involved. Trichomoniasis is a sexually transmitted infection (STI) and is most often passed to another person through sexual contact.  RISK FACTORS  Having unprotected sexual intercourse.  Having sexual intercourse with an infected partner. SIGNS AND SYMPTOMS  Symptoms of trichomoniasis in women include:  Abnormal gray-green frothy vaginal discharge.  Itching and irritation of the vagina.  Itching and irritation of the area outside the vagina. Symptoms of trichomoniasis in men include:   Penile discharge with or without pain.  Pain during urination. This results from inflammation of the urethra. DIAGNOSIS  Trichomoniasis may be found during a Pap test or physical exam. Your health care provider may use one of the following methods to help diagnose this infection:  Examining vaginal discharge under a microscope. For men, urethral discharge would be examined.  Testing the pH of the vagina with a test tape.  Using a vaginal swab test that checks for the Trichomonas organism. A test is available that provides results within a few minutes.  Doing a culture test for the organism. This is not usually needed. TREATMENT   You may be given medicine to fight the infection. Women should inform their health care provider if they could be or are pregnant. Some medicines used  to treat the infection should not be taken during pregnancy.  Your health care provider may recommend over-the-counter medicines or creams to decrease itching or irritation.  Your sexual partner will need to be treated if infected. HOME CARE INSTRUCTIONS   Take medicines only as directed by your health care provider.  Take over-the-counter medicine for itching or irritation as directed by your health care provider.  Do not have sexual intercourse while you have the infection.  Women should not douche or wear tampons while they have the infection.  Discuss your infection with your partner. Your partner may have gotten the infection from you, or you may have gotten it from your partner.  Have your sex partner get examined and treated if necessary.  Practice safe, informed, and protected sex.  See your health care provider for other STI testing. SEEK MEDICAL CARE IF:   You still have symptoms after you finish your medicine.  You develop abdominal pain.  You have pain when you urinate.  You have bleeding after sexual intercourse.  You develop a rash.  Your medicine makes you sick or makes you throw up (vomit). MAKE SURE YOU:  Understand these instructions.  Will watch your condition.  Will get help right away if you are not doing well or get worse. Document Released: 11/07/2000 Document Revised: 09/28/2013 Document Reviewed: 02/23/2013 [facility] Patient Information 2015 [place], [place]. This information is not intended to replace advice given to you by your health care provider. Make sure you discuss any questions you have with your health care provider. Medicines During Pregnancy During pregnancy, there are medicines that are either safe or unsafe to take. Medicines  include prescriptions from your caregiver, over-the-counter medicines, topical creams applied to the skin, and all herbal substances. Medicines are put into either Class A, B, C, or D. Class A and B medicines have  been shown to be safe in pregnancy. Class C medicines are also considered to be safe in pregnancy, but these medicines should only be used when necessary. Class D medicines should not be used at all in pregnancy. They can be harmful to a baby.  It is best to take as little medicine as possible while pregnant. However, some medicines are necessary to take for the mother and baby's health. Sometimes, it is more dangerous to stop taking certain medicines than to stay on them. This is often the case for people with long-term (chronic) conditions such as asthma, diabetes, or high blood pressure (hypertension). If you are pregnant and have a chronic illness, call your caregiver right away. Bring a list of your medicines and their doses to your appointments. If you are planning to become pregnant, schedule a doctor's appointment and discuss your medicines with your caregiver. Lastly, write down the phone number to your pharmacist. They can answer questions regarding a medicine's class and safety. They cannot give advice as to whether you should or should not be on a medicine.  SAFE AND UNSAFE MEDICINES There is a long list of medicines that are considered safe in pregnancy. Below is a shorter list. For specific medicines, ask your caregiver.  AllergyMedicines Loratadine, cetirizine, and chlorpheniramine are safe to take. Certain nasal steroid sprays are safe. Talk to your caregiver about specific brands that are safe. Analgesics Acetaminophen and acetaminophen with codeine are safe to take. All other nonsteroidal anti-inflammatory drugs (NSAIDS) are not safe. This includes ibuprofen.  Antacids Many over-the-counter antacids are safe to take. Talk to your caregiver about specific brands that are safe. Famotidine, ranitidine, and lansoprazole are safe. Omepresole is considered safe to take in the second trimester. Antibiotic Medicines There are several antibiotics to avoid. These include, but are not limited  to, tetracyline, quinolones, and sulfa medications. Talk to your caregiver before taking any antibiotic.  Antihistamines Talk to your caregiver about specific brands that are safe.  Asthma Medicines Most asthma steroid inhalers are safe to take. Talk to your caregiver for specific details. Calcium Calcium supplements are safe to take. Do not take oyster shell calcium.  Cough and Cold Medicines It is safe to take products with guaifenesin or dextromethorphan. Talk to your caregiver about specific brands that are safe. It is not safe to take products that contain aspirin or ibuprofen. Decongestant Medicines Pseudoephedrine-containing products are safe to take in the second and third trimester.  Depression Medicines Talk about these medicines with your caregiver.  Antidiarrheal Medicines It is safe to take loperamide. Talk to your caregiver about specific brands that are safe. It is not safe to take any antidiarrheal medicine that contains bismuth. Eyedrops Allergy eyedrops should be limited.  Iron It is safe to use certain iron-containing medicines for anemia in pregnancy. They require a prescription.  Antinausea Medicines It is safe to take doxylamine and vitamin B6 as directed. There are other prescription medicines available, if needed.  Sleep aids It is safe to take diphenhydramine and acetaminophen with diphenhydramine.  Steroids Hydrocortisone creams are safe to use as directed. Oral steroids require a prescription. It is not safe to take any hemorrhoid cream with pramoxine or phenylephrine. Stool softener It is safe to take stool softener medicines. Avoid daily or prolonged use of stool  softeners. Thyroid Medicine It is important to stay on this thyroid medicine. It needs to be followed by your caregiver.  Vaginal Medicines Your caregiver will prescribe a medicine to you if you have a vaginal infection. Certain antifungal medicines are safe to use if you have a  sexually transmitted infection (STI). Talk to your caregiver.  Document Released: 05/14/2005 Document Revised: 08/06/2011 Document Reviewed: 05/15/2011 [facility] Patient Information 2015 [place], [place]. This information is not intended to replace advice given to you by your health care provider. Make sure you discuss any questions you have with your health care provider.

## 2014-12-30 ENCOUNTER — Other Ambulatory Visit: Payer: Medicaid Other

## 2015-01-07 ENCOUNTER — Other Ambulatory Visit: Payer: Medicaid Other

## 2015-01-07 ENCOUNTER — Encounter: Payer: Medicaid Other | Admitting: Family Medicine

## 2015-01-22 ENCOUNTER — Encounter (HOSPITAL_COMMUNITY): Payer: Self-pay | Admitting: Emergency Medicine

## 2015-01-22 ENCOUNTER — Emergency Department (HOSPITAL_COMMUNITY)
Admission: EM | Admit: 2015-01-22 | Discharge: 2015-01-22 | Disposition: A | Discharge status: Home / Self Care | Payer: Medicaid Other | Attending: Emergency Medicine | Admitting: Emergency Medicine

## 2015-01-22 ENCOUNTER — Emergency Department (HOSPITAL_COMMUNITY)
Admission: EM | Admit: 2015-01-22 | Discharge: 2015-01-22 | Disposition: A | Discharge status: Home / Self Care | Payer: Medicaid Other | Source: Home / Self Care | Attending: Emergency Medicine | Admitting: Emergency Medicine

## 2015-01-22 DIAGNOSIS — K088 Other specified disorders of teeth and supporting structures: Secondary | ICD-10-CM | POA: Diagnosis present

## 2015-01-22 DIAGNOSIS — Z8742 Personal history of other diseases of the female genital tract: Secondary | ICD-10-CM | POA: Diagnosis not present

## 2015-01-22 DIAGNOSIS — T85848A Pain due to other internal prosthetic devices, implants and grafts, initial encounter: Secondary | ICD-10-CM

## 2015-01-22 DIAGNOSIS — Z8619 Personal history of other infectious and parasitic diseases: Secondary | ICD-10-CM | POA: Insufficient documentation

## 2015-01-22 DIAGNOSIS — T8484XA Pain due to internal orthopedic prosthetic devices, implants and grafts, initial encounter: Secondary | ICD-10-CM | POA: Insufficient documentation

## 2015-01-22 DIAGNOSIS — Z79899 Other long term (current) drug therapy: Secondary | ICD-10-CM | POA: Insufficient documentation

## 2015-01-22 DIAGNOSIS — K0889 Other specified disorders of teeth and supporting structures: Secondary | ICD-10-CM

## 2015-01-22 DIAGNOSIS — Z792 Long term (current) use of antibiotics: Secondary | ICD-10-CM | POA: Diagnosis not present

## 2015-01-22 DIAGNOSIS — Y831 Surgical operation with implant of artificial internal device as the cause of abnormal reaction of the patient, or of later complication, without mention of misadventure at the time of the procedure: Secondary | ICD-10-CM | POA: Insufficient documentation

## 2015-01-22 DIAGNOSIS — Z7952 Long term (current) use of systemic steroids: Secondary | ICD-10-CM | POA: Insufficient documentation

## 2015-01-22 MED ORDER — IBUPROFEN 800 MG PO TABS: 800.0000 mg | ORAL_TABLET | Freq: Three times a day (TID) | ORAL | Status: DC

## 2015-01-22 MED ORDER — BUPIVACAINE-EPINEPHRINE (PF) 0.5% -1:200000 IJ SOLN
1.8000 mL | Freq: Once | INTRAMUSCULAR | Status: AC
  Administered 2015-01-22: 1.8 mL
  Filled 2015-01-22: qty 1.8

## 2015-01-22 MED ORDER — BUPIVACAINE-EPINEPHRINE (PF) 0.5% -1:200000 IJ SOLN
1.8000 mL | Freq: Once | INTRAMUSCULAR | Status: DC
  Filled 2015-01-22: qty 1.8

## 2015-01-22 MED ORDER — PENICILLIN V POTASSIUM 500 MG PO TABS: 500.0000 mg | ORAL_TABLET | Freq: Three times a day (TID) | ORAL | Status: DC

## 2015-01-22 NOTE — ED Provider Notes (Signed)
CSN: [id number]     Arrival date & time 01/22/15  1943 History  This chart was scribed for non-physician practitioner, Earley Favor, NPworking with No att. providers found by Angelene Giovanni, ED Scribe. The patient was seen in room WTR9/WTR9 and the patient's care was started at 8:07 PM    Chief Complaint  Patient presents with  . Dental Pain   The history is provided by the patient. No language interpreter was used.   HPI Comments: Shannon Randolph is a 24 y.o. female who presents to the Emergency Department complaining of lower back dental pain onset 3 days ago. She reports that she took ibuprofen and hour ago with no relief. She states that she has a dentist appointment on 01/24/15 but cannot wait for 2 days with the pain. She is currently on her menstrual period.Pt is currently crying.   Past Medical History  Diagnosis Date  . No pertinent past medical history   . Infection     UTI, gonorrhea  . Bacterial vaginosis    Past Surgical History  Procedure Laterality Date  . No past surgeries     Family History  Problem Relation Age of Onset  . Hearing loss Neg Hx    Social History  Substance Use Topics  . Smoking status: Former Smoker    Quit date: 01/11/2012  . Smokeless tobacco: Former Neurosurgeon    Quit date: 01/11/2012  . Alcohol Use: No   OB History    Gravida Para Term Preterm AB TAB SAB Ectopic Multiple Living   Review of Systems  Constitutional: Negative for fever.  HENT: Positive for dental problem and ear pain. Negative for facial swelling.       Allergies  Review of patient's allergies indicates no known allergies.  Home Medications   Prior to Admission medications   Medication Sig Start Date End Date Taking? Authorizing Provider  cephALEXin (KEFLEX) 500 MG capsule Take 1 capsule (500 mg total) by mouth 4 (four) times daily. 10/14/14   Elenora Gamma, MD  hydrOXYzine (VISTARIL) 25 MG capsule Take 1 capsule (25 mg total) by mouth 3  (three) times daily as needed. 11/18/14   Jamal Collin, MD  ibuprofen (ADVIL,MOTRIN) 800 MG tablet Take 1 tablet (800 mg total) by mouth 3 (three) times daily. 01/22/15   Fayrene Helper, PA-C  metroNIDAZOLE (FLAGYL) 500 MG tablet Take 1 tablet (500 mg total) by mouth 2 (two) times daily. Avoid alcohol while taking 10/14/14   Elenora Gamma, MD  metroNIDAZOLE (FLAGYL) 500 MG tablet Take 1 tablet (500 mg total) by mouth 2 (two) times daily. 12/29/14   Jennifer Piepenbrink, PA-C  nitrofurantoin, macrocrystal-monohydrate, (MACROBID) 100 MG capsule Take 1 capsule (100 mg total) by mouth 2 (two) times daily. 12/29/14   Jennifer Piepenbrink, PA-C  penicillin v potassium (VEETID) 500 MG tablet Take 1 tablet (500 mg total) by mouth 3 (three) times daily. 01/22/15   Fayrene Helper, PA-C  Prenatal Multivit-Min-Fe-FA (PRE-NATAL FORMULA) TABS Take 1 tablet by mouth daily. 12/29/14   Jennifer Piepenbrink, PA-C  traMADol (ULTRAM) 50 MG tablet Take 1 tablet (50 mg total) by mouth every 6 (six) hours as needed. 05/06/14   Aviva Signs, CNM  triamcinolone ointment (KENALOG) 0.1 % Apply 1 application topically 2 (two) times daily. 11/18/14   Jamal Collin, MD   BP 119/101 mmHg  Pulse 75  Temp(Src) 98.4 F (36.9 C) (Oral)  Resp  21  SpO2 100%  LMP 11/26/2014 Physical Exam  Constitutional: She is oriented to person, place, and time. She appears well-developed and well-nourished. No distress.  HENT:  Head: Normocephalic and atraumatic.  Mouth/Throat:    Eyes: Conjunctivae and EOM are normal.  Neck: Neck supple. No tracheal deviation present.  Cardiovascular: Normal rate.   Pulmonary/Chest: Effort normal. No respiratory distress.  Musculoskeletal: Normal range of motion.  Neurological: She is alert and oriented to person, place, and time.  Skin: Skin is warm and dry.  Psychiatric: She has a normal mood and affect. Her behavior is normal.  Nursing note and vitals reviewed.   ED Course  Procedures (including  critical care time) DIAGNOSTIC STUDIES: Oxygen Saturation is 100% on RA, normal by my interpretation.    COORDINATION OF CARE: 8:09 PM- Pt advised of plan for treatment and pt agrees.    Labs Review Labs Reviewed - No data to display  Imaging Review No results found. I have personally reviewed and evaluated these images and lab results as part of my medical decision-making.   EKG Interpretation None      MDM   Final diagnoses:  None    I personally performed the services described in this documentation, which was scribed in my presence. The recorded information has been reviewed and is accurate.   Earley Favor, NP 01/22/15 1610  Lavera Guise, MD 01/23/15 [phone]

## 2015-01-22 NOTE — Discharge Instructions (Signed)
Please call and follow up with a dentist in the next 2 days for further management of your dental pain.   Dental Pain A tooth ache may be caused by cavities (tooth decay). Cavities expose the nerve of the tooth to air and hot or cold temperatures. It may come from an infection or abscess (also called a boil or furuncle) around your tooth. It is also often caused by dental caries (tooth decay). This causes the pain you are having. DIAGNOSIS  Your caregiver can diagnose this problem by exam. TREATMENT   If caused by an infection, it may be treated with medications which kill germs (antibiotics) and pain medications as prescribed by your caregiver. Take medications as directed.  Only take over-the-counter or prescription medicines for pain, discomfort, or fever as directed by your caregiver.  Whether the tooth ache today is caused by infection or dental disease, you should see your dentist as soon as possible for further care. SEEK MEDICAL CARE IF: The exam and treatment you received today has been provided on an emergency basis only. This is not a substitute for complete medical or dental care. If your problem worsens or new problems (symptoms) appear, and you are unable to meet with your dentist, call or return to this location. SEEK IMMEDIATE MEDICAL CARE IF:   You have a fever.  You develop redness and swelling of your face, jaw, or neck.  You are unable to open your mouth.  You have severe pain uncontrolled by pain medicine. MAKE SURE YOU:   Understand these instructions.  Will watch your condition.  Will get help right away if you are not doing well or get worse. Document Released: 05/14/2005 Document Revised: 08/06/2011 Document Reviewed: 12/31/2007 [facility] Patient Information 2015 [place], [place]. This information is not intended to replace advice given to you by your health care provider. Make sure you discuss any questions you have with your health care provider.

## 2015-01-22 NOTE — ED Provider Notes (Signed)
CSN: [id number]     Arrival date & time 01/22/15  0907 History   First MD Initiated Contact with Patient 01/22/15 1007     Chief Complaint  Patient presents with  . Dental Pain     (Consider location/radiation/quality/duration/timing/severity/associated sxs/prior Treatment) HPI   24 year old female presents complaining of dental pain.for the past 3 days patient has had persistent throbbing non radiating pain to her left lower molar. Pain is 10 out of 10, worsening with chewing, and cold air. Pain is now radiates to the left side of face and ear.patient has tried taking Goody's powder, and ibuprofen without adequate relief. No fever or chills hearing changes sore throat or neck pain.  Past Medical History  Diagnosis Date  . No pertinent past medical history   . Infection     UTI, gonorrhea  . Bacterial vaginosis    Past Surgical History  Procedure Laterality Date  . No past surgeries     Family History  Problem Relation Age of Onset  . Hearing loss Neg Hx    Social History  Substance Use Topics  . Smoking status: Former Smoker    Quit date: 01/11/2012  . Smokeless tobacco: Former Neurosurgeon    Quit date: 01/11/2012  . Alcohol Use: No   OB History    Gravida Para Term Preterm AB TAB SAB Ectopic Multiple Living   Review of Systems  Constitutional: Negative for fever.  HENT: Positive for dental problem.   Skin: Negative for rash.      Allergies  Review of patient's allergies indicates no known allergies.  Home Medications   Prior to Admission medications   Medication Sig Start Date End Date Taking? Authorizing Provider  cephALEXin (KEFLEX) 500 MG capsule Take 1 capsule (500 mg total) by mouth 4 (four) times daily. 10/14/14   Elenora Gamma, MD  hydrOXYzine (VISTARIL) 25 MG capsule Take 1 capsule (25 mg total) by mouth 3 (three) times daily as needed. 11/18/14   Jamal Collin, MD  metroNIDAZOLE (FLAGYL) 500 MG tablet Take 1 tablet (500 mg total) by  mouth 2 (two) times daily. Avoid alcohol while taking 10/14/14   Elenora Gamma, MD  metroNIDAZOLE (FLAGYL) 500 MG tablet Take 1 tablet (500 mg total) by mouth 2 (two) times daily. 12/29/14   Jennifer Piepenbrink, PA-C  nitrofurantoin, macrocrystal-monohydrate, (MACROBID) 100 MG capsule Take 1 capsule (100 mg total) by mouth 2 (two) times daily. 12/29/14   Francee Piccolo, PA-C  Prenatal Multivit-Min-Fe-FA (PRE-NATAL FORMULA) TABS Take 1 tablet by mouth daily. 12/29/14   Jennifer Piepenbrink, PA-C  traMADol (ULTRAM) 50 MG tablet Take 1 tablet (50 mg total) by mouth every 6 (six) hours as needed. 05/06/14   Aviva Signs, CNM  triamcinolone ointment (KENALOG) 0.1 % Apply 1 application topically 2 (two) times daily. 11/18/14   Jamal Collin, MD   BP 102/79 mmHg  Pulse 85  Temp(Src) 98.1 F (36.7 C) (Oral)  Resp 17  SpO2 100%  LMP 11/26/2014 Physical Exam  Constitutional: She appears well-developed and well-nourished. No distress.  HENT:  Head: Atraumatic.  Mouth: Tenderness to tooth #17. Mild dental decay noted. No trismus. No gingival erythema and no abscess.  Eyes: Conjunctivae are normal.  Neck: Neck supple.  Neurological: She is alert.  Skin: No rash noted.  Psychiatric: She has a normal mood and affect.  Nursing note and vitals reviewed.   ED Course  NERVE  BLOCK Date/Time: 01/22/2015 10:38 AM Performed by: Fayrene Helper Authorized by: Fayrene Helper Consent: Verbal consent obtained. Risks and benefits: risks, benefits and alternatives were discussed Consent given by: patient Patient understanding: patient states understanding of the procedure being performed Patient consent: the patient's understanding of the procedure matches consent given Patient identity confirmed: verbally with patient and arm band Time out: Immediately prior to procedure a "time out" was called to verify the correct patient, procedure, equipment, support staff and site/side marked as  required. Indications: pain relief Body area: face/mouth Nerve: inferior alveolar Laterality: left Patient sedated: no Patient position: sitting Needle gauge: 27 G Location technique: anatomical landmarks Local anesthetic: bupivacaine 0.5% with epinephrine Anesthetic total: 1.8 ml Outcome: pain improved Patient tolerance: Patient tolerated the procedure well with no immediate complications   (including critical care time)  Patient presents with dental pain. She is amenable for dental nerve block.  She will be d/c with abx, NSAIDs and dental referral.    Labs Review Labs Reviewed - No data to display  Imaging Review No results found. I have personally reviewed and evaluated these images and lab results as part of my medical decision-making.   EKG Interpretation None      MDM   Final diagnoses:  Pain, dental    BP 102/79 mmHg  Pulse 85  Temp(Src) 98.1 F (36.7 C) (Oral)  Resp 17  SpO2 100%  LMP 11/26/2014     Fayrene Helper, PA-C 01/22/15 1038  Linwood Dibbles, MD 01/23/15 [phone]

## 2015-01-22 NOTE — ED Notes (Signed)
Pt arrived to the ED with dental pain.  Pt states pain is on the left lower portion of her jaw.  Pt was seen this am for same, given a nerve block and a prescription for ibuprofen but states pain is unbearable.

## 2015-01-22 NOTE — Discharge Instructions (Signed)
Keep your dentist appointment as scheduled on Monday

## 2015-01-22 NOTE — ED Notes (Signed)
Pt c/o left side dental pain in upper and lower x 3 days.

## 2015-01-24 ENCOUNTER — Ambulatory Visit (INDEPENDENT_AMBULATORY_CARE_PROVIDER_SITE_OTHER): Payer: Medicaid Other | Admitting: *Deleted

## 2015-01-24 DIAGNOSIS — Z111 Encounter for screening for respiratory tuberculosis: Secondary | ICD-10-CM | POA: Diagnosis not present

## 2015-01-24 NOTE — Progress Notes (Signed)
   PPD placed Left Forearm.  Pt to return 01/26/15 for reading.  Pt tolerated intradermal injection. Clovis Pu, RN

## 2015-01-26 ENCOUNTER — Encounter: Payer: Self-pay | Admitting: *Deleted

## 2015-01-26 ENCOUNTER — Ambulatory Visit (INDEPENDENT_AMBULATORY_CARE_PROVIDER_SITE_OTHER): Payer: Medicaid Other | Admitting: *Deleted

## 2015-01-26 DIAGNOSIS — Z111 Encounter for screening for respiratory tuberculosis: Secondary | ICD-10-CM

## 2015-01-26 DIAGNOSIS — Z7689 Persons encountering health services in other specified circumstances: Secondary | ICD-10-CM

## 2015-01-26 LAB — TB SKIN TEST
Induration: 0 mm
TB Skin Test: NEGATIVE

## 2015-01-26 NOTE — Progress Notes (Signed)
   PPD Reading Note PPD read and results entered in Epic[employer]. Result: 0 mm induration. Interpretation: Negative If test not read within 48-72 hours of initial placement, patient advised to repeat in other arm 1-3 weeks after this test. Allergic reaction: no .mmd

## 2015-03-17 ENCOUNTER — Ambulatory Visit: Payer: Medicaid Other | Admitting: Family Medicine

## 2015-03-30 ENCOUNTER — Telehealth: Payer: Self-pay | Admitting: Family Medicine

## 2015-03-30 ENCOUNTER — Ambulatory Visit (INDEPENDENT_AMBULATORY_CARE_PROVIDER_SITE_OTHER): Payer: Medicaid Other | Admitting: Family Medicine

## 2015-03-30 ENCOUNTER — Other Ambulatory Visit (HOSPITAL_COMMUNITY)
Admission: RE | Admit: 2015-03-30 | Discharge: 2015-03-30 | Disposition: A | Discharge status: Home / Self Care | Payer: Medicaid Other | Source: Ambulatory Visit | Attending: Family Medicine | Admitting: Family Medicine

## 2015-03-30 VITALS — BP 114/76 | HR 78 | Temp 98.6°F | Wt 146.8 lb

## 2015-03-30 DIAGNOSIS — Z23 Encounter for immunization: Secondary | ICD-10-CM | POA: Diagnosis not present

## 2015-03-30 DIAGNOSIS — N926 Irregular menstruation, unspecified: Secondary | ICD-10-CM | POA: Diagnosis present

## 2015-03-30 DIAGNOSIS — N898 Other specified noninflammatory disorders of vagina: Secondary | ICD-10-CM | POA: Diagnosis not present

## 2015-03-30 DIAGNOSIS — Z202 Contact with and (suspected) exposure to infections with a predominantly sexual mode of transmission: Secondary | ICD-10-CM | POA: Diagnosis not present

## 2015-03-30 DIAGNOSIS — Z113 Encounter for screening for infections with a predominantly sexual mode of transmission: Secondary | ICD-10-CM | POA: Insufficient documentation

## 2015-03-30 LAB — POCT WET PREP (WET MOUNT)
Clue Cells Wet Prep Whiff POC: POSITIVE
WBC, Wet Prep HPF POC: >20

## 2015-03-30 LAB — POCT URINE PREGNANCY: PREG TEST UR: NEGATIVE

## 2015-03-30 MED ORDER — METRONIDAZOLE 500 MG PO TABS: 500.0000 mg | ORAL_TABLET | Freq: Two times a day (BID) | ORAL | Status: DC

## 2015-03-30 NOTE — Patient Instructions (Signed)
Thank you for coming to see me today. It was a pleasure. Today we talked about:   Vaginal discharge: I will let you know the results of your wet prep today.   If you have any questions or concerns, please do not hesitate to call the office at [phone].  Sincerely,  Jacquelin Hawkingalph Mardie Kellen, MD

## 2015-03-30 NOTE — Telephone Encounter (Signed)
Discussed results. Sent in Metronidazole 500mg  BID x7 days.

## 2015-03-30 NOTE — Progress Notes (Signed)
    Subjective   Darl HouseholderRashanda Randolph is a 24 y.o. female that presents for a same day visit  1. Vaginal discharge: Symptoms started two days ago. She had foul, yellow discharge with some burning and itching. She is sexually active with men only. She has two current partners. She has two partners in the last. She does not use contraception. She uses condoms intermittently. She has been treated for Chlamydia in the past.  ROS Per HPI  Social History  Substance Use Topics  . Smoking status: Former Smoker    Quit date: 01/11/2012  . Smokeless tobacco: Former NeurosurgeonUser    Quit date: 01/11/2012  . Alcohol Use: No    No Known Allergies  Objective   BP 114/76 mmHg  Pulse 78  Temp(Src) 98.6 F (37 C) (Oral)  Wt 146 lb 12.8 oz (66.588 kg)  General: Well appearing, no distress Genitourinary: External vagina appears normal without lesions. Vaginal canal with milky discharge and foul odor     Assessment and Plan   Meds ordered this encounter  Medications  . metroNIDAZOLE (FLAGYL) 500 MG tablet    Sig: Take 1 tablet (500 mg total) by mouth 2 (two) times daily.    Dispense:  14 tablet    Refill:  0    Bacterial vaginosis: confirmed on wet prep  Metronidazole 500mg  BID x7 days  GC/Chlamydia pending  Possible exposure to sexually transmitted infections - HIV, syphilis

## 2015-03-31 LAB — HIV ANTIBODY (ROUTINE TESTING W REFLEX): HIV: NONREACTIVE

## 2015-03-31 LAB — RPR

## 2015-04-01 ENCOUNTER — Encounter: Payer: Self-pay | Admitting: Family Medicine

## 2015-04-01 LAB — CERVICOVAGINAL ANCILLARY ONLY
Chlamydia: NEGATIVE
NEISSERIA GONORRHEA: NEGATIVE

## 2015-04-13 ENCOUNTER — Ambulatory Visit: Payer: Medicaid Other | Admitting: Family Medicine

## 2015-05-09 ENCOUNTER — Other Ambulatory Visit (HOSPITAL_COMMUNITY)
Admission: RE | Admit: 2015-05-09 | Discharge: 2015-05-09 | Disposition: A | Discharge status: Home / Self Care | Payer: Medicaid Other | Source: Ambulatory Visit | Attending: Family Medicine | Admitting: Family Medicine

## 2015-05-09 ENCOUNTER — Ambulatory Visit (INDEPENDENT_AMBULATORY_CARE_PROVIDER_SITE_OTHER): Payer: Medicaid Other | Admitting: Family Medicine

## 2015-05-09 VITALS — BP 116/64 | HR 106 | Temp 98.3°F | Wt 149.0 lb

## 2015-05-09 DIAGNOSIS — N898 Other specified noninflammatory disorders of vagina: Secondary | ICD-10-CM | POA: Diagnosis not present

## 2015-05-09 DIAGNOSIS — Z113 Encounter for screening for infections with a predominantly sexual mode of transmission: Secondary | ICD-10-CM | POA: Diagnosis present

## 2015-05-09 DIAGNOSIS — Z202 Contact with and (suspected) exposure to infections with a predominantly sexual mode of transmission: Secondary | ICD-10-CM | POA: Diagnosis not present

## 2015-05-09 LAB — POCT WET PREP (WET MOUNT)
Clue Cells Wet Prep Whiff POC: POSITIVE
WBC, Wet Prep HPF POC: >20

## 2015-05-09 MED ORDER — AZITHROMYCIN 500 MG PO TABS
1000.0000 mg | ORAL_TABLET | Freq: Once | ORAL | Status: AC
  Administered 2015-05-09: 1000 mg via ORAL

## 2015-05-09 MED ORDER — CEFTRIAXONE SODIUM 250 MG IJ SOLR: 250.0000 mg | Freq: Once | INTRAMUSCULAR | Status: DC

## 2015-05-09 MED ORDER — CEFTRIAXONE SODIUM 250 MG IJ SOLR
250.0000 mg | Freq: Once | INTRAMUSCULAR | Status: AC
  Administered 2015-05-09: 250 mg via INTRAMUSCULAR

## 2015-05-09 MED ORDER — METRONIDAZOLE 500 MG PO TABS: 500.0000 mg | ORAL_TABLET | Freq: Two times a day (BID) | ORAL | Status: DC

## 2015-05-09 MED ORDER — AZITHROMYCIN 500 MG PO TABS: 1000.0000 mg | ORAL_TABLET | Freq: Once | ORAL | Status: DC

## 2015-05-09 NOTE — Progress Notes (Signed)
VAGINAL DISCHARGE Was treated for BV w/ flagyl. Got better for 2 weeks and then symptoms came back. Odor and discharge are the same as it had been prior. She denies any fevers, chills, dysuria, dyspareunia, abdominal pain, or skin lesions. Patient is no longer having intercourse with the previously mentioned partners (prior visit), however she is now having intercourse with a new female sexual partner which she endorses regular condom use with the exception of one occasion. She denies any recent oral sex, sore throat, or difficulty swallowing.  Having vaginal discharge for 3 days. Discharge is: white discharge, foul odor Sex in last month: 1 new partner since last visit.  Possible STD exposure: has been using condoms, did NOT use on 1 occasion.  Symptoms Dysuria: no Abdomen or pelvic pain: no Back pain: no Genital sores or ulcers: no Pain during sex: no  ROS see HPI Smoking Status noted  Objective: BP 116/64 mmHg  Pulse 106  Temp(Src) 98.3 F (36.8 C) (Oral)  Wt 149 lb (67.586 kg)  LMP 04/29/2015 (Exact Date) Gen: NAD, alert, cooperative, melancholy mood Resp:  non-labored Abd: SNTND, BS present, no guarding or organomegaly Pelvic: cervix normal in appearance, external genitalia normal and positive findings: malodorous white milky discharge noted, significant cervical motion tenderness noted on bimanual exam  Assessment and plan:  Vaginal discharge Patient presented today with a 3 day history of white malodorous vaginal discharge. Patient was recently treated for BV with a 7 day course of metronidazole. Patient reported excellent compliance with this medication. Symptoms were relieved for approximately 2 weeks until now. Signs and symptoms most consistent with BV, however on physical exam substantial cervical motion tenderness was noted. Wet prep yielded many clue cells as well as numerous leukocytes. Because patient is at high risk, BV does not typically cause leukocytes on wet prep,  and the physical exam findings of cervical motion tenderness -- patient will be treated empirically for gonorrhea/chlamydia. - 250 mg of ceftriaxone IM once in the office - 1000 mg azithromycin once ordered - 500 mg metronidazole twice a day 7 days ordered for BV - Will contact patient with the results of the GC chlamydia probe was obtained today.    Orders Placed This Encounter  Procedures  . POCT Wet Prep [facility] [place]    Meds ordered this encounter  Medications  . DISCONTD: cefTRIAXone (ROCEPHIN) 250 MG injection    Sig: Inject 250 mg into the muscle once.  FOR IM use in LARGE MUSCLE MASS    Dispense:  1 each    Refill:  0  . azithromycin (ZITHROMAX) 500 MG tablet    Sig: Take 2 tablets (1,000 mg total) by mouth once.    Dispense:  2 tablet    Refill:  0  . DISCONTD: cefTRIAXone (ROCEPHIN) 250 MG injection    Sig: Inject 250 mg into the muscle once.  FOR IM use in LARGE MUSCLE MASS    Dispense:  1 each    Refill:  0  . metroNIDAZOLE (FLAGYL) 500 MG tablet    Sig: Take 1 tablet (500 mg total) by mouth 2 (two) times daily.    Dispense:  14 tablet    Refill:  0  . azithromycin (ZITHROMAX) tablet 1,000 mg    Sig:   . cefTRIAXone (ROCEPHIN) injection 250 mg    Sig:      Kathee DeltonIan D McKeag, MD,MS,  PGY2 05/09/2015 1:29 PM

## 2015-05-09 NOTE — Patient Instructions (Signed)
Gonorrhea Gonorrhea is an infection that can cause serious problems. If left untreated, the infection may:   Damage the female or female organs.   Cause women to be unable to have children (sterility).   Harm a fetus if the infected woman is pregnant.  It is important to get treatment for gonorrhea as soon as possible. It is also necessary that all your sexual partners be tested for the infection.  CAUSES  Gonorrhea is caused by bacteria called Neisseria gonorrhoeae. The infection is spread from person to person, usually by sexual contact (such as by anal, vaginal, or oral means). A newborn can contract the infection from his or her mother during birth.  RISK FACTORS  Being a woman younger than 25 years of age who is sexually active.  Being a woman 25 years of age or older who has:  A new sex partner.  More than one sex partner.  A sex partner who has a sexually transmitted disease (STD).  Using condoms inconsistently.  Currently having, or having previously had, an STD.  Exchanging sex or money or drugs. SYMPTOMS  Some people with gonorrhea do not have symptoms. Symptoms may be different in females and males.  Females The most common symptoms are:   Pain in the lower abdomen.   Fever with or without chills.  Other symptoms include:   Abnormal vaginal discharge.   Painful intercourse.   Burning or itching of the vagina or lips of the vagina.   Abnormal vaginal bleeding.   Pain when urinating.   Long-lasting (chronic) pain in the lower abdomen, especially during menstruation or intercourse.   Inability to become pregnant.   Going into premature labor.   Irritation, pain, bleeding, or discharge from the rectum. This may occur if the infection was spread by anal sex.   Sore throat or swollen lymph nodes in the neck. This may occur if the infection was spread by oral sex.  Males The most common symptoms are:   Discharge from the penis.   Pain  or burning during urination.   Pain or swelling in the testicles. Other symptoms may include:   Irritation, pain, bleeding, or discharge from the rectum. This may occur if the infection was spread by anal sex.   Sore throat, fever, or swollen lymph nodes in the neck. This may occur if the infection was spread by oral sex.  DIAGNOSIS  A diagnosis is made after a physical exam is done and a sample of discharge is examined under a microscope for the presence of the bacteria. The discharge may be taken from the urethra, cervix, throat, or rectum.  TREATMENT  Gonorrhea is treated with antibiotic medicines. It is important for treatment to begin as soon as possible. Early treatment may prevent some problems from developing. Do not have sex. Avoid all types of sexual activity for 7 days after treatment is complete and until any sex partners have been treated. HOME CARE INSTRUCTIONS   Take medicines only as directed by your health care provider.   Take your antibiotic medicine as directed by your health care provider. Finish the antibiotic even if you start to feel better. Incomplete treatment will put you at risk for continued infection.   Do not have sex until treatment is complete or as directed by your health care provider.   Keep all follow-up visits as directed by your health care provider.   Not all test results are available during your visit. If your test results are not back   during the visit, make an appointment with your health care provider to find out the results. Do not assume everything is normal if you have not heard from your health care provider or the medical facility. It is your responsibility to get your test results.  If you test positive for gonorrhea, inform your recent sexual partners. They need to be checked for gonorrhea even if they do not have symptoms. They may need treatment, even if they test negative for gonorrhea.  SEEK MEDICAL CARE IF:   You develop any  bad reaction to the medicine you were prescribed. This may include:   A rash.   Nausea.   Vomiting.   Diarrhea.   Your symptoms do not improve after a few days of taking antibiotics.   Your symptoms get worse.   You develop increased pain, such as in the testicles (for males) or in the abdomen (for females).  You have a fever. MAKE SURE YOU:   Understand these instructions.  Will watch your condition.  Will get help right away if you are not doing well or get worse.   This information is not intended to replace advice given to you by your health care provider. Make sure you discuss any questions you have with your health care provider.   Document Released: 05/11/2000 Document Revised: 06/04/2014 Document Reviewed: 11/19/2012 Elsevier Interactive Patient Education 2016 Else[employer] Inc.   Chlamydia, Female Chlamydia is an infection. It is spread through sexual contact. Chlamydia can be in different areas of the body. These areas include the cervix, urethra, throat, or rectum. You may not know you have chlamydia because many people never develop the symptoms. Chlamydia is not difficult to treat once you know you have it. However, if it is left untreated, chlamydia can lead to more serious health problems.  CAUSES  Chlamydia is caused by bacteria. It is a sexually transmitted disease. It is passed from an infected partner during intimate contact. This contact could be with the genitals, mouth, or rectal area. Chlamydia can also be passed from mothers to babies during birth. SIGNS AND SYMPTOMS  There may not be any symptoms. This is often the case early in the infection. If symptoms develop, they may include:  Mild pain and discomfort when urinating.  Redness, soreness, and swelling (inflammation) of the rectum.  Vaginal discharge.  Painful intercourse.  Abdominal pain.  Bleeding between menstrual periods. DIAGNOSIS  To diagnose this infection, your health care  provider will do a pelvic exam. Cultures will be taken of the vagina, cervix, urine, and possibly the rectum to verify the diagnosis.  TREATMENT You will be given antibiotic medicines. If you are pregnant, certain types of antibiotics will need to be avoided. Any sexual partners should also be treated, even if they do not show symptoms. Your health care provider may test you for infection again 3 months after treatment. HOME CARE INSTRUCTIONS   Take your antibiotic medicine as directed by your health care provider. Finish the antibiotic even if you start to feel better.  Take medicines only as directed by your health care provider.  Inform any sexual partners about the infection. They should also be treated.  Do not have sexual contact until your health care provider tells you it is okay.  Get plenty of rest.  Eat a well-balanced diet.  Drink enough fluids to keep your urine clear or pale yellow.  Keep all follow-up visits as directed by your health care provider. SEEK MEDICAL CARE IF:  You have  painful urination.  You have abdominal pain.  You have vaginal discharge.  You have painful sexual intercourse.  You have bleeding between periods and after sex.  You have a fever. SEEK IMMEDIATE MEDICAL CARE IF:   You experience nausea or vomiting.  You experience excessive sweating (diaphoresis).  You have difficulty swallowing.   This information is not intended to replace advice given to you by your health care provider. Make sure you discuss any questions you have with your health care provider.   Document Released: 02/21/2005 Document Revised: 02/02/2015 Document Reviewed: 01/19/2013 Elsevier Interactive Patient Education [employer]2016 Else[employer] Inc.

## 2015-05-09 NOTE — Assessment & Plan Note (Signed)
Patient presented today with a 3 day history of white malodorous vaginal discharge. Patient was recently treated for BV with a 7 day course of metronidazole. Patient reported excellent compliance with this medication. Symptoms were relieved for approximately 2 weeks until now. Signs and symptoms most consistent with BV, however on physical exam substantial cervical motion tenderness was noted. Wet prep yielded many clue cells as well as numerous leukocytes. Because patient is at high risk, BV does not typically cause leukocytes on wet prep, and the physical exam findings of cervical motion tenderness -- patient will be treated empirically for gonorrhea/chlamydia. - 250 mg of ceftriaxone IM once in the office - 1000 mg azithromycin once ordered - 500 mg metronidazole twice a day 7 days ordered for BV - Will contact patient with the results of the GC chlamydia probe was obtained today.

## 2015-05-10 LAB — CERVICOVAGINAL ANCILLARY ONLY
Chlamydia: NEGATIVE
Neisseria Gonorrhea: NEGATIVE

## 2015-05-12 ENCOUNTER — Encounter: Payer: Self-pay | Admitting: Family Medicine

## 2015-08-01 ENCOUNTER — Ambulatory Visit: Payer: Medicaid Other | Admitting: Family Medicine

## 2015-11-28 ENCOUNTER — Ambulatory Visit: Payer: Medicaid Other | Admitting: Family Medicine

## 2015-12-09 IMAGING — US US PELVIS COMPLETE
1 series · 14 of 25 positions shown · non-contrast
Comparison: None

CLINICAL DATA: Persistent lower abdominal and pelvic pain

EXAM:
TRANSABDOMINAL AND TRANSVAGINAL ULTRASOUND OF PELVIS
TECHNIQUE: Study was performed transabdominally to optimize pelvic field of
view evaluation and transvaginally to optimize internal visceral
architecture evaluation.

[Series 1: us pelvis complete · 0.24mm/px · 14 of 63 slices shown]
[im 1/63]
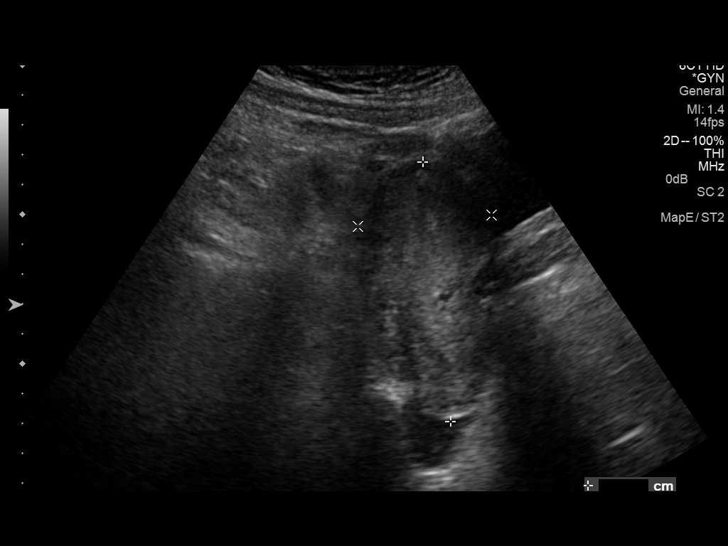
[im 6/63]
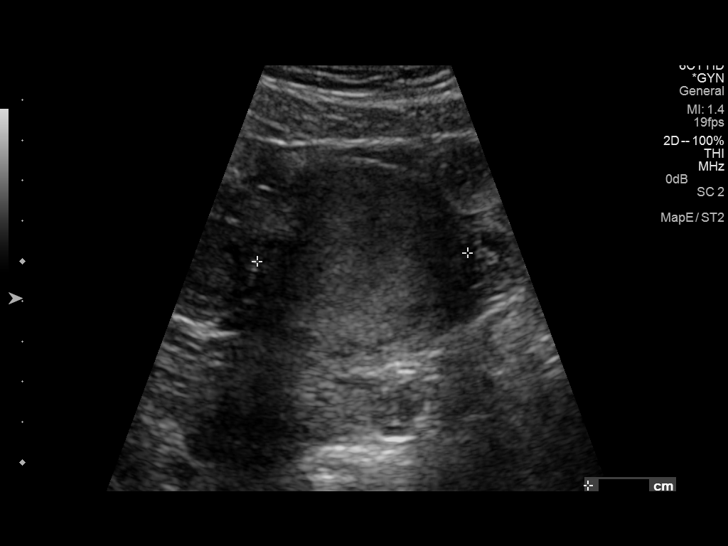
[im 11/63]
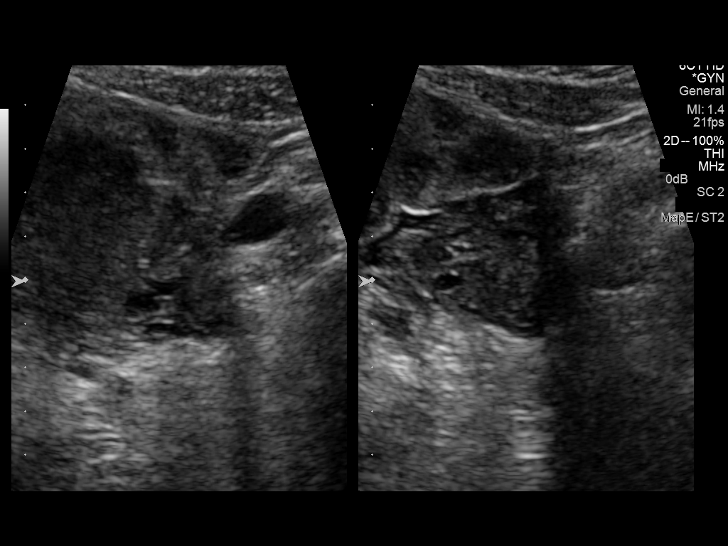
[im 16/63]
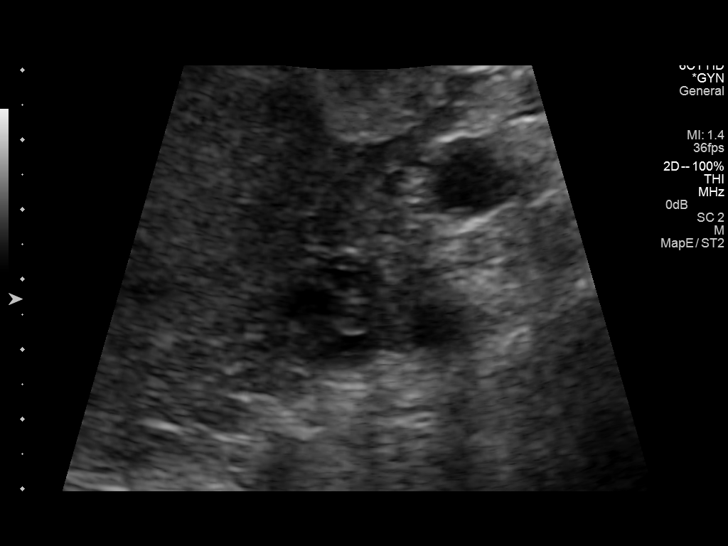
[im 21/63]
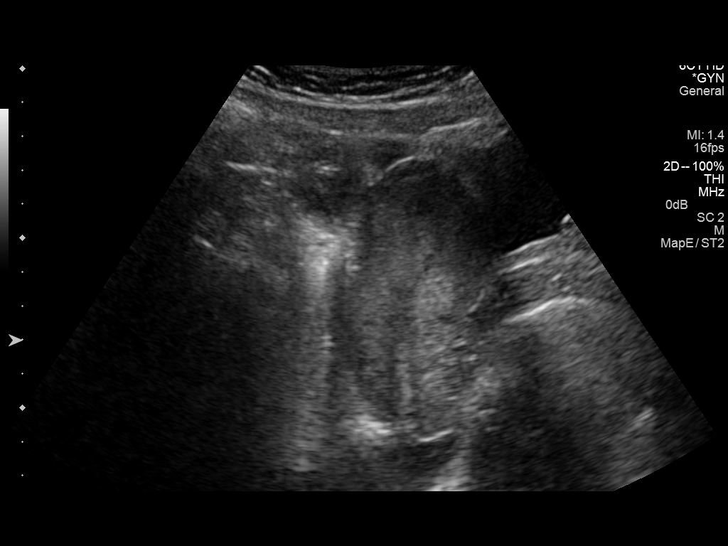
[im 24/63]
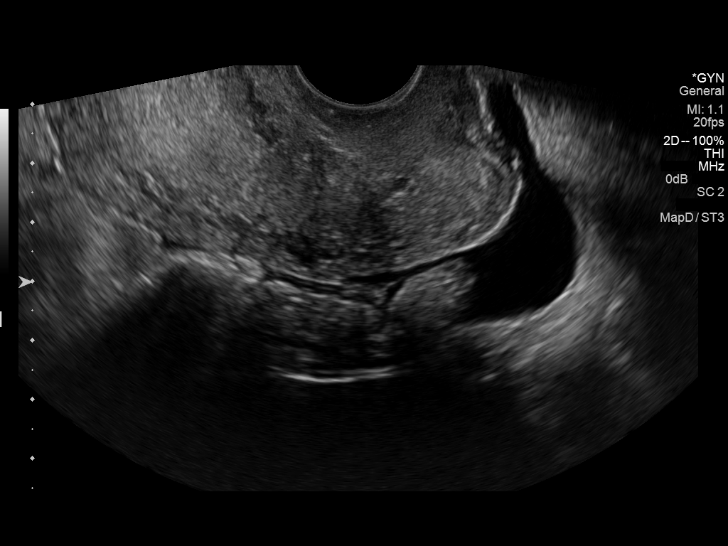
[im 29/63]
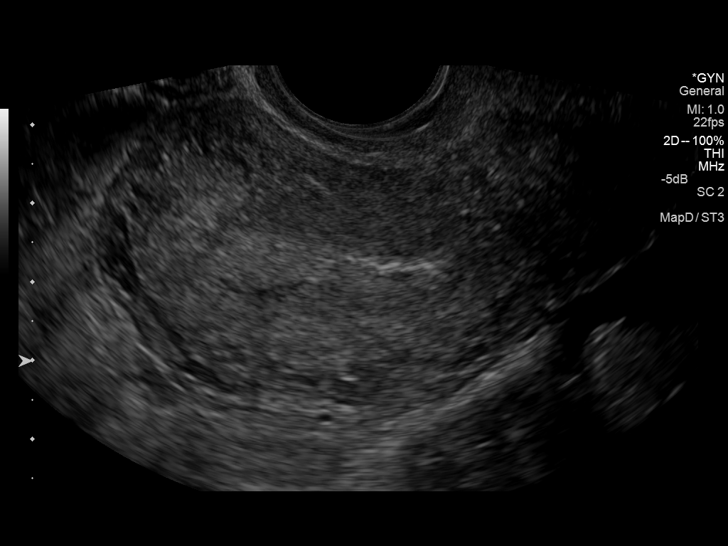
[im 34/63]
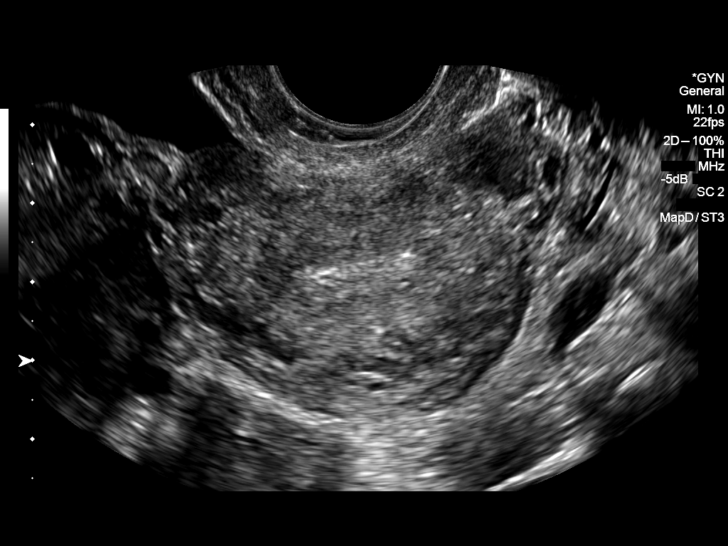
[im 39/63]
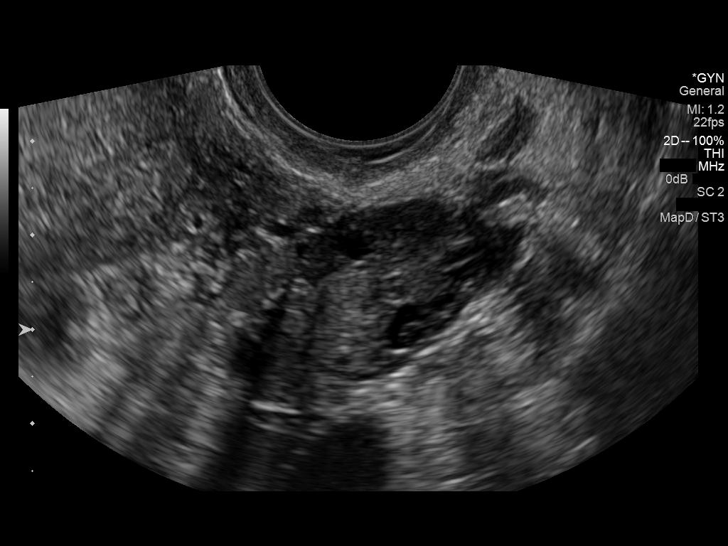
[im 42/63]
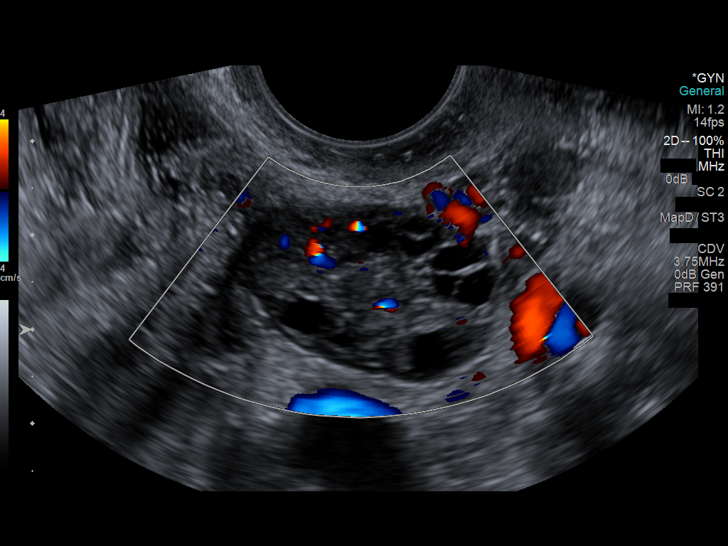
[im 47/63]
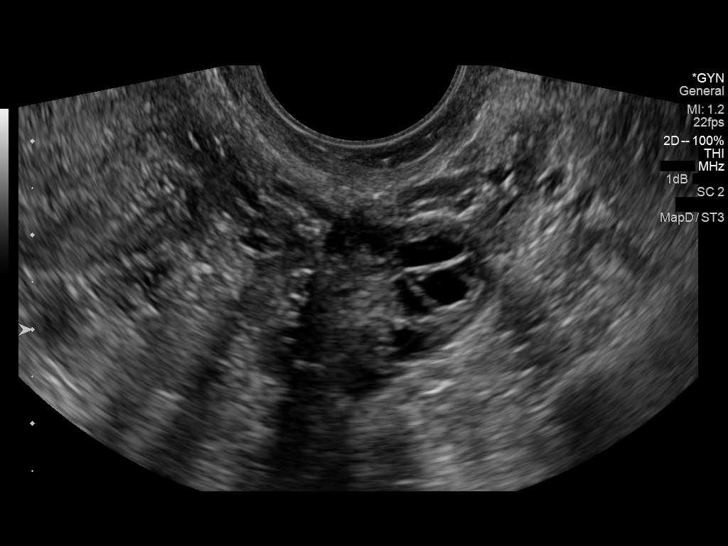
[im 52/63]
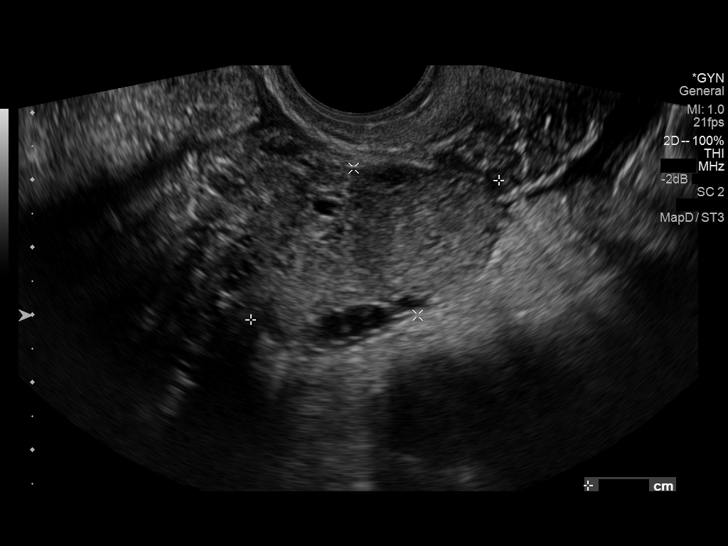
[im 57/63]
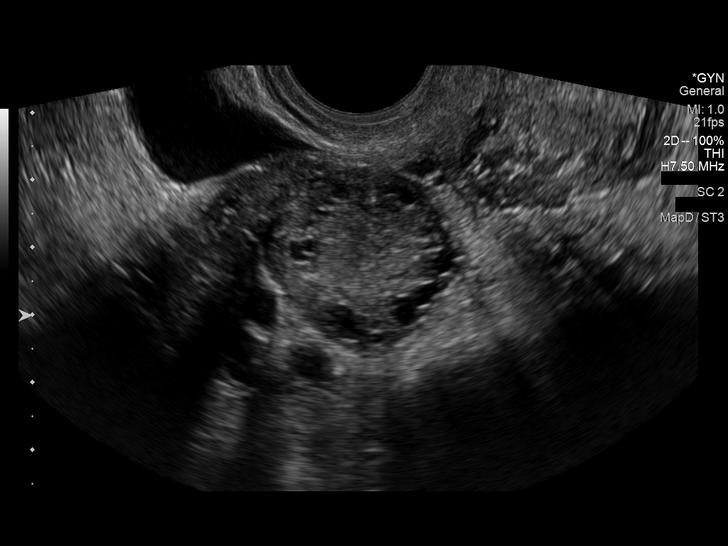
[im 63/63]
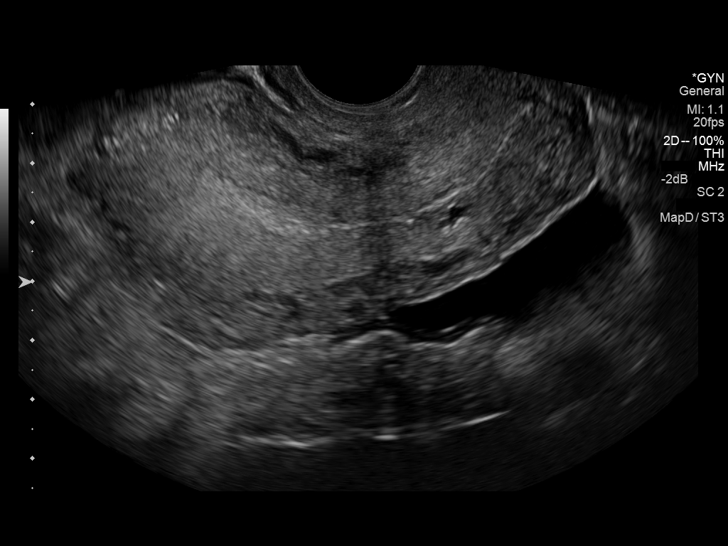

[14 of 25 positions shown; findings below may reference images not displayed]

FINDINGS: Uterus

Measurements: 8.7 x 4.0 x 5.2 cm. Uterus is anteverted. No fibroids
or other mass visualized.

Endometrium

Thickness: 7 mm.  No focal abnormality visualized.

Right ovary

Measurements: 4.2 x 2.4 x 2.8 cm. Normal appearance/no adnexal mass.

Left ovary

Measurements: 2.5 x 1.6 x 3.0 cm. Normal appearance/no adnexal mass.

Other findings

There is a fairly small amount of free pelvic fluid.
IMPRESSION: Fairly small amount of free pelvic fluid. This finding could
indicate recent ovarian cyst rupture. There is no intrauterine or
extrauterine pelvic or adnexal mass.

## 2015-12-12 IMAGING — US US PELVIS COMPLETE
1 series · 14 of 25 positions shown · non-contrast
Comparison: None

CLINICAL DATA: Left lower quadrant pelvic pain

EXAM:
TRANSABDOMINAL AND TRANSVAGINAL ULTRASOUND OF PELVIS
TECHNIQUE: Both transabdominal and transvaginal ultrasound examinations of the
pelvis were performed. Transabdominal technique was performed for
global imaging of the pelvis including uterus, ovaries, adnexal
regions, and pelvic cul-de-sac. It was necessary to proceed with
endovaginal exam following the transabdominal exam to visualize the
endometrium and ovaries.

[Series 1: us pelvis complete · 0.21mm/px · 14 of 55 slices shown]
[im 1/55]
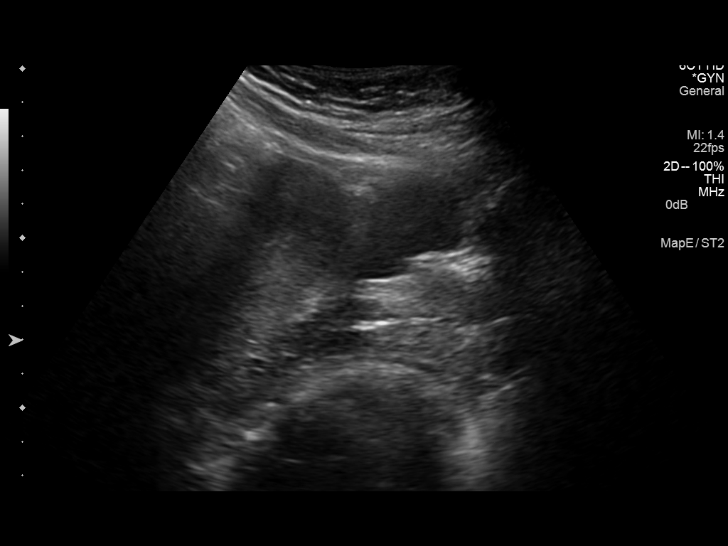
[im 5/55]
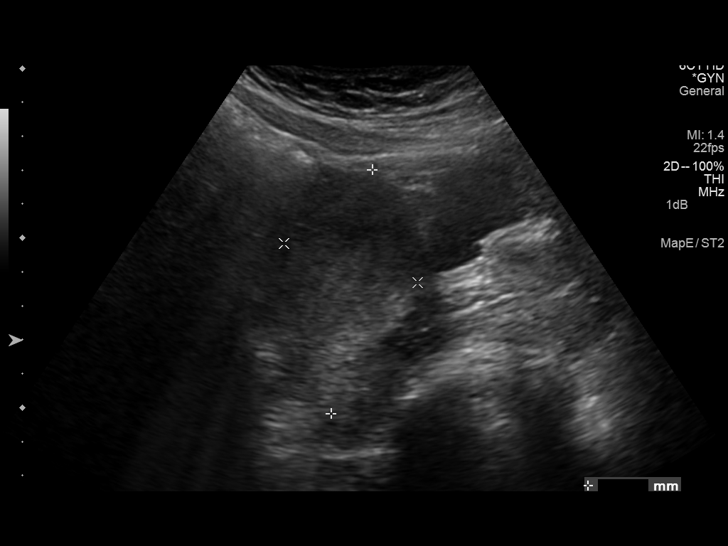
[im 10/55]
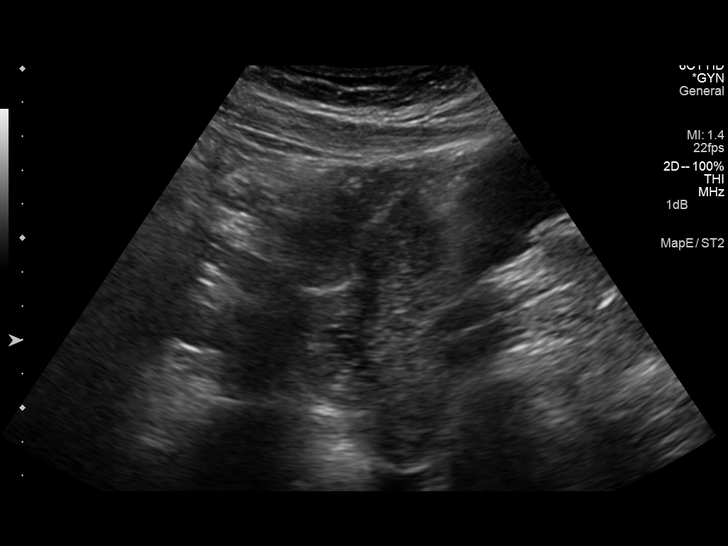
[im 14/55]
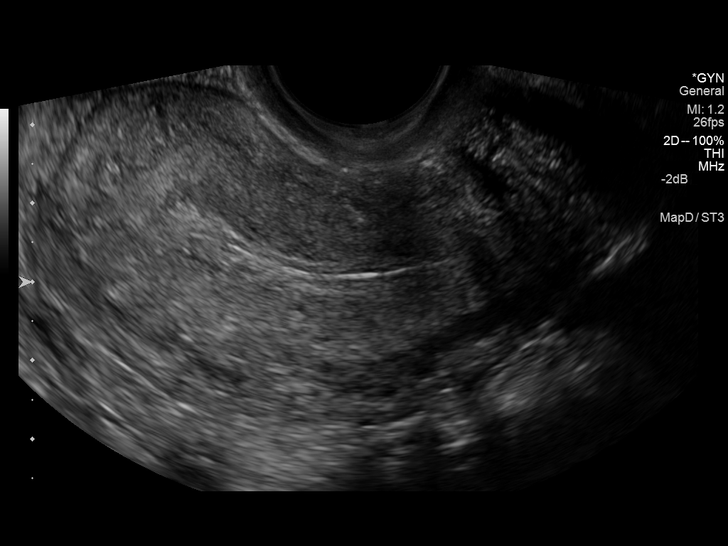
[im 19/55]
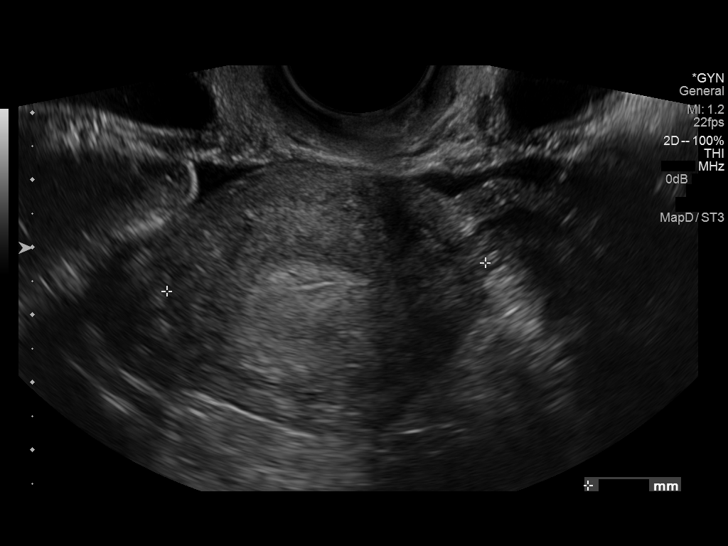
[im 21/55]
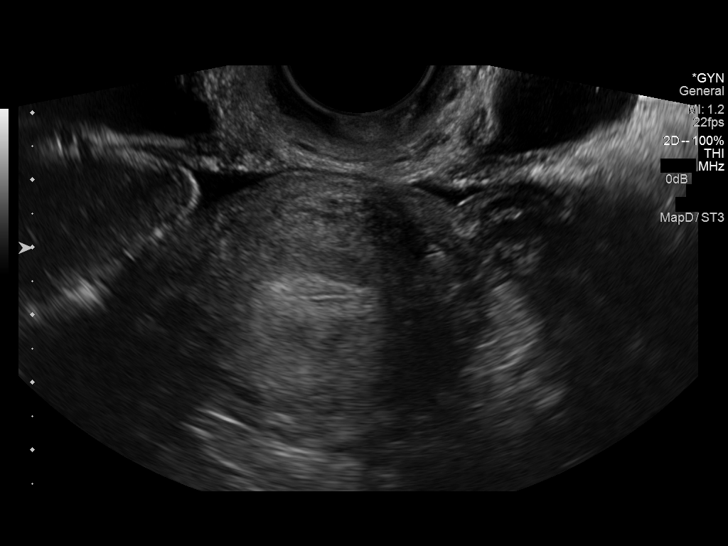
[im 25/55]
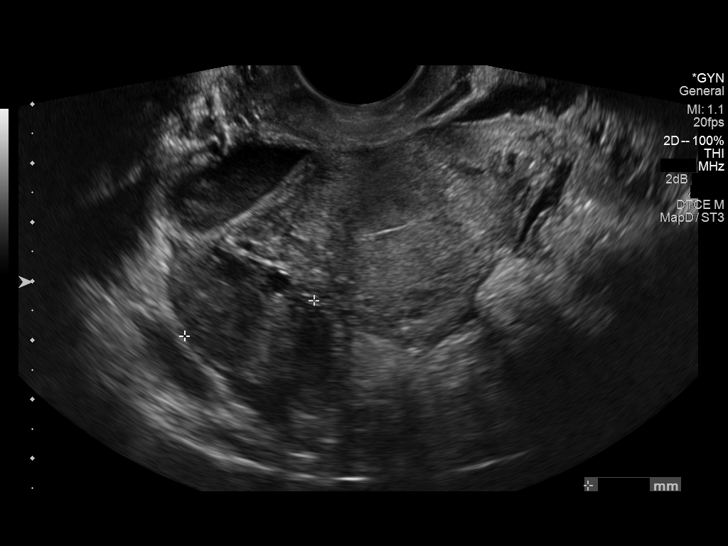
[im 30/55]
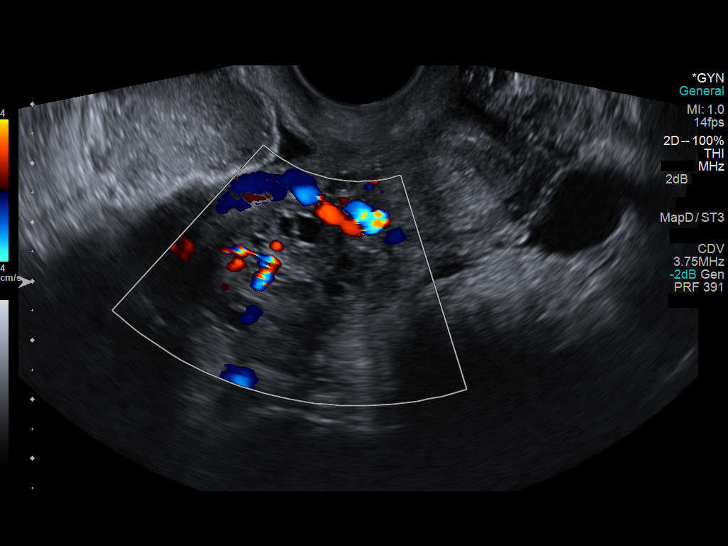
[im 34/55]
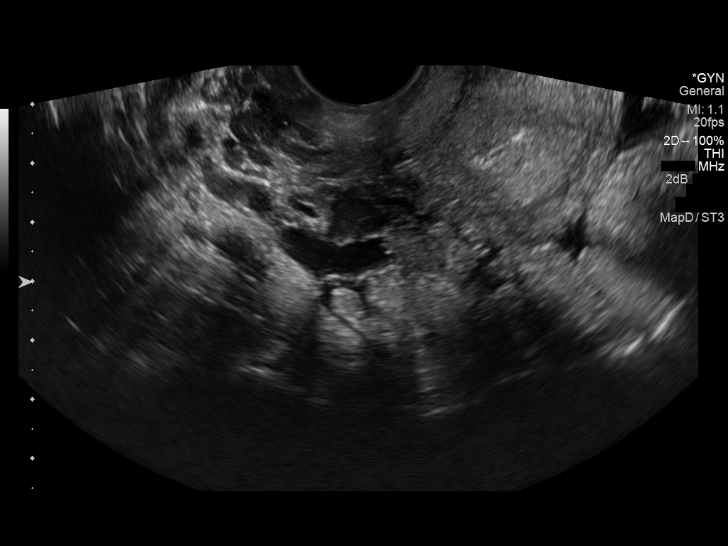
[im 37/55]
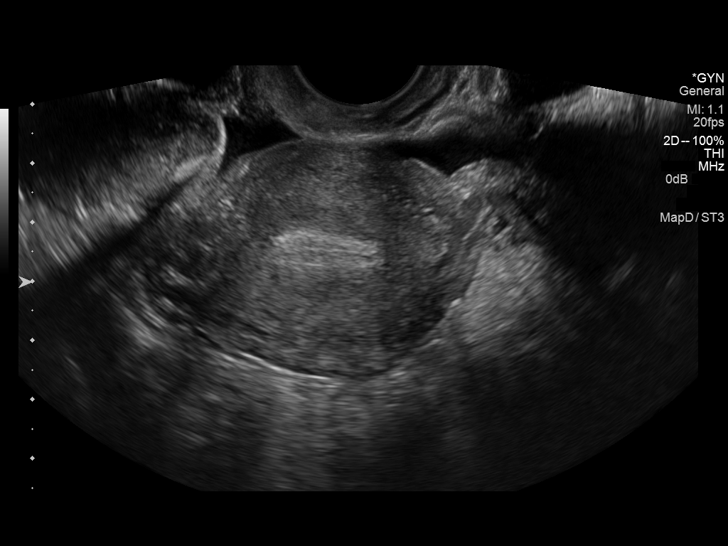
[im 41/55]
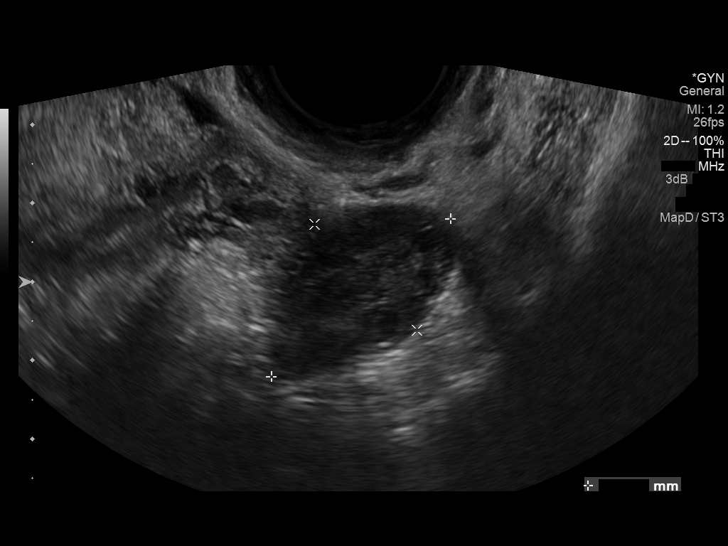
[im 46/55]
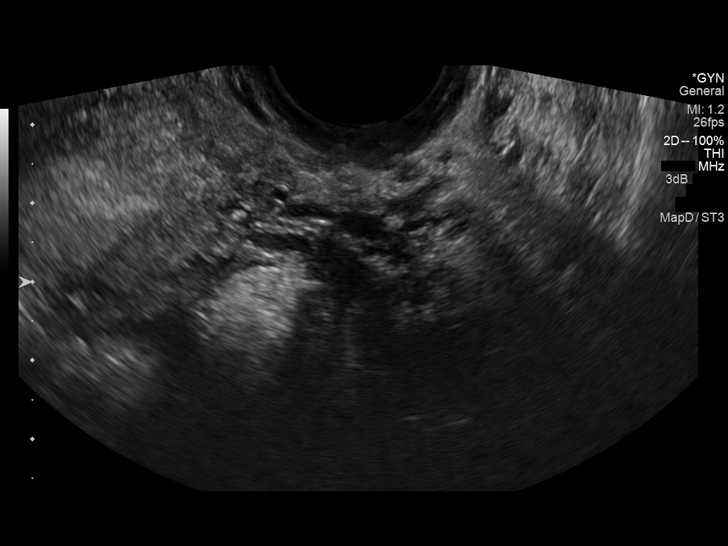
[im 50/55]
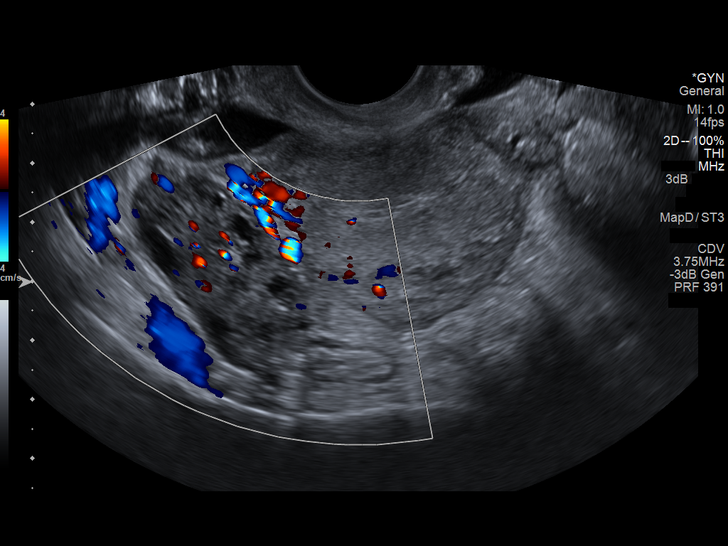
[im 55/55]
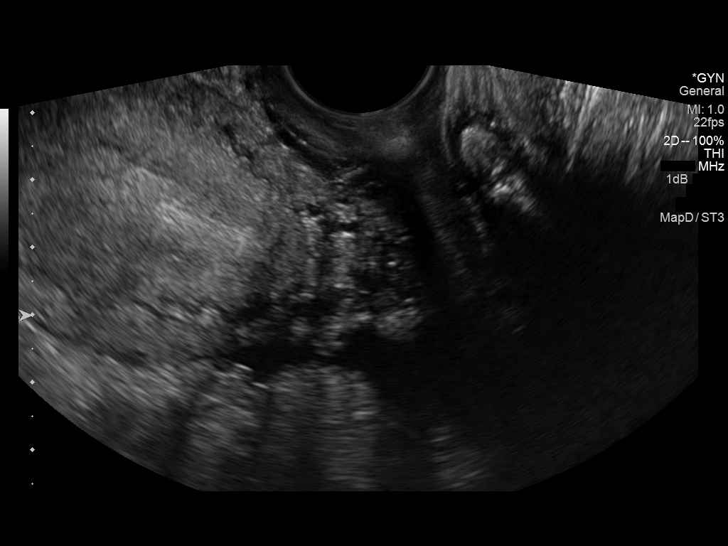

[14 of 25 positions shown; findings below may reference images not displayed]

FINDINGS: Uterus

Measurements: 7.3 x 4.7 x 3.8 cm. Anteverted, anteflexed. No
fibroids or other mass visualized.

Endometrium

Thickness: 7 mm, borderline trilaminar in appearance without focal
abnormality.

Right ovary

Measurements: 3.2 x 2.7 x 2.3 cm. Normal appearance/no adnexal mass.

Left ovary

Measurements: 3.0 x 2.5 x 1.9 cm. Normal appearance/no adnexal mass.

Other findings

Small free fluid in the cul de sac
IMPRESSION: Normal exam.

## 2015-12-12 IMAGING — US US RENAL
1 series · 14 of 25 positions shown · non-contrast
Comparison: None.

CLINICAL DATA: CVA tenderness.  Left lower quadrant pain.

EXAM:
RENAL/URINARY TRACT ULTRASOUND COMPLETE

[Series 1: us renal · 0.20mm/px · 14 of 30 slices shown]
[im 1/30]
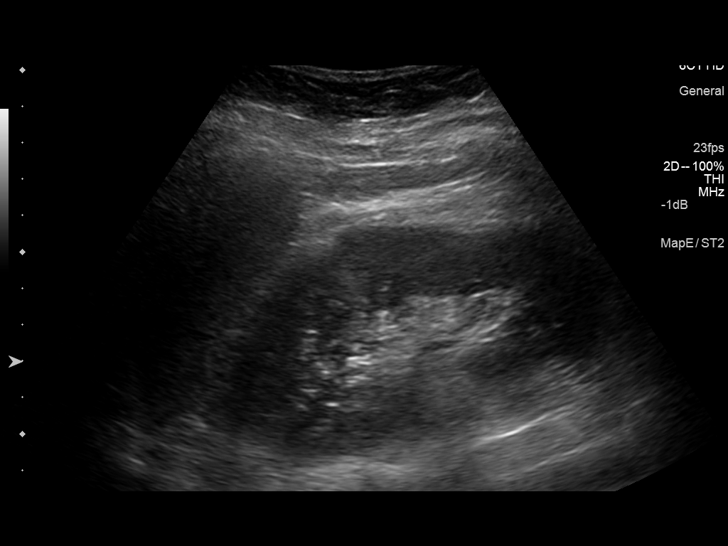
[im 3/30]
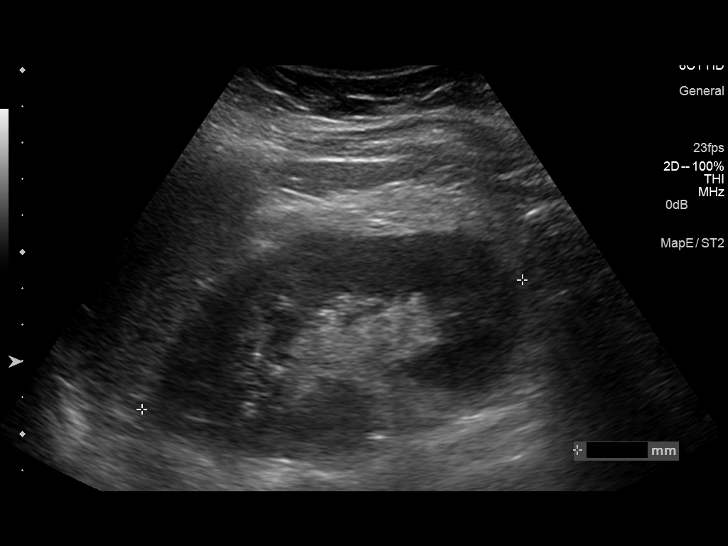
[im 5/30]
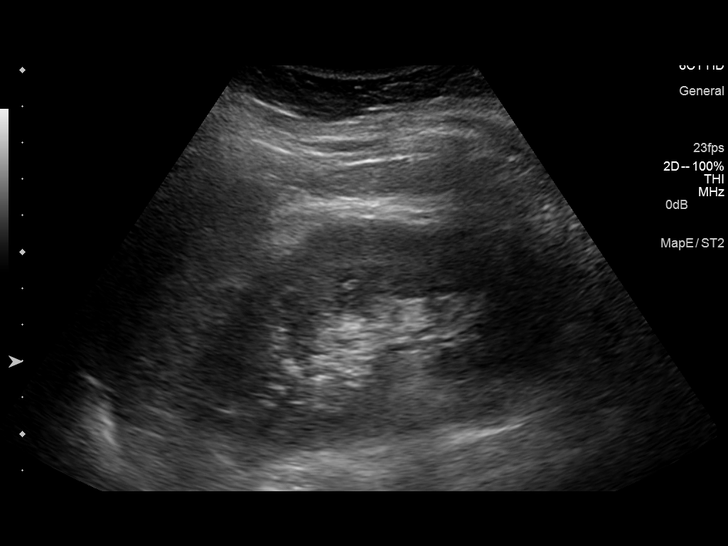
[im 8/30]
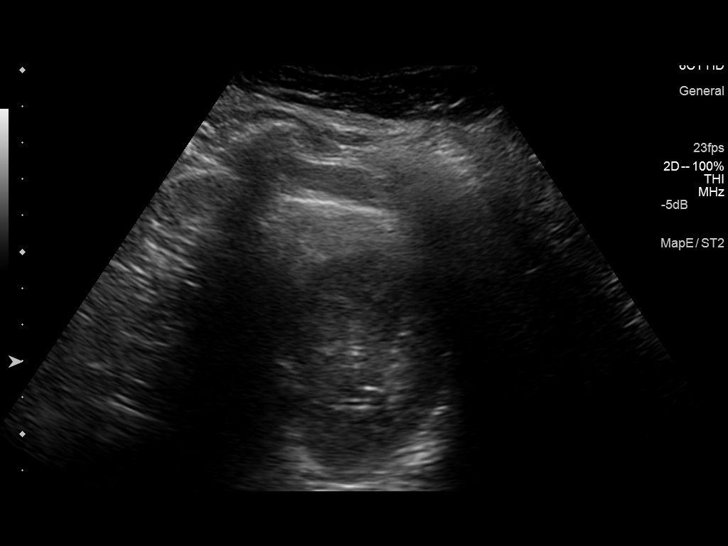
[im 10/30]
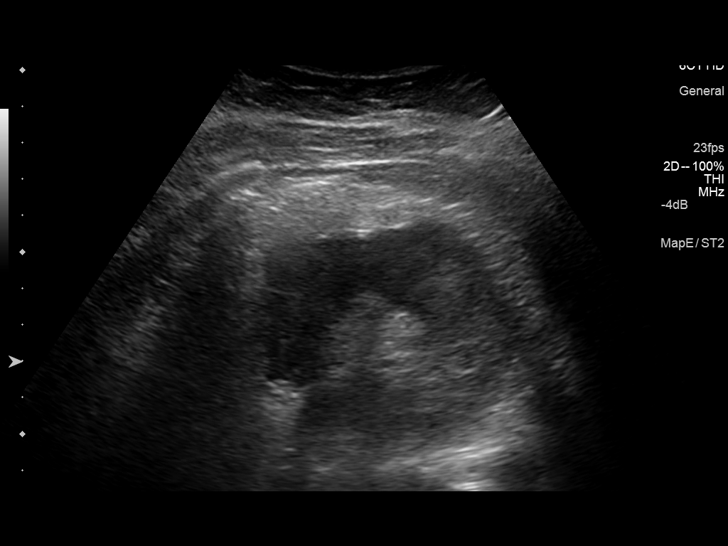
[im 11/30]
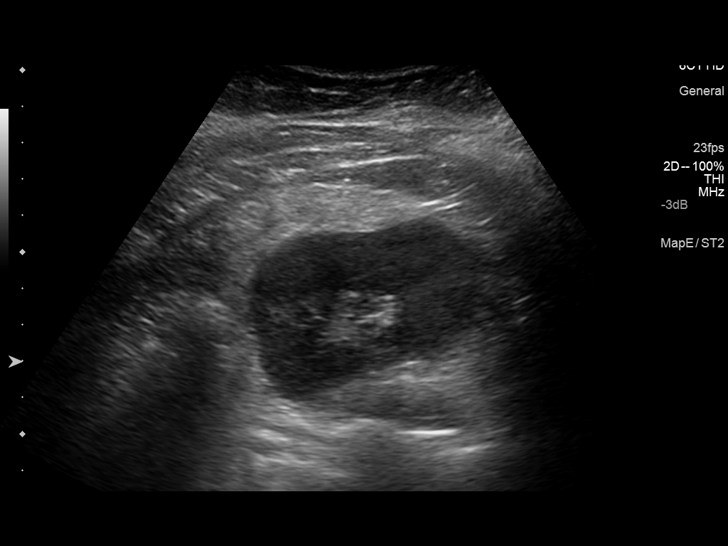
[im 14/30]
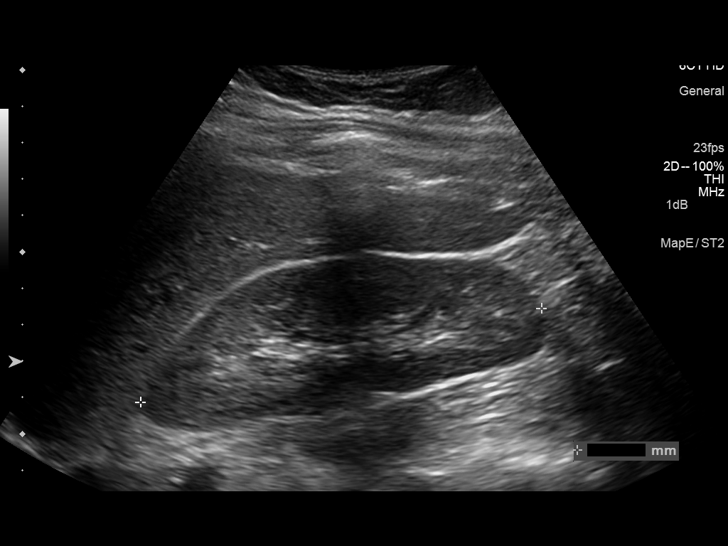
[im 16/30]
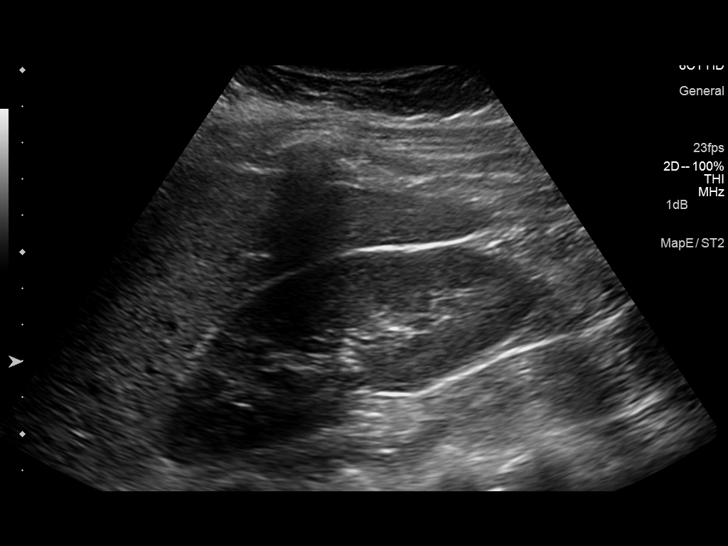
[im 19/30]
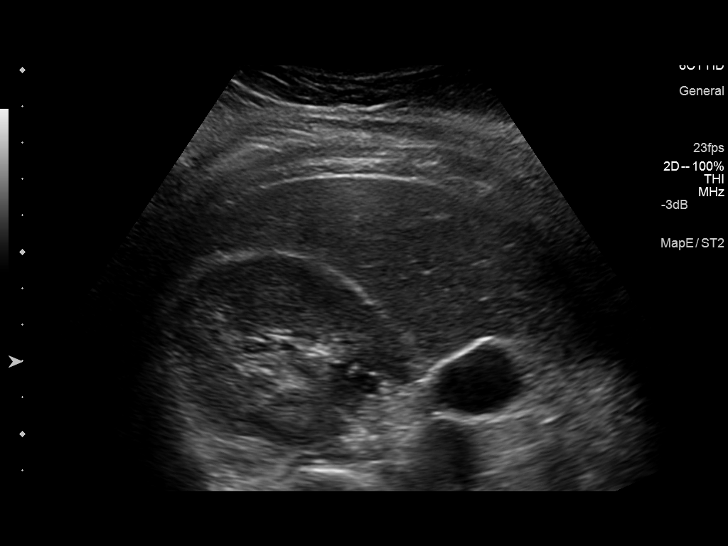
[im 20/30]
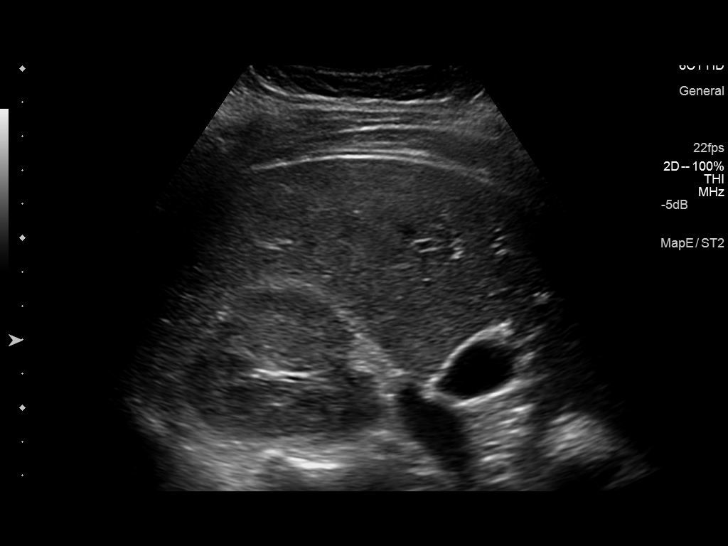
[im 22/30]
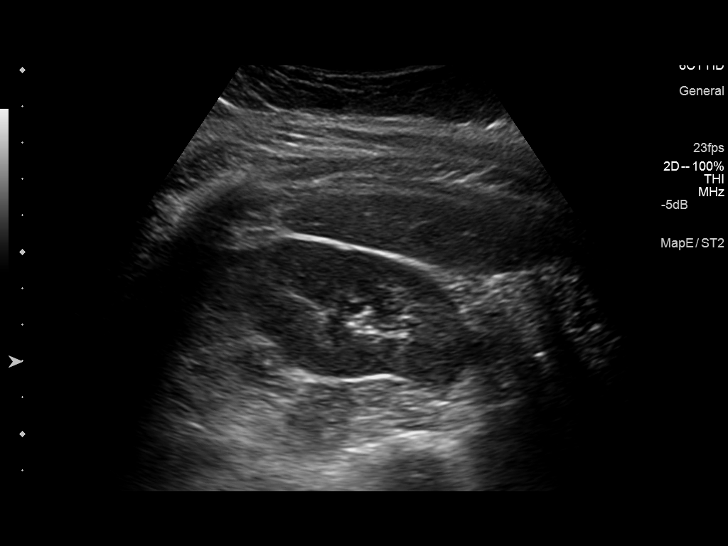
[im 25/30]
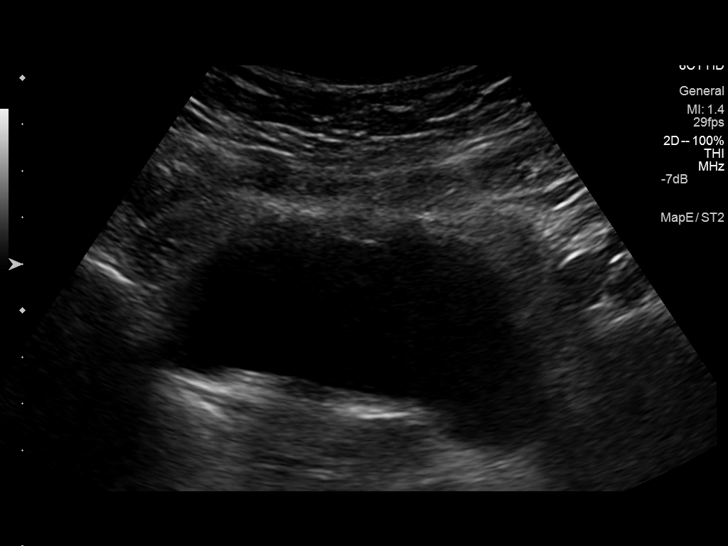
[im 27/30]
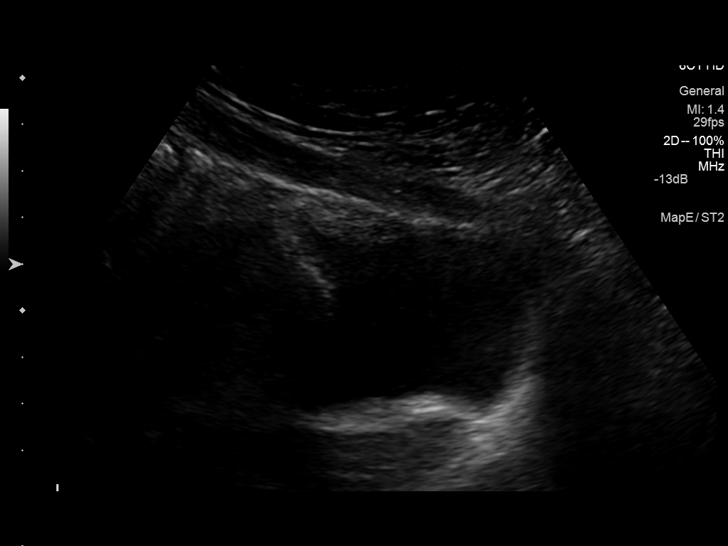
[im 30/30]
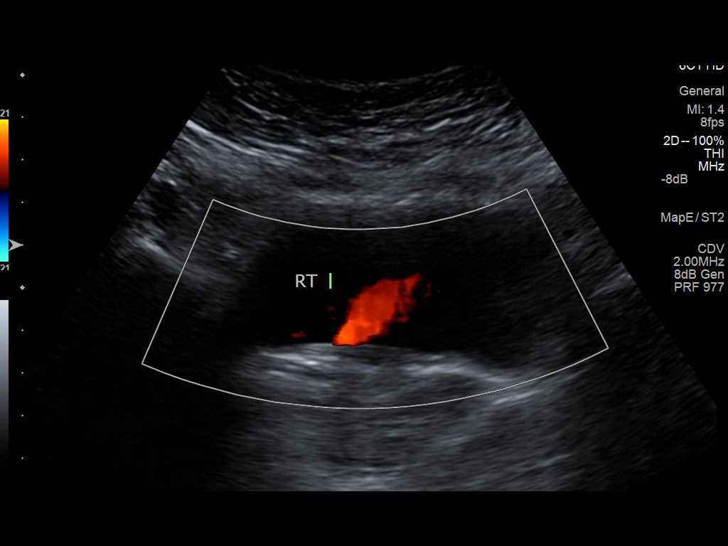

[14 of 25 positions shown; findings below may reference images not displayed]

FINDINGS: Right Kidney:

Length: 11 cm. Echogenicity within normal limits. No mass or
hydronephrosis visualized. No evidence of abscess.

Left Kidney:

Length: 11 cm. Echogenicity within normal limits. No mass or
hydronephrosis visualized. No evidence of abscess.

Bladder:

Appears normal for degree of bladder distention.
IMPRESSION: Normal renal ultrasound.

## 2016-04-26 ENCOUNTER — Ambulatory Visit: Payer: Medicaid Other | Admitting: Family Medicine

## 2016-04-27 ENCOUNTER — Other Ambulatory Visit (HOSPITAL_COMMUNITY)
Admission: RE | Admit: 2016-04-27 | Discharge: 2016-04-27 | Disposition: A | Discharge status: Home / Self Care | Payer: Medicaid Other | Source: Ambulatory Visit | Attending: Family Medicine | Admitting: Family Medicine

## 2016-04-27 ENCOUNTER — Encounter: Payer: Self-pay | Admitting: Family Medicine

## 2016-04-27 ENCOUNTER — Ambulatory Visit (INDEPENDENT_AMBULATORY_CARE_PROVIDER_SITE_OTHER): Payer: Medicaid Other | Admitting: Family Medicine

## 2016-04-27 VITALS — BP 120/59 | HR 83 | Temp 97.8°F | Wt 154.0 lb

## 2016-04-27 DIAGNOSIS — Z3403 Encounter for supervision of normal first pregnancy, third trimester: Secondary | ICD-10-CM | POA: Insufficient documentation

## 2016-04-27 DIAGNOSIS — N898 Other specified noninflammatory disorders of vagina: Secondary | ICD-10-CM

## 2016-04-27 DIAGNOSIS — Z23 Encounter for immunization: Secondary | ICD-10-CM | POA: Diagnosis present

## 2016-04-27 DIAGNOSIS — N760 Acute vaginitis: Secondary | ICD-10-CM | POA: Diagnosis not present

## 2016-04-27 DIAGNOSIS — Z3481 Encounter for supervision of other normal pregnancy, first trimester: Secondary | ICD-10-CM | POA: Diagnosis not present

## 2016-04-27 DIAGNOSIS — N912 Amenorrhea, unspecified: Secondary | ICD-10-CM

## 2016-04-27 DIAGNOSIS — Z113 Encounter for screening for infections with a predominantly sexual mode of transmission: Secondary | ICD-10-CM | POA: Insufficient documentation

## 2016-04-27 DIAGNOSIS — B9689 Other specified bacterial agents as the cause of diseases classified elsewhere: Secondary | ICD-10-CM | POA: Diagnosis not present

## 2016-04-27 DIAGNOSIS — Z3490 Encounter for supervision of normal pregnancy, unspecified, unspecified trimester: Secondary | ICD-10-CM | POA: Diagnosis present

## 2016-04-27 LAB — POCT WET PREP (WET MOUNT)
Clue Cells Wet Prep Whiff POC: POSITIVE
Trichomonas Wet Prep HPF POC: ABSENT

## 2016-04-27 LAB — POCT URINALYSIS DIPSTICK
Bilirubin, UA: NEGATIVE
Blood, UA: NEGATIVE
Glucose, UA: NEGATIVE
Ketones, UA: NEGATIVE
Leukocytes, UA: NEGATIVE
Nitrite, UA: NEGATIVE
Protein, UA: NEGATIVE
Spec Grav, UA: 1.025
Urobilinogen, UA: 2
pH, UA: 7

## 2016-04-27 LAB — POCT URINE PREGNANCY: PREG TEST UR: POSITIVE — AB

## 2016-04-27 MED ORDER — METRONIDAZOLE 500 MG PO TABS: 500.0000 mg | ORAL_TABLET | Freq: Two times a day (BID) | ORAL | 0 refills | Status: DC

## 2016-04-27 MED ORDER — PRE-NATAL FORMULA PO TABS: 1.0000 | ORAL_TABLET | Freq: Every day | ORAL | 11 refills | Status: DC

## 2016-04-27 NOTE — Assessment & Plan Note (Addendum)
Urine pregnancy positive today in clinic. Patient's LMP is somewhere between October 1 and 03/06/2016. She has no high risk features such as a history of preeclampsia, preterm delivery, or gestational diabetes.  She has cannabis use and tobacco abuse on her problem list, however denies use currently. Discussed importance of abstaining from alcohol, tobacco, and drugs. Initial prenatal labs obtained today. Patient advised to make an initial OB appointment with our clinic, at which time she can discuss with the provider whether to obtain a ultrasound for dating. Prescribed prenatal vitamins Discussed the importance of a balanced diet and discussed foods and medications to avoid.

## 2016-04-27 NOTE — Patient Instructions (Signed)
Congratulations on the pregnancy. Schedule a follow-up appointment for an initial prenatal visit. I will call you about the results of the lab work needed today. I sent a prescription for prenatal vitamins.   First Trimester of Pregnancy The first trimester of pregnancy is from week 1 until the end of week 12 (months 1 through 3). A week after a sperm fertilizes an egg, the egg will implant on the wall of the uterus. This embryo will begin to develop into a baby. Genes from you and your partner are forming the baby. The female genes determine whether the baby is a boy or a girl. At 6-8 weeks, the eyes and face are formed, and the heartbeat can be seen on ultrasound. At the end of 12 weeks, all the baby's organs are formed.  Now that you are pregnant, you will want to do everything you can to have a healthy baby. Two of the most important things are to get good prenatal care and to follow your health care provider's instructions. Prenatal care is all the medical care you receive before the baby's birth. This care will help prevent, find, and treat any problems during the pregnancy and childbirth. BODY CHANGES Your body goes through many changes during pregnancy. The changes vary from woman to woman.   You may gain or lose a couple of pounds at first.  You may feel sick to your stomach (nauseous) and throw up (vomit). If the vomiting is uncontrollable, call your health care provider.  You may tire easily.  You may develop headaches that can be relieved by medicines approved by your health care provider.  You may urinate more often. Painful urination may mean you have a bladder infection.  You may develop heartburn as a result of your pregnancy.  You may develop constipation because certain hormones are causing the muscles that push waste through your intestines to slow down.  You may develop hemorrhoids or swollen, bulging veins (varicose veins).  Your breasts may begin to grow larger and  become tender. Your nipples may stick out more, and the tissue that surrounds them (areola) may become darker.  Your gums may bleed and may be sensitive to brushing and flossing.  Dark spots or blotches (chloasma, mask of pregnancy) may develop on your face. This will likely fade after the baby is born.  Your menstrual periods will stop.  You may have a loss of appetite.  You may develop cravings for certain kinds of food.  You may have changes in your emotions from day to day, such as being excited to be pregnant or being concerned that something may go wrong with the pregnancy and baby.  You may have more vivid and strange dreams.  You may have changes in your hair. These can include thickening of your hair, rapid growth, and changes in texture. Some women also have hair loss during or after pregnancy, or hair that feels dry or thin. Your hair will most likely return to normal after your baby is born. WHAT TO EXPECT AT YOUR PRENATAL VISITS During a routine prenatal visit:  You will be weighed to make sure you and the baby are growing normally.  Your blood pressure will be taken.  Your abdomen will be measured to track your baby's growth.  The fetal heartbeat will be listened to starting around week 10 or 12 of your pregnancy.  Test results from any previous visits will be discussed. Your health care provider may ask you:  How you are  feeling.  If you are feeling the baby move.  If you have had any abnormal symptoms, such as leaking fluid, bleeding, severe headaches, or abdominal cramping.  If you are using any tobacco products, including cigarettes, chewing tobacco, and electronic cigarettes.  If you have any questions. Other tests that may be performed during your first trimester include:  Blood tests to find your blood type and to check for the presence of any previous infections. They will also be used to check for low iron levels (anemia) and Rh antibodies. Later in  the pregnancy, blood tests for diabetes will be done along with other tests if problems develop.  Urine tests to check for infections, diabetes, or protein in the urine.  An ultrasound to confirm the proper growth and development of the baby.  An amniocentesis to check for possible genetic problems.  Fetal screens for spina bifida and Down syndrome.  You may need other tests to make sure you and the baby are doing well.  HIV (human immunodeficiency virus) testing. Routine prenatal testing includes screening for HIV, unless you choose not to have this test. HOME CARE INSTRUCTIONS  Medicines   Follow your health care provider's instructions regarding medicine use. Specific medicines may be either safe or unsafe to take during pregnancy.  Take your prenatal vitamins as directed.  If you develop constipation, try taking a stool softener if your health care provider approves. Diet   Eat regular, well-balanced meals. Choose a variety of foods, such as meat or vegetable-based protein, fish, milk and low-fat dairy products, vegetables, fruits, and whole grain breads and cereals. Your health care provider will help you determine the amount of weight gain that is right for you.  Avoid raw meat and uncooked cheese. These carry germs that can cause birth defects in the baby.  Eating four or five small meals rather than three large meals a day may help relieve nausea and vomiting. If you start to feel nauseous, eating a few soda crackers can be helpful. Drinking liquids between meals instead of during meals also seems to help nausea and vomiting.  If you develop constipation, eat more high-fiber foods, such as fresh vegetables or fruit and whole grains. Drink enough fluids to keep your urine clear or pale yellow. Activity and Exercise   Exercise only as directed by your health care provider. Exercising will help you:  Control your weight.  Stay in shape.  Be prepared for labor and  delivery.  Experiencing pain or cramping in the lower abdomen or low back is a good sign that you should stop exercising. Check with your health care provider before continuing normal exercises.  Try to avoid standing for long periods of time. Move your legs often if you must stand in one place for a long time.  Avoid heavy lifting.  Wear low-heeled shoes, and practice good posture.  You may continue to have sex unless your health care provider directs you otherwise. Relief of Pain or Discomfort   Wear a good support bra for breast tenderness.   Take warm sitz baths to soothe any pain or discomfort caused by hemorrhoids. Use hemorrhoid cream if your health care provider approves.   Rest with your legs elevated if you have leg cramps or low back pain.  If you develop varicose veins in your legs, wear support hose. Elevate your feet for 15 minutes, 3-4 times a day. Limit salt in your diet. Prenatal Care   Schedule your prenatal visits by the twelfth week  of pregnancy. They are usually scheduled monthly at first, then more often in the last 2 months before delivery.  Write down your questions. Take them to your prenatal visits.  Keep all your prenatal visits as directed by your health care provider. Safety   Wear your seat belt at all times when driving.  Make a list of emergency phone numbers, including numbers for family, friends, the hospital, and police and fire departments. General Tips   Ask your health care provider for a referral to a local prenatal education class. Begin classes no later than at the beginning of month 6 of your pregnancy.  Ask for help if you have counseling or nutritional needs during pregnancy. Your health care provider can offer advice or refer you to specialists for help with various needs.  Do not use hot tubs, steam rooms, or saunas.  Do not douche or use tampons or scented sanitary pads.  Do not cross your legs for long periods of  time.  Avoid cat litter boxes and soil used by cats. These carry germs that can cause birth defects in the baby and possibly loss of the fetus by miscarriage or stillbirth.  Avoid all smoking, herbs, alcohol, and medicines not prescribed by your health care provider. Chemicals in these affect the formation and growth of the baby.  Do not use any tobacco products, including cigarettes, chewing tobacco, and electronic cigarettes. If you need help quitting, ask your health care provider. You may receive counseling support and other resources to help you quit.  Schedule a dentist appointment. At home, brush your teeth with a soft toothbrush and be gentle when you floss. SEEK MEDICAL CARE IF:   You have dizziness.  You have mild pelvic cramps, pelvic pressure, or nagging pain in the abdominal area.  You have persistent nausea, vomiting, or diarrhea.  You have a bad smelling vaginal discharge.  You have pain with urination.  You notice increased swelling in your face, hands, legs, or ankles. SEEK IMMEDIATE MEDICAL CARE IF:   You have a fever.  You are leaking fluid from your vagina.  You have spotting or bleeding from your vagina.  You have severe abdominal cramping or pain.  You have rapid weight gain or loss.  You vomit blood or material that looks like coffee grounds.  You are exposed to [place] measles and have never had them.  You are exposed to fifth disease or chickenpox.  You develop a severe headache.  You have shortness of breath.  You have any kind of trauma, such as from a fall or a car accident. This information is not intended to replace advice given to you by your health care provider. Make sure you discuss any questions you have with your health care provider. Document Released: 05/08/2001 Document Revised: 06/04/2014 Document Reviewed: 03/24/2013 Elsevier Interactive Patient Education  2017 [employer].

## 2016-04-27 NOTE — Assessment & Plan Note (Addendum)
Exam and history consistent with BV.  -- Flagyl 500mg  BID x 7 days. Discussed disulfiram reaction.

## 2016-04-27 NOTE — Progress Notes (Signed)
Subjective: CC: pregnancy test HPI: Patient is a 25 y.o. female with a past medical history of PTD, tobacco abuse, THC use presenting to clinic today for a pregnancy test and vaginal discharge.   VAGINAL DISCHARGE Having vaginal discharge for 3 weeks 3 days ago it started to be malodorous Medications tried: no Discharge consistency: creamy Discharge color: white/tan Recent antibiotic use: no  Sex in last month: no  Possible STD exposure: yes  Symptoms Fever: no  Dysuria:no Vaginal bleeding:  no Abdomen or Pelvic pain: no  Back pain: no  Genital sores or ulcers:no Rash: no  Pain during sex: no  Missed menstrual period: yes  Pregnancy: Patient concerned about pregnancy. She states that she had a positive home pregnancy test last week. LMP: 03/06/16. This is unsure, she believes it was between October 1 and October 10.  She's had 4 abortions in the past, 2 kids living (full term). No complications with previous pregnancies. She is denies hypertension, preeclampsia, gestational diabetes, preterm delivery. She states she is in a better position now than she was with her prior abortions. She was not planning this pregnancy, but is okay with it. The father of the baby is the father of her other 2 children and is currently involved. She's not currently on any medications. She has not been taking prenatal vitamins. She denies any alcohol use  Social History: no smoke (last smoked 3 months ago), denies THC use  Health Maintenance:  Due for flu vaccine, will get today.  ROS: All other systems reviewed and are negative.  Past Medical History Patient Active Problem List   Diagnosis Date Noted  . Pregnancy 04/27/2016  . Rash and nonspecific skin eruption 11/18/2014  . Blood poisoning (HCC)   . Complicated UTI (urinary tract infection)   . Leukocytosis   . Tachycardia   . PID (acute pelvic inflammatory disease) 03/31/2014  . Vaginal discharge 10/24/2012  . LBP (low back pain)  10/18/2011  . Bacterial vaginosis 10/16/2011  . Contraception 11/01/2010  . HIP PAIN, LEFT 02/22/2010  . 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 TOBACCO ABUSE 04/07/2009    Medications- reviewed and updated  Objective: Office vital signs reviewed. BP (!) 120/59   Pulse 83   Temp 97.8 F (36.6 C) (Oral)   Wt 154 lb (69.9 kg)   LMP 02/26/2016   BMI 27.28 kg/m    Physical Examination:  General: Awake, alert, well- nourished, NAD Cardio: RRR, no m/r/g noted.  Pulm: No increased WOB.  CTAB, without wheezes, rhonchi or crackles noted.  GI: soft, NT/ND,+BS x4, no hepatomegaly, no splenomegaly GYN: examined with chaperone.   External genitalia within normal limits.  Vaginal mucosa pink, moist, normal rugae.  Nonfriable cervix without lesions, No bleeding noted on speculum exam, moderate amount of malodorous white thin vaginal discharge.  Bimanual exam revealed normal uterus.  No cervical motion tenderness. No adnexal masses bilaterally.    Urine pregnancy test positive Wet prep positive for moderate clue cells and a positive whiff test. No trichomonas or yeast noted.  Assessment/Plan: Bacterial vaginosis Exam and history consistent with BV.  -- Flagyl 500mg  BID x 7 days. Discussed disulfiram reaction.   Pregnancy Urine pregnancy positive today in clinic. Patient's LMP is somewhere between October 1 and 03/06/2016. She has no high risk features such as a history of preeclampsia, preterm delivery, or gestational diabetes.  She has cannabis use and tobacco abuse on her problem list, however denies use currently. Discussed importance of abstaining from alcohol, tobacco,  and drugs. Initial prenatal labs obtained today. Patient advised to make an initial OB appointment with our clinic, at which time she can discuss with the provider whether to obtain a ultrasound for dating. Prescribed prenatal vitamins Discussed the importance of a balanced diet and discussed foods and medications to  avoid.   Orders Placed This Encounter  Procedures  . Culture, OB Urine  . Flu Vaccine QUAD 36+ mos IM  . Obstetric Panel  . HIV antibody (with reflex)  . POCT urine pregnancy  . POCT Wet Prep [employer]([place] [place])  . Urinalysis Dipstick    Meds ordered this encounter  Medications  . Prenatal Multivit-Min-Fe-FA (PRE-NATAL FORMULA) TABS    Sig: Take 1 tablet by mouth daily.    Dispense:  30 each    Refill:  11  . metroNIDAZOLE (FLAGYL) 500 MG tablet    Sig: Take 1 tablet (500 mg total) by mouth 2 (two) times daily.    Dispense:  14 tablet    Refill:  0    Joanna Puffrystal S. Saxon Barich PGY-3, [facility]

## 2016-04-28 LAB — HIV ANTIBODY (ROUTINE TESTING W REFLEX): HIV 1&2 Ab, 4th Generation: NONREACTIVE

## 2016-04-29 LAB — CULTURE, OB URINE

## 2016-04-30 ENCOUNTER — Telehealth: Payer: Self-pay | Admitting: Family Medicine

## 2016-04-30 DIAGNOSIS — Z3490 Encounter for supervision of normal pregnancy, unspecified, unspecified trimester: Secondary | ICD-10-CM

## 2016-04-30 LAB — OBSTETRIC PANEL
Antibody Screen: NEGATIVE
BASOS ABS: 0 {cells}/uL (ref 0–200)
Basophils Relative: 0 %
Eosinophils Absolute: 206 cells/uL (ref 15–500)
Eosinophils Relative: 2 %
HCT: 35.7 % (ref 35.0–45.0)
HEP B S AG: NEGATIVE
Hemoglobin: 11.4 g/dL — ABNORMAL LOW (ref 11.7–15.5)
LYMPHS ABS: 2369 {cells}/uL (ref 850–3900)
Lymphocytes Relative: 23 %
MCH: 28.1 pg (ref 27.0–33.0)
MCHC: 31.9 g/dL — AB (ref 32.0–36.0)
MCV: 87.9 fL (ref 80.0–100.0)
MONO ABS: 721 {cells}/uL (ref 200–950)
MPV: 10.3 fL (ref 7.5–12.5)
Monocytes Relative: 7 %
NEUTROS PCT: 68 %
Neutro Abs: 7004 cells/uL (ref 1500–7800)
PLATELETS: 266 10*3/uL (ref 140–400)
RBC: 4.06 MIL/uL (ref 3.80–5.10)
RDW: 13.6 % (ref 11.0–15.0)
RH TYPE: POSITIVE
Rubella: 1.32 Index — ABNORMAL HIGH (ref <0.90)
WBC: 10.3 10*3/uL (ref 3.8–10.8)

## 2016-04-30 LAB — CERVICOVAGINAL ANCILLARY ONLY
Chlamydia: POSITIVE — AB
Neisseria Gonorrhea: NEGATIVE

## 2016-04-30 NOTE — Telephone Encounter (Signed)
[facility], patient informed.

## 2016-04-30 NOTE — Telephone Encounter (Signed)
I noted that the patient has not yet had an initial prenatal visit scheduled. Will go ahead and order a dating ultrasound today.   Red team, will you please schedule this and let the patient know.    FYI to provider who sees her next: GC/chlamydia ordered and specimen colleted on 12/1, however as of 12/4, it is still labelled as "active" under labs. Please ensure this has been completed.    Thanks, Joanna Puffrystal S. Dorsey, MD [facility] Resident  04/30/2016, 9:29 AM

## 2016-05-01 ENCOUNTER — Ambulatory Visit (INDEPENDENT_AMBULATORY_CARE_PROVIDER_SITE_OTHER): Payer: Medicaid Other | Admitting: *Deleted

## 2016-05-01 ENCOUNTER — Telehealth: Payer: Self-pay | Admitting: Family Medicine

## 2016-05-01 DIAGNOSIS — A749 Chlamydial infection, unspecified: Secondary | ICD-10-CM | POA: Diagnosis present

## 2016-05-01 MED ORDER — AZITHROMYCIN 500 MG PO TABS
1000.0000 mg | ORAL_TABLET | Freq: Once | ORAL | Status: AC
  Administered 2016-05-01: 1000 mg via ORAL

## 2016-05-01 NOTE — Progress Notes (Signed)
   Patient in nurse clinic for chlamydia treatment.  Advised patient not to have sex for 7-10 days and to notify partner for testing/treatment.  Azithromycin 1 gm PO x 1 given per Dr. Leonides Schanzorsey.  Patient to call in 2-3 months for re- screening.  Clovis PuMartin, Nyela Cortinas L, RN

## 2016-05-01 NOTE — Telephone Encounter (Signed)
Called to inform the patient of positive chlamydia test result. The patient will come in for direct antibiotic therapy with azithromycin 1 g orally. I discussed the importance of making sure all of her partners were treated appropriate. I discussed abstaining from intercourse for at least 1 week after everyone was treated.  The patient was scheduled for a nurse's appointment today at 2:45.  Joanna Puffrystal S. Rainn Bullinger, MD [facility] Resident  05/01/2016, 10:21 AM

## 2016-05-10 ENCOUNTER — Ambulatory Visit (HOSPITAL_COMMUNITY)
Admission: RE | Admit: 2016-05-10 | Discharge: 2016-05-10 | Disposition: A | Discharge status: Home / Self Care | Payer: Medicaid Other | Source: Ambulatory Visit | Attending: Family Medicine | Admitting: Family Medicine

## 2016-05-10 DIAGNOSIS — Z3490 Encounter for supervision of normal pregnancy, unspecified, unspecified trimester: Secondary | ICD-10-CM

## 2016-05-10 DIAGNOSIS — Z3491 Encounter for supervision of normal pregnancy, unspecified, first trimester: Secondary | ICD-10-CM | POA: Insufficient documentation

## 2016-05-10 DIAGNOSIS — Z3A01 Less than 8 weeks gestation of pregnancy: Secondary | ICD-10-CM | POA: Insufficient documentation

## 2016-05-24 ENCOUNTER — Encounter: Payer: Medicaid Other | Admitting: Obstetrics and Gynecology

## 2016-05-24 NOTE — Progress Notes (Deleted)
Subjective:    Shannon Randolph is being seen today for her first obstetrical visit.  This {is/is not:9024} a planned pregnancy. She is at Unknown gestation. Her obstetrical history is significant for {ob risk factors:10154}. Relationship with FOB: {fob:16621}. Patient {does/does not:19097} intend to breast feed. Pregnancy history fully reviewed.  Patient reports {sx:14538}.  Past Medical History:  Diagnosis Date  . Bacterial vaginosis   . Infection    UTI, gonorrhea  . No pertinent past medical history    Past Surgical History:  Procedure Laterality Date  . NO PAST SURGERIES     Current Outpatient Prescriptions on File Prior to Visit  Medication Sig Dispense Refill  . metroNIDAZOLE (FLAGYL) 500 MG tablet Take 1 tablet (500 mg total) by mouth 2 (two) times daily. 14 tablet 0  . Prenatal Multivit-Min-Fe-FA (PRE-NATAL FORMULA) TABS Take 1 tablet by mouth daily. 30 each 11  . triamcinolone ointment (KENALOG) 0.1 % Apply 1 application topically 2 (two) times daily. 80 g 0   No current facility-administered medications on file prior to visit.    No Known Allergies  OB History    Gravida Para Term Preterm AB Living   3 2 2     2    SAB TAB Ectopic Multiple Live Births           2     GYN Hx -  Social History   Social History  . Marital status: Single    Spouse name: N/A  . Number of children: N/A  . Years of education: N/A   Occupational History  . Not on file.   Social History Main Topics  . Smoking status: Former Smoker    Quit date: 01/11/2012  . Smokeless tobacco: Former NeurosurgeonUser    Quit date: 01/11/2012  . Alcohol use No  . Drug use: No  . Sexual activity: Yes    Birth control/ protection: None   Other Topics Concern  . Not on file   Social History Narrative  . No narrative on file     Review of Systems:   Review of Systems  Objective:     LMP 02/26/2016  Physical Exam  Exam    Assessment:    Pregnancy: [id number] Patient Active Problem List   Diagnosis Date Noted  . Encounter for supervision of other normal pregnancy 04/27/2016  . Rash and nonspecific skin eruption 11/18/2014  . Blood poisoning (HCC)   . Complicated UTI (urinary tract infection)   . Leukocytosis   . Tachycardia   . PID (acute pelvic inflammatory disease) 03/31/2014  . Vaginal discharge 10/24/2012  . LBP (low back pain) 10/18/2011  . Bacterial vaginosis 10/16/2011  . Contraception 11/01/2010  . HIP PAIN, LEFT 02/22/2010  . 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 TOBACCO ABUSE 04/07/2009       Plan:     Initial labs drawn. Prenatal vitamins. Problem list reviewed and updated. AFP3 discussed: {requests/ordered/declines:14581}. Role of ultrasound in pregnancy discussed; fetal survey: {requests/ordered/declines:14581}. Amniocentesis discussed: {amniocentesis:14582}. Follow up in {numbers 0-4:31231} weeks. ***% of *** min visit spent on counseling and coordination of care.  ***   Prenatal exam: Gen: Well nourished, well developed.  No distress.  Vitals noted. HEENT: Normocephalic, atraumatic.  Neck supple without cervical lymphadenopathy, thyromegaly or thyroid nodules.  fair dentition. CV: RRR no murmur, gallops or rubs Lungs: CTA B.  Normal respiratory effort without wheezes or rales. Abd: soft, NTND. +BS.  Uterus not appreciated above pelvis. GU: Normal external female genitalia without lesions.  Nl  vaginal, well rugated without lesions. No vaginal discharge.  Bimanual exam: No adnexal mass or TTP. No CMT.  Uterus size *** Ext: No clubbing, cyanosis or edema. Psych: Normal grooming and dress.  Not depressed or anxious appearing.  Normal thought content and process without flight of ideas or looseness of associations    Assessment/plan: 1) Pregnancy Unknown doing well.  Current pregnancy issues include *** Dating {is/is not:9024} reliable Prenatal labs reviewed, notable for ***. Bleeding and pain precautions reviewed. Importance of prenatal  vitamins reviewed.  Genetic screening offered.  Early glucola {is/is [id number]:19887} indicated.    Follow up 4 weeks.            Caryl AdaJazma Phelps, DO 05/24/2016, 7:15 AM PGY-3, [facility]

## 2016-05-28 NOTE — L&D Delivery Note (Signed)
26 y.o. [id number] at 4343w2d admitted for SOL delivered a viable female infant at 0157 in cephalic, ROA position. No nuchal cord. Left anterior shoulder delivered with ease. 60 sec delayed cord clamping. Cord clamped x2 and cut. Placenta delivered spontaneously intact, with 3VC. Fundus firm on exam with massage and pitocin. Good hemostasis noted.  Anesthesia: Epidural Laceration: None Good hemostasis noted. EBL: 50 cc  Mom and baby recovering in LDR.    Apgars: APGAR (1 MIN):  8 APGAR (5 MINS): 9 Weight: Pending skin to skin  Sponge and instrument count were correct x2. Placenta sent to L&D. Outpatient circ, bottle feeding, Depo for contraception.  [place] Shirley, DO [facility] Resident PGY-1 12/27/2016 2:23 AM  Patient is a [id number] at 7643w2d who was admitted with SOL, uncomplicated prenatal course.  She progressed without augmentation.  I was gloved and present for delivery in its entirety.  Second stage of labor progressed to SVD.  Mild decels during second stage noted.  Complications: none  Lacerations: none  EBL: 50cc (update: after getting up to bathroom- around 400cc in total)  Pt seemed disinterested in baby. He was lying on her chest and her arms remained at her sides. Her labor wasn't lengthy so it doesn't appear that her reaction is due to exhaustion. Will have SW eval.  Breken Nazari, CNM 3:07 AM 12/27/2016

## 2016-06-04 ENCOUNTER — Ambulatory Visit (INDEPENDENT_AMBULATORY_CARE_PROVIDER_SITE_OTHER): Payer: Medicaid Other | Admitting: Obstetrics and Gynecology

## 2016-06-04 ENCOUNTER — Other Ambulatory Visit (HOSPITAL_COMMUNITY)
Admission: RE | Admit: 2016-06-04 | Discharge: 2016-06-04 | Disposition: A | Discharge status: Home / Self Care | Payer: Medicaid Other | Source: Ambulatory Visit | Attending: Family Medicine | Admitting: Family Medicine

## 2016-06-04 ENCOUNTER — Encounter: Payer: Self-pay | Admitting: Obstetrics and Gynecology

## 2016-06-04 VITALS — BP 100/80 | HR 102 | Temp 97.7°F | Wt 151.0 lb

## 2016-06-04 DIAGNOSIS — Z124 Encounter for screening for malignant neoplasm of cervix: Secondary | ICD-10-CM

## 2016-06-04 DIAGNOSIS — Z1151 Encounter for screening for human papillomavirus (HPV): Secondary | ICD-10-CM | POA: Insufficient documentation

## 2016-06-04 DIAGNOSIS — Z113 Encounter for screening for infections with a predominantly sexual mode of transmission: Secondary | ICD-10-CM | POA: Diagnosis present

## 2016-06-04 DIAGNOSIS — O219 Vomiting of pregnancy, unspecified: Secondary | ICD-10-CM

## 2016-06-04 DIAGNOSIS — Z3481 Encounter for supervision of other normal pregnancy, first trimester: Secondary | ICD-10-CM

## 2016-06-04 DIAGNOSIS — N898 Other specified noninflammatory disorders of vagina: Secondary | ICD-10-CM

## 2016-06-04 DIAGNOSIS — Z01411 Encounter for gynecological examination (general) (routine) with abnormal findings: Secondary | ICD-10-CM | POA: Diagnosis present

## 2016-06-04 LAB — POCT WET PREP (WET MOUNT)
Clue Cells Wet Prep Whiff POC: POSITIVE
TRICHOMONAS WET PREP HPF POC: ABSENT

## 2016-06-04 MED ORDER — PRE-NATAL FORMULA PO TABS: 1.0000 | ORAL_TABLET | Freq: Every day | ORAL | 11 refills | Status: DC

## 2016-06-04 MED ORDER — ONDANSETRON 4 MG PO TBDP: 4.0000 mg | ORAL_TABLET | Freq: Three times a day (TID) | ORAL | 0 refills | Status: DC | PRN

## 2016-06-04 MED ORDER — METRONIDAZOLE 500 MG PO TABS: 500.0000 mg | ORAL_TABLET | Freq: Two times a day (BID) | ORAL | 0 refills | Status: DC

## 2016-06-04 NOTE — Assessment & Plan Note (Signed)
Vaginal discharge noted on exam. Patient with previous BV a month ago but never picked up RX to treat. Wet prep done today +BV. Rx for flagyl given. Will also check for STDs since patient with recent +Chylamdia test. Discussed at length high risk sexual behavior and implications to unborn fetus of ongoing infection.

## 2016-06-04 NOTE — Progress Notes (Signed)
Subjective:    Shannon SlickerRashanda Mynhier is a 26 y.o. [phone] female being seen today for her first obstetrical visit.  This is not a planned pregnancy. She is at 4741w6d by early US. Her obstetrical history is not significant. No complications with previous pregnancies. She is denies hypertension, preeclampsia, gestational diabetes, preterm delivery. Relationship with FOB: significant other, living together. Patient does not intend to breast feed. Pregnancy history fully reviewed.  She is taking prenatal vitamins. She denies any alcohol use.  Patient reports fatigue, nausea and vomiting. Was prescribed Zofran which is helping some.    Past Medical History:  Diagnosis Date  . Bacterial vaginosis   . Infection    UTI, gonorrhea  . No pertinent past medical history    Past Surgical History:  Procedure Laterality Date  . NO PAST SURGERIES     Current Outpatient Prescriptions on File Prior to Visit  Medication Sig Dispense Refill  . metroNIDAZOLE (FLAGYL) 500 MG tablet Take 1 tablet (500 mg total) by mouth 2 (two) times daily. 14 tablet 0  . triamcinolone ointment (KENALOG) 0.1 % Apply 1 application topically 2 (two) times daily. 80 g 0   No current facility-administered medications on file prior to visit.    OB History    Gravida Para Term Preterm AB Living   7 2 2   4 2    SAB TAB Ectopic Multiple Live Births           2     GYN hx- no abnormal paps  No Known Allergies  Social History   Social History  . Marital status: Single    Spouse name: N/A  . Number of children: N/A  . Years of education: N/A   Occupational History  . Not on file.   Social History Main Topics  . Smoking status: Former Smoker    Quit date: 01/11/2012  . Smokeless tobacco: Former NeurosurgeonUser    Quit date: 01/11/2012  . Alcohol use No  . Drug use: No  . Sexual activity: Yes    Birth control/ protection: None   Other Topics Concern  . Not on file   Social History Narrative  . No narrative on file     Review of Systems:   Review of Systems  Constitutional: Negative for fever.  Respiratory: Negative for shortness of breath.   Cardiovascular: Negative for chest pain.  Gastrointestinal: Positive for nausea and vomiting. Negative for abdominal pain.  Genitourinary: Negative for vaginal discharge.  Also per HPI  Objective:    BP 100/80   Pulse (!) 102   Temp 97.7 F (36.5 C)   Wt 151 lb (68.5 kg)   LMP 02/26/2016   BMI 26.75 kg/m  Physical Exam  Exam  Gen: Well nourished, well developed.  No distress.  Vitals noted. HEENT: Normocephalic, atraumatic.  Neck supple without cervical lymphadenopathy, thyromegaly or thyroid nodules. Fair dentition. CV: RRR no murmur, gallops or rubs Lungs: CTA B.  Normal respiratory effort without wheezes or rales. Abd: soft, NTND. +BS.  Uterus not appreciated above pelvis. GU: Normal external female genitalia without lesions.  Nl vaginal, well rugated without lesions. + vaginal discharge.  Bimanual exam: No adnexal mass or TTP. No CMT. Ext: No clubbing, cyanosis or edema. Psych: Normal grooming and dress.  Mood seems depressed. Normal thought content and process  Results for orders placed or performed in visit on 06/04/16 (from the past 24 hour(s))  POCT Wet Prep [facility] [place])     Status: Abnormal  Collection Time: 06/04/16 10:37 AM  Result Value Ref Range   Source Wet Prep POC VAG    WBC, Wet Prep HPF POC 10-20    Bacteria Wet Prep HPF POC Moderate (A) Few   Clue Cells Wet Prep HPF POC Moderate (A) None   Clue Cells Wet Prep Whiff POC Positive Whiff    Yeast Wet Prep HPF POC None    Trichomonas Wet Prep HPF POC Absent Absent     Assessment:    Pregnancy: [id number]  Patient Active Problem List   Diagnosis Date Noted  . Nausea and vomiting in pregnancy 06/04/2016  . Encounter for supervision of other normal pregnancy 04/27/2016  . Tachycardia   . LBP (low back pain) 10/18/2011  . Vaginal discharge 12/11/2010  . Contraception  11/01/2010  . HIP PAIN, LEFT 02/22/2010  . 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 TOBACCO ABUSE 04/07/2009        Plan:      Current pregnancy issues include: See seperate problem list Problem list reviewed and updated. Dating based off early [place]; Estimated Date of Delivery: 12/25/16 Prenatal labs reviewed, notable for Chlymadia (was treated) Recheck wet prep, GC/Chlamydia today Pap performed today Bleeding and pain precautions reviewed. Importance of prenatal vitamins reviewed. Refill given Refilled Zofran Genetic screening offered. Will collect at next visit.  Already received flu vaccine Continue routine prenatal care   Follow up 4 weeks.  Caryl Ada, DO 06/04/2016, 11:28 AM PGY-3, [facility]

## 2016-06-04 NOTE — Assessment & Plan Note (Addendum)
Morning sickness associated with first trimester of pregnancy. Symptoms improved with zofran. Refilled zofran.

## 2016-06-04 NOTE — Patient Instructions (Signed)
Will contact you about labs  Schedule next OB visit in 4 weeks  Vaginal Bleeding During Pregnancy, First Trimester A small amount of bleeding (spotting) from the vagina is common in early pregnancy. Sometimes the bleeding is normal and is not a problem, and sometimes it is a sign of something serious. Be sure to tell your doctor about any bleeding from your vagina right away. Follow these instructions at home:  Watch your condition for any changes.  Follow your doctor's instructions about how active you can be.  If you are on bed rest:  You may need to stay in bed and only get up to use the bathroom.  You may be allowed to do some activities.  If you need help, make plans for someone to help you.  Write down:  The number of pads you use each day.  How often you change pads.  How soaked (saturated) your pads are.  Do not use tampons.  Do not douche.  Do not have sex or orgasms until your doctor says it is okay.  If you pass any tissue from your vagina, save the tissue so you can show it to your doctor.  Only take medicines as told by your doctor.  Do not take aspirin because it can make you bleed.  Keep all follow-up visits as told by your doctor. Contact a doctor if:  You bleed from your vagina.  You have cramps.  You have labor pains.  You have a fever that does not go away after you take medicine. Get help right away if:  You have very bad cramps in your back or belly (abdomen).  You pass large clots or tissue from your vagina.  You bleed more.  You feel light-headed or weak.  You pass out (faint).  You have chills.  You are leaking fluid or have a gush of fluid from your vagina.  You pass out while pooping (having a bowel movement). This information is not intended to replace advice given to you by your health care provider. Make sure you discuss any questions you have with your health care provider. Document Released: 09/28/2013 Document  Revised: 10/20/2015 Document Reviewed: 01/19/2013 Elsevier Interactive Patient Education  2017 [employer]

## 2016-06-06 ENCOUNTER — Telehealth: Payer: Self-pay

## 2016-06-06 LAB — CERVICOVAGINAL ANCILLARY ONLY
Chlamydia: NEGATIVE
Neisseria Gonorrhea: NEGATIVE
TRICH (WINDOWPATH): NEGATIVE

## 2016-06-06 NOTE — Telephone Encounter (Signed)
-----   Message from Pincus LargeJazma Y Phelps, DO sent at 06/06/2016  1:40 PM EST ----- [facility], please call pt and let her know that her STD testing was negative.   Thanks! Pincus LargeJazma Y Phelps, DO

## 2016-06-06 NOTE — Telephone Encounter (Signed)
Pt informed

## 2016-06-07 LAB — CYTOLOGY - PAP: HPV (WINDOPATH): DETECTED — AB

## 2016-06-08 ENCOUNTER — Telehealth: Payer: Self-pay | Admitting: Obstetrics and Gynecology

## 2016-06-08 NOTE — Telephone Encounter (Signed)
Called to discuss multiple results with patient. Phone went to voicemail so left message for patient to return call. I am in clinic all day so if she calls back and I am available please forward her to me. If not please get a number and time that she can be reached at. Thanks

## 2016-06-11 NOTE — Telephone Encounter (Signed)
Patient has an appt on 06/18/16. Lynnett Langlinais,CMA

## 2016-06-16 IMAGING — US US OB COMP LESS 14 WK
1 series · 14 of 28 positions shown · non-contrast
Comparison: None.

CLINICAL DATA: Pelvic pain for 3 days

EXAM:
OBSTETRIC <14 WK US AND TRANSVAGINAL OB US
TECHNIQUE: Both transabdominal and transvaginal ultrasound examinations were
performed for complete evaluation of the gestation as well as the
maternal uterus, adnexal regions, and pelvic cul-de-sac.
Transvaginal technique was performed to assess early pregnancy.

[Series 1: us ob comp less 14 wk · 0.21mm/px · 14 of 76 slices shown]
[im 3/76]
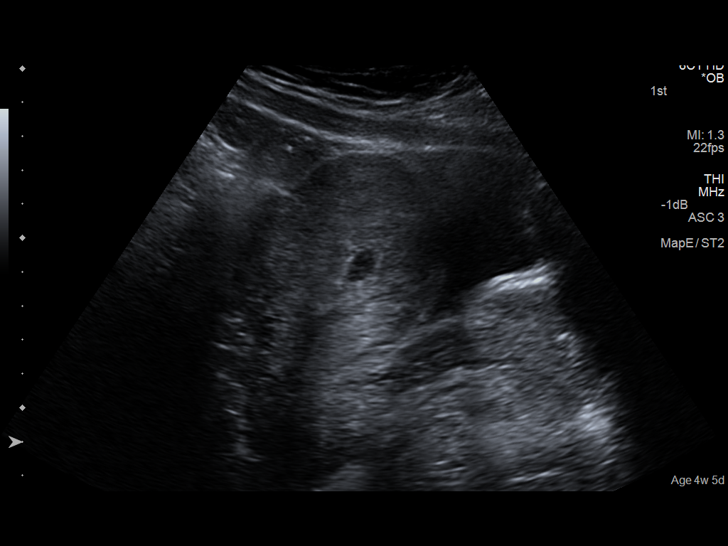
[im 9/76]
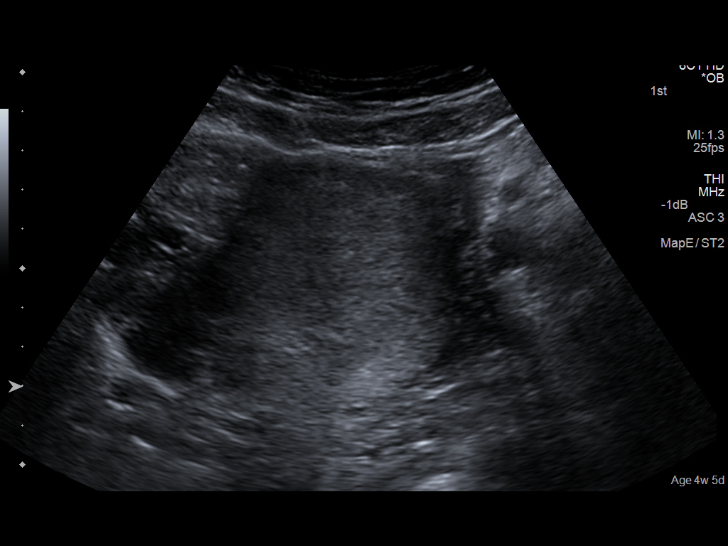
[im 14/76]
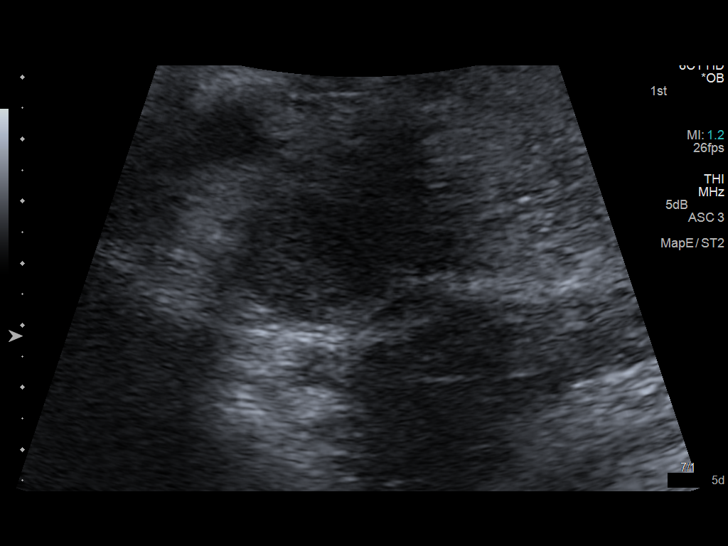
[im 20/76]
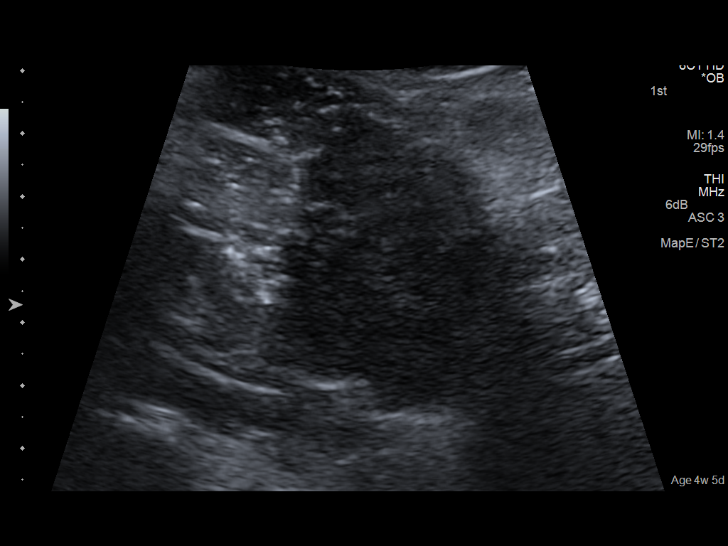
[im 26/76]
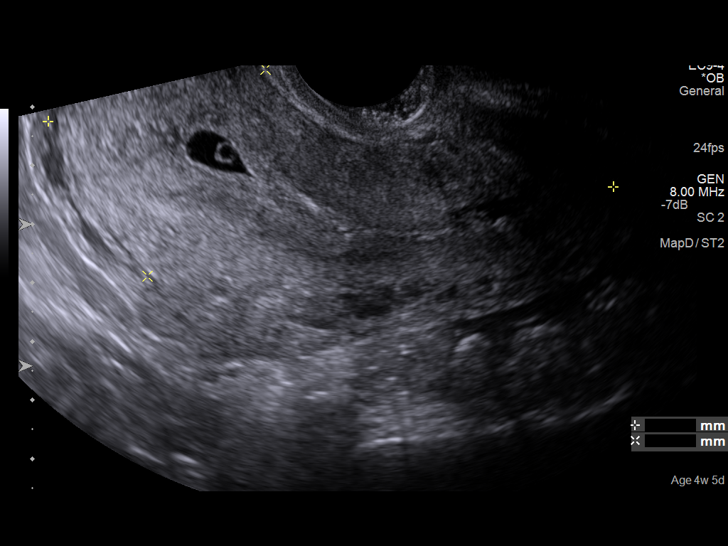
[im 31/76]
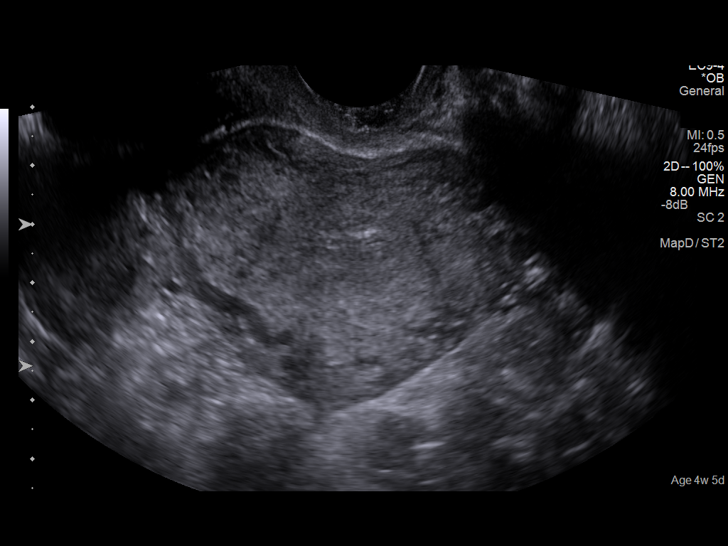
[im 37/76]
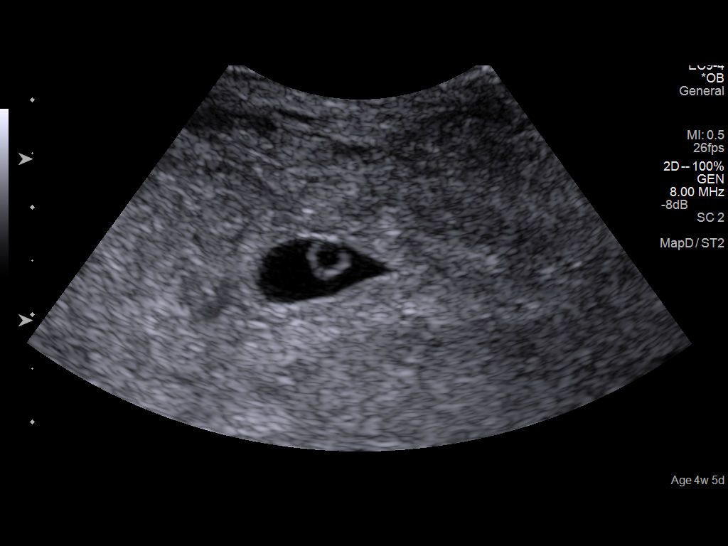
[im 42/76]
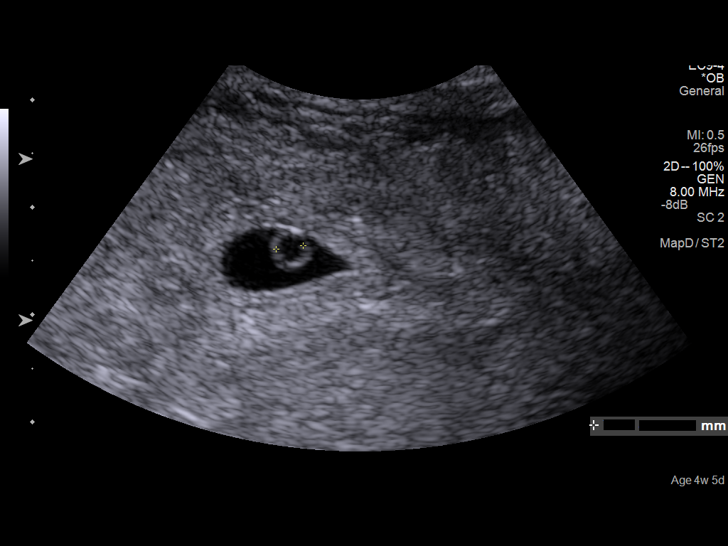
[im 48/76]
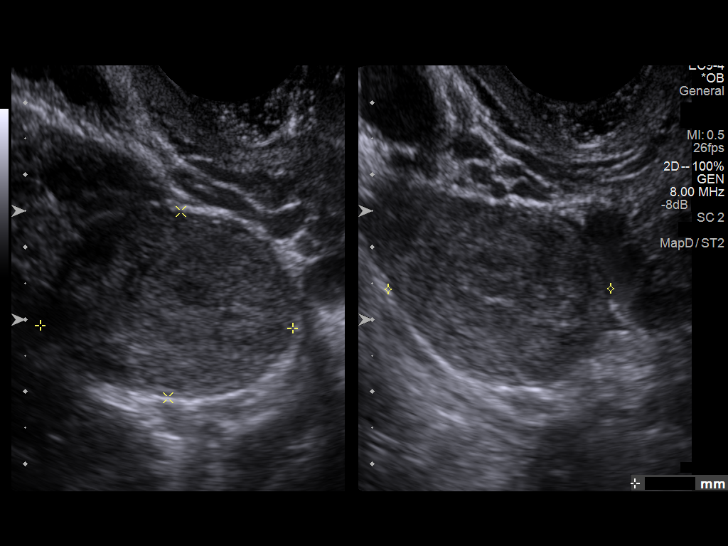
[im 53/76]
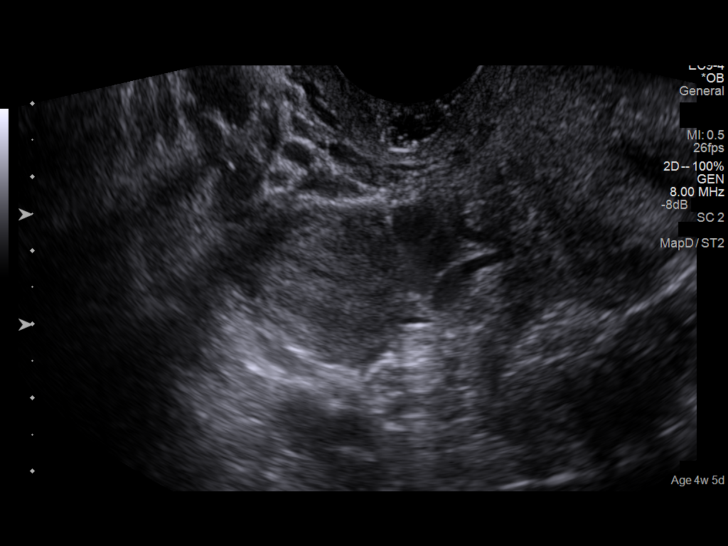
[im 59/76]
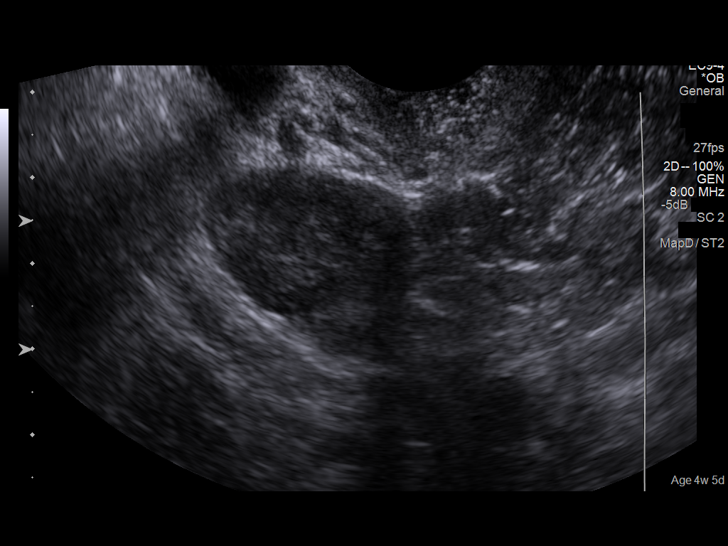
[im 64/76]
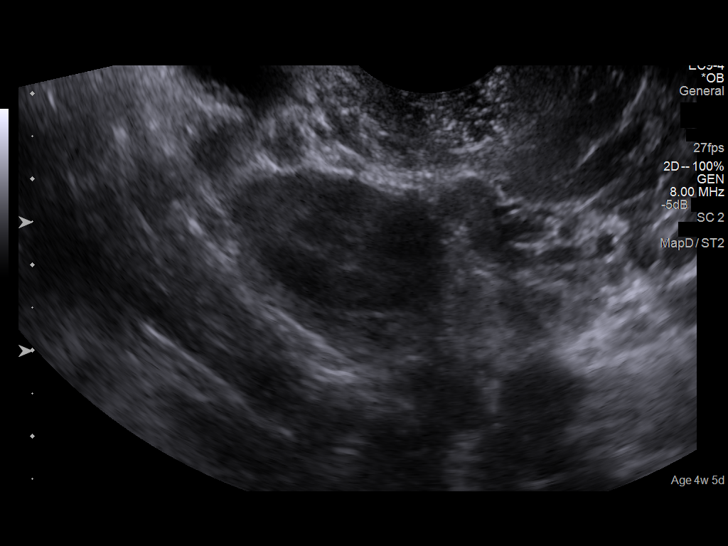
[im 70/76]
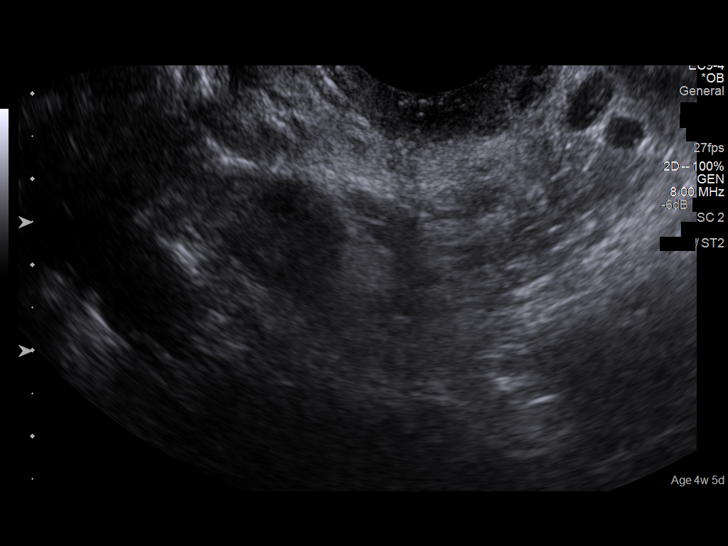
[im 76/76]
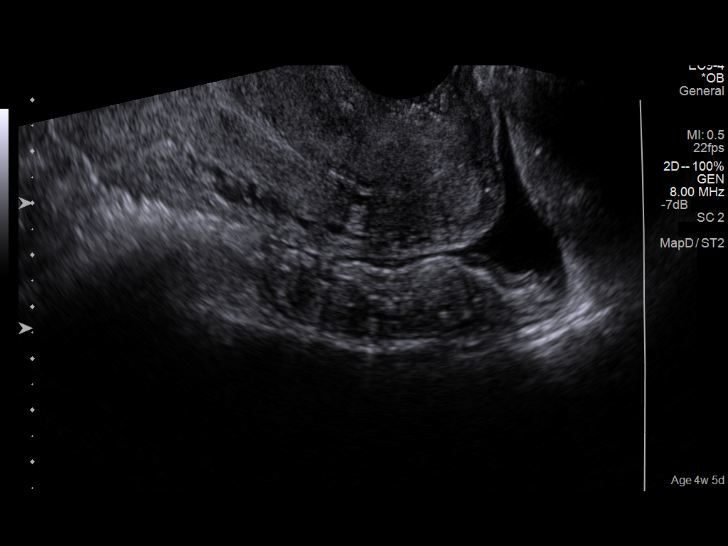

[14 of 28 positions shown; findings below may reference images not displayed]

FINDINGS: Intrauterine gestational sac: Single

Yolk sac:  Yes

Embryo:  No

Cardiac Activity: No

MSD: 10.2  mm   5 w   5  d

Maternal uterus/adnexae: Normal
IMPRESSION: Probable early intrauterine gestational sac, but no yolk sac, fetal
pole, or cardiac activity yet visualized. Recommend follow-up
quantitative B-HCG levels and follow-up US in 14 days to confirm and
assess viability. This recommendation follows SRU consensus
guidelines: Diagnostic Criteria for Nonviable Pregnancy Early in the
First Trimester. N Engl J Med 3699; [DATE].

## 2016-06-18 ENCOUNTER — Encounter: Payer: Medicaid Other | Admitting: Obstetrics and Gynecology

## 2016-06-21 ENCOUNTER — Ambulatory Visit: Payer: Medicaid Other

## 2016-06-29 ENCOUNTER — Other Ambulatory Visit: Payer: Self-pay | Admitting: Family Medicine

## 2016-06-29 MED ORDER — PRE-NATAL FORMULA PO TABS: 1.0000 | ORAL_TABLET | Freq: Every day | ORAL | 11 refills | Status: DC

## 2016-06-29 MED ORDER — ONDANSETRON 4 MG PO TBDP: 4.0000 mg | ORAL_TABLET | Freq: Three times a day (TID) | ORAL | 0 refills | Status: DC | PRN

## 2016-06-29 NOTE — Telephone Encounter (Signed)
Needs refill on zofran and prenatal vitamins,  [facility] on [address]

## 2016-07-04 ENCOUNTER — Ambulatory Visit (INDEPENDENT_AMBULATORY_CARE_PROVIDER_SITE_OTHER): Payer: Medicaid Other | Admitting: Obstetrics and Gynecology

## 2016-07-04 VITALS — BP 106/58 | HR 97 | Temp 98.8°F | Wt 157.0 lb

## 2016-07-04 DIAGNOSIS — R87619 Unspecified abnormal cytological findings in specimens from cervix uteri: Secondary | ICD-10-CM | POA: Insufficient documentation

## 2016-07-04 DIAGNOSIS — Z3482 Encounter for supervision of other normal pregnancy, second trimester: Secondary | ICD-10-CM

## 2016-07-04 DIAGNOSIS — R87612 Low grade squamous intraepithelial lesion on cytologic smear of cervix (LGSIL): Secondary | ICD-10-CM

## 2016-07-04 NOTE — Progress Notes (Signed)
Darl HouseholderRashanda Skarda is a 26 y.o. [id number] at 6540w1d for routine follow up.  She reports no complaints. Improved nausea. Taking PNV.   See flow sheet for details.  A/P: Pregnancy at 8240w1d.  Doing well.  Pregnancy progressing well.  Pregnancy issues include: 1. Encounter for supervision of other normal pregnancy in second trimester Continue routine prenatal care. Low risk pregnancy. Overview updated.  - AFP, Quad Screen - US MFM OB COMP + 14 WK; Future  2. Low grade squamous intraepithelial lesion on cytologic smear of cervix (LGSIL) Patient informed of pap results. Has scheduled colposcopy tomorrow. Will follow-up. Most likely needs repeat testing post-partum.   Anatomy ultrasound ordered to be scheduled at 18-19 weeks. Pt  is interested in genetic screening. Will collect Quad screen one week. Orders placed.  Bleeding and pain precautions reviewed. Follow up 4 weeks.  Caryl AdaJazma Phelps, DO 07/04/2016, 1:40 PM PGY-3, [facility]

## 2016-07-04 NOTE — Patient Instructions (Signed)
US ordered and date given Genetic screening done. Will contact you about results when available Keep colposcopy appointment tomorrow Go to [facility] for any obstetrical emergencies  Follow-up in 4 weeks  Second Trimester of Pregnancy The second trimester is from week 13 through week 28, month 4 through 6. This is often the time in pregnancy that you feel your best. Often times, morning sickness has lessened or quit. You may have more energy, and you may get hungry more often. Your unborn baby (fetus) is growing rapidly. At the end of the sixth month, he or she is about 9 inches long and weighs about 1 pounds. You will likely feel the baby move (quickening) between 18 and 20 weeks of pregnancy. Follow these instructions at home:  Avoid all smoking, herbs, and alcohol. Avoid drugs not approved by your doctor.  Do not use any tobacco products, including cigarettes, chewing tobacco, and electronic cigarettes. If you need help quitting, ask your doctor. You may get counseling or other support to help you quit.  Only take medicine as told by your doctor. Some medicines are safe and some are not during pregnancy.  Exercise only as told by your doctor. Stop exercising if you start having cramps.  Eat regular, healthy meals.  Wear a good support bra if your breasts are tender.  Do not use hot tubs, steam rooms, or saunas.  Wear your seat belt when driving.  Avoid raw meat, uncooked cheese, and liter boxes and soil used by cats.  Take your prenatal vitamins.  Take 1500-2000 milligrams of calcium daily starting at the 20th week of pregnancy until you deliver your baby.  Try taking medicine that helps you poop (stool softener) as needed, and if your doctor approves. Eat more fiber by eating fresh fruit, vegetables, and whole grains. Drink enough fluids to keep your pee (urine) clear or pale yellow.  Take warm water baths (sitz baths) to soothe pain or discomfort caused by hemorrhoids. Use  hemorrhoid cream if your doctor approves.  If you have puffy, bulging veins (varicose veins), wear support hose. Raise (elevate) your feet for 15 minutes, 3-4 times a day. Limit salt in your diet.  Avoid heavy lifting, wear low heals, and sit up straight.  Rest with your legs raised if you have leg cramps or low back pain.  Visit your dentist if you have not gone during your pregnancy. Use a soft toothbrush to brush your teeth. Be gentle when you floss.  You can have sex (intercourse) unless your doctor tells you not to.  Go to your doctor visits. Get help if:  You feel dizzy.  You have mild cramps or pressure in your lower belly (abdomen).  You have a nagging pain in your belly area.  You continue to feel sick to your stomach (nauseous), throw up (vomit), or have watery poop (diarrhea).  You have bad smelling fluid coming from your vagina.  You have pain with peeing (urination). Get help right away if:  You have a fever.  You are leaking fluid from your vagina.  You have spotting or bleeding from your vagina.  You have severe belly cramping or pain.  You lose or gain weight rapidly.  You have trouble catching your breath and have chest pain.  You notice sudden or extreme puffiness (swelling) of your face, hands, ankles, feet, or legs.  You have not felt the baby move in over an hour.  You have severe headaches that do not go away with medicine.  You have vision changes. This information is not intended to replace advice given to you by your health care provider. Make sure you discuss any questions you have with your health care provider. Document Released: 08/08/2009 Document Revised: 10/20/2015 Document Reviewed: 07/15/2012 Elsevier Interactive Patient Education  2017 Elsevier Inc.  

## 2016-07-05 ENCOUNTER — Telehealth: Payer: Self-pay | Admitting: Family Medicine

## 2016-07-05 ENCOUNTER — Ambulatory Visit (INDEPENDENT_AMBULATORY_CARE_PROVIDER_SITE_OTHER): Payer: Medicaid Other | Admitting: Family Medicine

## 2016-07-05 VITALS — BP 102/62 | HR 89 | Temp 98.5°F | Ht 63.0 in | Wt 158.6 lb

## 2016-07-05 DIAGNOSIS — R87612 Low grade squamous intraepithelial lesion on cytologic smear of cervix (LGSIL): Secondary | ICD-10-CM

## 2016-07-05 DIAGNOSIS — N898 Other specified noninflammatory disorders of vagina: Secondary | ICD-10-CM | POA: Diagnosis not present

## 2016-07-05 LAB — POCT WET PREP (WET MOUNT): Trichomonas Wet Prep HPF POC: ABSENT

## 2016-07-05 NOTE — Progress Notes (Signed)
Patient's O2 sat was recorded as 87% room air.  Patient was called to return to clinic for O2 check.  Patient reported SOB with ambulation.  Patient to walk in and informed front desk for her to see Dr. Lum BabeEniola or Fredonia Highlandamika, RN.  Clovis PuMartin, Andra Heslin L, RN

## 2016-07-05 NOTE — Patient Instructions (Signed)
You were seen in clinic today for a colposcopy.  No abnormal findings.  Will need to repeat 6 weeks after you have your baby.  We performed a wet prep for your vaginal discharge found on exam and will call you with the results.

## 2016-07-05 NOTE — Progress Notes (Signed)
Patient ID: Shannon Randolph, female   DOB: 30-Dec-1990, 26 y.o.   MRN: [id number]  Chief Complaint  Patient presents with  . Colposcopy    HPI Shannon Randolph is a 26 y.o. female.   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 Previous colposcopy: None. Prior cervical treatment: no treatment.  Past Medical History:  Diagnosis Date  . Bacterial vaginosis   . Infection    UTI, gonorrhea  . No pertinent past medical history    Past Surgical History:  Procedure Laterality Date  . NO PAST SURGERIES     Family History  Problem Relation Age of Onset  . Hearing loss Neg Hx    Social History Social History  Substance Use Topics  . Smoking status: Former Smoker    Quit date: 01/11/2012  . Smokeless tobacco: Former NeurosurgeonUser    Quit date: 01/11/2012  . Alcohol use No   No Known Allergies  Current Outpatient Prescriptions  Medication Sig Dispense Refill  . metroNIDAZOLE (FLAGYL) 500 MG tablet Take 1 tablet (500 mg total) by mouth 2 (two) times daily. 14 tablet 0  . ondansetron (ZOFRAN-ODT) 4 MG disintegrating tablet Take 1 tablet (4 mg total) by mouth every 8 (eight) hours as needed for nausea or vomiting. 30 tablet 0  . Prenatal Multivit-Min-Fe-FA (PRE-NATAL FORMULA) TABS Take 1 tablet by mouth daily. 30 each 11  . triamcinolone ointment (KENALOG) 0.1 % Apply 1 application topically 2 (two) times daily. 80 g 0   No current facility-administered medications for this visit.    Review of Systems Review of Systems  Blood pressure 102/62, pulse 89, temperature 98.5 F (36.9 C), temperature source Oral, height 5\' 3"  (1.6 m), weight 158 lb 9.6 oz (71.9 kg), last menstrual period 02/26/2016, SpO2 (!) 87 %, unknown if currently breastfeeding.  Physical Exam  Female genitalia: not done normal external genitalia, vulva, vagina, cervix, uterus and adnexa  No acetal white lesions, lugol's  iodine with no abnormal discoloration, negative for abnormal vessel distribution.  Transition zone was adequate.  Some creamy white discharge noted on cervical exam.  Wet prep was performed during the procedure and sent to lab for testing.    Data Reviewed Pap reviewed 07/05/2016   Assessment    Procedure Details  The risks and benefits of the procedure and Written informed consent obtained.  Speculum placed in vagina and excellent visualization of cervix achieved, cervix swabbed x 3 with acetic acid solution.  Specimens: None   Complications: none.   Plan   Specimens labelled and sent to Pathology. Return to discuss Pathology results in 2 weeks. No abnormal findings on today's exam.  Return 6 weeks postpartum for repeat colposcopy.       Freddrick MarchYashika Chaia Ikard, MD  07/05/2016, 11:40 AM  [facility], PGY-1

## 2016-07-05 NOTE — Telephone Encounter (Signed)
Patient was meant to return to the clinic for reassessment today. I called to check on her since she is yet to return to the clinic. Message left to call back if she is unable to return to the clinic today.

## 2016-07-05 NOTE — Telephone Encounter (Signed)
I again called and spoke with the patient. She stated she is still trying to get back to the clinic for O2 Sat check. She stated she feels fine without difficulty breathing. I advised in case she is unable to make follow up with me today, she should go to the ED if having SOB. She verbalized understanding.   I also discussed her wet prep with her. She has BV. Antibiotic e-scribed. She will pick med up as soon as she can.  While I was about to prescribe Flagyl to her, I realized she was prescribed Flagyl in Jan and had a positive BV at the time. I called her pharmacy and they confirmed that she picked up the medication 06/06/16.  I called her back to check if she actually took that medication. She stated she never took the medicine and she still have it. She will go ahead and complete this meds. Return if she continues to have vaginal discharge.

## 2016-07-31 ENCOUNTER — Other Ambulatory Visit: Payer: Self-pay | Admitting: Obstetrics and Gynecology

## 2016-07-31 ENCOUNTER — Ambulatory Visit (HOSPITAL_COMMUNITY)
Admission: RE | Admit: 2016-07-31 | Discharge: 2016-07-31 | Disposition: A | Discharge status: Home / Self Care | Payer: Medicaid Other | Source: Ambulatory Visit | Attending: Family Medicine | Admitting: Family Medicine

## 2016-07-31 ENCOUNTER — Ambulatory Visit (HOSPITAL_COMMUNITY): Payer: Medicaid Other

## 2016-07-31 DIAGNOSIS — Z3482 Encounter for supervision of other normal pregnancy, second trimester: Secondary | ICD-10-CM

## 2016-07-31 DIAGNOSIS — Z3A19 19 weeks gestation of pregnancy: Secondary | ICD-10-CM | POA: Insufficient documentation

## 2016-07-31 DIAGNOSIS — Z363 Encounter for antenatal screening for malformations: Secondary | ICD-10-CM | POA: Diagnosis not present

## 2016-08-31 ENCOUNTER — Ambulatory Visit (INDEPENDENT_AMBULATORY_CARE_PROVIDER_SITE_OTHER): Payer: Medicaid Other | Admitting: Family Medicine

## 2016-08-31 DIAGNOSIS — Z3482 Encounter for supervision of other normal pregnancy, second trimester: Secondary | ICD-10-CM

## 2016-08-31 MED ORDER — METRONIDAZOLE 0.75 % VA GEL: 1.0000 | Freq: Every day | VAGINAL | 0 refills | Status: DC

## 2016-08-31 NOTE — Progress Notes (Signed)
Shannon Randolph is a 26 y.o. [id number] at [redacted]w[redacted]d here for routine follow up.  She reports worsening lower back pain. She has back pain at baseline in the lumbar region. Notes it feels light and pulling bilaterally. No shooting pain, paresthesias, or weakness. Wants to know if she can go on disability. She hasn't tried anything for the pain.   See flow sheet for details.  Back: Normal to inspection. No tenderness over the spinous processes or paraspinal muscles. Some spacticity noted. 5/5 strength in LEs. 2/4 DTRs. Sensation intact. Negative SLR.   A/P: Pregnancy at [redacted]w[redacted]d.  Doing well.   Pregnancy issues include lumbar pain Anatomy scan reviewed, problems are not noted.  Pt will need repeat colposcopy 6 weeks post partum Preterm labor precautions reviewed. Follow up 4 weeks in [facility] OB clinic.

## 2016-08-31 NOTE — Patient Instructions (Signed)
Use Metrogel vaginally for 5 days.  Start doing prenatal yoga for your back pain.  You can use Tylenol as needed for pain.   Second Trimester of Pregnancy The second trimester is from week 13 through week 28, month 4 through 6. This is often the time in pregnancy that you feel your best. Often times, morning sickness has lessened or quit. You may have more energy, and you may get hungry more often. Your unborn baby (fetus) is growing rapidly. At the end of the sixth month, he or she is about 9 inches long and weighs about 1 pounds. You will likely feel the baby move (quickening) between 18 and 20 weeks of pregnancy. Follow these instructions at home:  Avoid all smoking, herbs, and alcohol. Avoid drugs not approved by your doctor.  Do not use any tobacco products, including cigarettes, chewing tobacco, and electronic cigarettes. If you need help quitting, ask your doctor. You may get counseling or other support to help you quit.  Only take medicine as told by your doctor. Some medicines are safe and some are not during pregnancy.  Exercise only as told by your doctor. Stop exercising if you start having cramps.  Eat regular, healthy meals.  Wear a good support bra if your breasts are tender.  Do not use hot tubs, steam rooms, or saunas.  Wear your seat belt when driving.  Avoid raw meat, uncooked cheese, and liter boxes and soil used by cats.  Take your prenatal vitamins.  Take 1500-2000 milligrams of calcium daily starting at the 20th week of pregnancy until you deliver your baby.  Try taking medicine that helps you poop (stool softener) as needed, and if your doctor approves. Eat more fiber by eating fresh fruit, vegetables, and whole grains. Drink enough fluids to keep your pee (urine) clear or pale yellow.  Take warm water baths (sitz baths) to soothe pain or discomfort caused by hemorrhoids. Use hemorrhoid cream if your doctor approves.  If you have puffy, bulging veins  (varicose veins), wear support hose. Raise (elevate) your feet for 15 minutes, 3-4 times a day. Limit salt in your diet.  Avoid heavy lifting, wear low heals, and sit up straight.  Rest with your legs raised if you have leg cramps or low back pain.  Visit your dentist if you have not gone during your pregnancy. Use a soft toothbrush to brush your teeth. Be gentle when you floss.  You can have sex (intercourse) unless your doctor tells you not to.  Go to your doctor visits. Get help if:  You feel dizzy.  You have mild cramps or pressure in your lower belly (abdomen).  You have a nagging pain in your belly area.  You continue to feel sick to your stomach (nauseous), throw up (vomit), or have watery poop (diarrhea).  You have bad smelling fluid coming from your vagina.  You have pain with peeing (urination). Get help right away if:  You have a fever.  You are leaking fluid from your vagina.  You have spotting or bleeding from your vagina.  You have severe belly cramping or pain.  You lose or gain weight rapidly.  You have trouble catching your breath and have chest pain.  You notice sudden or extreme puffiness (swelling) of your face, hands, ankles, feet, or legs.  You have not felt the baby move in over an hour.  You have severe headaches that do not go away with medicine.  You have vision changes. This information  is not intended to replace advice given to you by your health care provider. Make sure you discuss any questions you have with your health care provider. Document Released: 08/08/2009 Document Revised: 10/20/2015 Document Reviewed: 07/15/2012 Elsevier Interactive Patient Education  2017 [employer].

## 2016-09-25 DIAGNOSIS — O2692 Pregnancy related conditions, unspecified, second trimester: Secondary | ICD-10-CM | POA: Diagnosis not present

## 2016-09-25 DIAGNOSIS — R079 Chest pain, unspecified: Secondary | ICD-10-CM | POA: Diagnosis not present

## 2016-09-25 DIAGNOSIS — J189 Pneumonia, unspecified organism: Secondary | ICD-10-CM | POA: Diagnosis not present

## 2016-09-25 DIAGNOSIS — Z3A28 28 weeks gestation of pregnancy: Secondary | ICD-10-CM | POA: Diagnosis not present

## 2016-09-25 DIAGNOSIS — R000 Tachycardia, unspecified: Secondary | ICD-10-CM | POA: Diagnosis not present

## 2016-10-01 ENCOUNTER — Encounter: Payer: Medicaid Other | Admitting: Student

## 2016-10-03 ENCOUNTER — Telehealth: Payer: Self-pay | Admitting: Family Medicine

## 2016-10-03 NOTE — Telephone Encounter (Signed)
[facility] May 3. Employer [employer] is requesting a drs note stating she is ok to go back to work. She needs this today.  She went to work today and was sent home because she didntt have the letter.  She cant go back to work until she has the letter. [facility] says this letter has to come from her PCP. Please advise

## 2016-10-04 NOTE — Telephone Encounter (Signed)
The patient has not been seen in our clinic since her hospitalization. She needs follow up in our clinic before a note can be done sending her back to work. Dr. Doroteo GlassmanPhelps is her OB provider therefore it'd be best if she can get in with her, but whoever she can get in with works.  Joanna Puffrystal S. Christohper Dube, MD [facility] Resident  10/04/2016, 11:46 AM

## 2016-10-05 NOTE — Telephone Encounter (Signed)
Will forward to red team.  Shannon Randolph,CMA  

## 2016-10-09 NOTE — Telephone Encounter (Signed)
Called patient, no answer, no voicemail. Patient has appointment with PCP already scheduled for tomm.

## 2016-10-10 ENCOUNTER — Ambulatory Visit (INDEPENDENT_AMBULATORY_CARE_PROVIDER_SITE_OTHER): Payer: Medicaid Other | Admitting: Family Medicine

## 2016-10-10 VITALS — BP 115/70 | HR 96 | Temp 98.3°F | Wt 162.2 lb

## 2016-10-10 DIAGNOSIS — Z09 Encounter for follow-up examination after completed treatment for conditions other than malignant neoplasm: Secondary | ICD-10-CM

## 2016-10-10 DIAGNOSIS — B348 Other viral infections of unspecified site: Secondary | ICD-10-CM

## 2016-10-10 NOTE — Progress Notes (Signed)
Shannon SlickerRashanda Thiem is a 26 y.o. [id number] at 3544w1d presenting to get cleared for work.   She was [facility] with respiratory distress, but no hypoxia. At that time, PE was ruled out. RVP was positive for rhinovirus. She completed a 5-day course of azithromycin.  Since that time, patient denies any shortness of breath. She continues to have some lower back pain intermittently.  Mild lower extremity swelling that is stable. It is most prominent at the end of the day.  Works at [employer]; on her feet often. She notes it is very bothersome for her to go to work, as the parking lot [place] [employer] [place]  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 No nausea, vomiting. She's eating well.   Blood pressure 115/70, pulse 96, temperature 98.3 F (36.8 C), weight 162 lb 3.2 oz (73.6 kg), last menstrual period 02/26/2016 Well appearing. Lungs CTAB without increased WOB. RRR, no m/r/g noted. Trace pitting edema noted.  Measuring 27.5cm today.  Plan: Shannon SlickerRashanda Randolph is a 26 y.o. [id number] at 2744w1d presenting for work clearance after her most recent hospitalization. She is well. On exam without any respiratory issues. Patient is cleared for work. Provided a note asking for transportation to and from the parking lot given the long distance between both. The patient is noted to be measuring small and had a 4lb weight loss.  - pt to f/u in 1 week for routine prenatal care (she is overdue) - at that time she will need a 1hr GTT (pt informed). - please measure pt and consider US if she continues to measure smaller or fails to gain weight.  Joanna Puffrystal S. Dorsey, MD [facility] Resident  10/10/2016, 4:45 PM

## 2016-10-10 NOTE — Patient Instructions (Addendum)
It was good to see you again.  Your weight has decreased by 4 pounds, and the baby is measuring a little bit small for his age. Sometimes the baby can measure small just due to positioning.  Please follow-up in 1 week for routine prenatal care and so that we can follow-up with the size. You will also need to do the 1 hour glucose test at that time.

## 2016-11-01 ENCOUNTER — Ambulatory Visit (INDEPENDENT_AMBULATORY_CARE_PROVIDER_SITE_OTHER): Payer: Medicaid Other | Admitting: Family Medicine

## 2016-11-01 VITALS — BP 98/58 | HR 110 | Temp 97.8°F | Wt 164.4 lb

## 2016-11-01 DIAGNOSIS — N898 Other specified noninflammatory disorders of vagina: Secondary | ICD-10-CM | POA: Diagnosis not present

## 2016-11-01 DIAGNOSIS — Z3403 Encounter for supervision of normal first pregnancy, third trimester: Secondary | ICD-10-CM

## 2016-11-01 DIAGNOSIS — Z23 Encounter for immunization: Secondary | ICD-10-CM

## 2016-11-01 LAB — POCT WET PREP (WET MOUNT)
Clue Cells Wet Prep Whiff POC: NEGATIVE
TRICHOMONAS WET PREP HPF POC: ABSENT

## 2016-11-01 LAB — POCT 1 HR PRENATAL GLUCOSE: GLUCOSE 1 HR PRENATAL, POC: 124 mg/dL

## 2016-11-01 NOTE — Addendum Note (Signed)
Addended by: Garen GramsBENTON, Olivya Sobol on: 11/01/2016 04:39 PM   Modules accepted: Orders

## 2016-11-01 NOTE — Patient Instructions (Signed)

## 2016-11-01 NOTE — Progress Notes (Signed)
Darl HouseholderRashanda Megill is a 26 y.o. [id number] at 3720w2d here for routine follow up.  She reports feeling well except a lot of vaginal discharge.  Wonders if she has BV again. See flow sheet for details.  A/P: Pregnancy at 6220w2d.  Doing well.   Pregnancy issues include weight loss - now gaining back and baby measuring well.  Infant feeding choice: breast and formula Contraception choice: desires BTL - needs to sign BTL papers by 36 weeks Infant circumcision desired: yes  Tdap was given today. GTT done today as well as 28 week labs (CBC, HIV, RPR)  Preterm labor and fetal movement precautions reviewed. Safe sleep discussed. Follow up 2 weeks with OB clinic  Bacigalupo, Marzella SchleinAngela M, MD, MPH PGY-3,  [facility] 11/01/2016 3:51 PM

## 2016-11-02 ENCOUNTER — Other Ambulatory Visit: Payer: Self-pay | Admitting: Family Medicine

## 2016-11-02 LAB — CBC
HEMATOCRIT: 32.2 % — AB (ref 34.0–46.6)
HEMOGLOBIN: 10.8 g/dL — AB (ref 11.1–15.9)
MCH: 28.3 pg (ref 26.6–33.0)
MCHC: 33.5 g/dL (ref 31.5–35.7)
MCV: 84 fL (ref 79–97)
Platelets: 172 10*3/uL (ref 150–379)
RBC: 3.82 x10E6/uL (ref 3.77–5.28)
RDW: 13.3 % (ref 12.3–15.4)
WBC: 12.2 10*3/uL — ABNORMAL HIGH (ref 3.4–10.8)

## 2016-11-02 LAB — RPR: RPR: NONREACTIVE

## 2016-11-02 LAB — HIV ANTIBODY (ROUTINE TESTING W REFLEX): HIV SCREEN 4TH GENERATION: NONREACTIVE

## 2016-11-02 MED ORDER — DOCUSATE SODIUM 100 MG PO CAPS
100.0000 mg | ORAL_CAPSULE | Freq: Two times a day (BID) | ORAL | 2 refills | Status: DC
Start: 2016-11-02 — End: 2016-11-27

## 2016-11-02 MED ORDER — FERROUS SULFATE 325 (65 FE) MG PO TABS: 325.0000 mg | ORAL_TABLET | Freq: Every day | ORAL | 3 refills | Status: DC

## 2016-11-06 NOTE — Progress Notes (Signed)
LM on VM to CB for lab report.

## 2016-11-07 ENCOUNTER — Telehealth: Payer: Self-pay | Admitting: *Deleted

## 2016-11-07 NOTE — Telephone Encounter (Signed)
Left message on voicemail for patient to call back regarding lab work.

## 2016-11-07 NOTE — Telephone Encounter (Signed)
-----   Message from Erasmo DownerAngela M Bacigalupo, MD sent at 11/02/2016  9:20 AM EDT ----- Please let patient know that labs are normal, except for anemia.  She should be taking her prenatal vitamins and daily iron supplement.  This may make her constipated, so she should also take a stool softener.  Rxs at pharmacy. Please let patient know.  Erasmo DownerBacigalupo, Angela M, MD, MPH PGY-3,  [facility] 11/02/2016 9:20 AM

## 2016-11-14 NOTE — Progress Notes (Signed)
Shannon HouseholderRashanda Randolph is a 26 y.o. [id number] at 608w1d here for routine follow up.  She reports feeling well. See flow sheet for details.  A/P: Pregnancy at 118w1d.  Doing well.   Pregnancy issues include baby was measuring small previously - now doing well. Maternal weight picking up.  Infant feeding choice: breast and formula Contraception choice: thinking about BTL, not sure.  Advised that papers need to be signed 30 days before procedure Infant circumcision desired: yes  Tdap was not given today. Already given previously  Preterm labor and fetal movement precautions reviewed. Safe sleep discussed. Follow up 2 weeks.  Erasmo DownerBacigalupo, Angela M, MD, MPH PGY-3,  [facility] 11/14/2016 9:04 AM

## 2016-11-15 ENCOUNTER — Ambulatory Visit (INDEPENDENT_AMBULATORY_CARE_PROVIDER_SITE_OTHER): Payer: Medicaid Other | Admitting: Family Medicine

## 2016-11-15 VITALS — BP 98/58 | HR 96 | Temp 98.8°F | Wt 169.2 lb

## 2016-11-15 DIAGNOSIS — Z3403 Encounter for supervision of normal first pregnancy, third trimester: Secondary | ICD-10-CM

## 2016-11-15 NOTE — Patient Instructions (Signed)
Third Trimester of Pregnancy The third trimester is from week 28 through week 40 (months 7 through 9). The third trimester is a time when the unborn baby (fetus) is growing rapidly. At the end of the ninth month, the fetus is about 20 inches in length and weighs 6-10 pounds. Body changes during your third trimester Your body will continue to go through many changes during pregnancy. The changes vary from woman to woman. During the third trimester:  Your weight will continue to increase. You can expect to gain 25-35 pounds (11-16 kg) by the end of the pregnancy.  You may begin to get stretch marks on your hips, abdomen, and breasts.  You may urinate more often because the fetus is moving lower into your pelvis and pressing on your bladder.  You may develop or continue to have heartburn. This is caused by increased hormones that slow down muscles in the digestive tract.  You may develop or continue to have constipation because increased hormones slow digestion and cause the muscles that push waste through your intestines to relax.  You may develop hemorrhoids. These are swollen veins (varicose veins) in the rectum that can itch or be painful.  You may develop swollen, bulging veins (varicose veins) in your legs.  You may have increased body aches in the pelvis, back, or thighs. This is due to weight gain and increased hormones that are relaxing your joints.  You may have changes in your hair. These can include thickening of your hair, rapid growth, and changes in texture. Some women also have hair loss during or after pregnancy, or hair that feels dry or thin. Your hair will most likely return to normal after your baby is born.  Your breasts will continue to grow and they will continue to become tender. A yellow fluid (colostrum) may leak from your breasts. This is the first milk you are producing for your baby.  Your belly button may stick out.  You may notice more swelling in your hands,  face, or ankles.  You may have increased tingling or numbness in your hands, arms, and legs. The skin on your belly may also feel numb.  You may feel short of breath because of your expanding uterus.  You may have more problems sleeping. This can be caused by the size of your belly, increased need to urinate, and an increase in your body's metabolism.  You may notice the fetus "dropping," or moving lower in your abdomen (lightening).  You may have increased vaginal discharge.  You may notice your joints feel loose and you may have pain around your pelvic bone.  What to expect at prenatal visits You will have prenatal exams every 2 weeks until week 36. Then you will have weekly prenatal exams. During a routine prenatal visit:  You will be weighed to make sure you and the baby are growing normally.  Your blood pressure will be taken.  Your abdomen will be measured to track your baby's growth.  The fetal heartbeat will be listened to.  Any test results from the previous visit will be discussed.  You may have a cervical check near your due date to see if your cervix has softened or thinned (effaced).  You will be tested for Group B streptococcus. This happens between 35 and 37 weeks.  Your health care provider may ask you:  What your birth plan is.  How you are feeling.  If you are feeling the baby move.  If you have had   any abnormal symptoms, such as leaking fluid, bleeding, severe headaches, or abdominal cramping.  If you are using any tobacco products, including cigarettes, chewing tobacco, and electronic cigarettes.  If you have any questions.  Other tests or screenings that may be performed during your third trimester include:  Blood tests that check for low iron levels (anemia).  Fetal testing to check the health, activity level, and growth of the fetus. Testing is done if you have certain medical conditions or if there are problems during the  pregnancy.  Nonstress test (NST). This test checks the health of your baby to make sure there are no signs of problems, such as the baby not getting enough oxygen. During this test, a belt is placed around your belly. The baby is made to move, and its heart rate is monitored during movement.  What is false labor? False labor is a condition in which you feel small, irregular tightenings of the muscles in the womb (contractions) that usually go away with rest, changing position, or drinking water. These are called Braxton Hicks contractions. Contractions may last for hours, days, or even weeks before true labor sets in. If contractions come at regular intervals, become more frequent, increase in intensity, or become painful, you should see your health care provider. What are the signs of labor?  Abdominal cramps.  Regular contractions that start at 10 minutes apart and become stronger and more frequent with time.  Contractions that start on the top of the uterus and spread down to the lower abdomen and back.  Increased pelvic pressure and dull back pain.  A watery or bloody mucus discharge that comes from the vagina.  Leaking of amniotic fluid. This is also known as your "water breaking." It could be a slow trickle or a gush. Let your health care provider know if it has a color or strange odor. If you have any of these signs, call your health care provider right away, even if it is before your due date. Follow these instructions at home: Medicines  Follow your health care provider's instructions regarding medicine use. Specific medicines may be either safe or unsafe to take during pregnancy.  Take a prenatal vitamin that contains at least 600 micrograms (mcg) of folic acid.  If you develop constipation, try taking a stool softener if your health care provider approves. Eating and drinking  Eat a balanced diet that includes fresh fruits and vegetables, whole grains, good sources of protein  such as meat, eggs, or tofu, and low-fat dairy. Your health care provider will help you determine the amount of weight gain that is right for you.  Avoid raw meat and uncooked cheese. These carry germs that can cause birth defects in the baby.  If you have low calcium intake from food, talk to your health care provider about whether you should take a daily calcium supplement.  Eat four or five small meals rather than three large meals a day.  Limit foods that are high in fat and processed sugars, such as fried and sweet foods.  To prevent constipation: ? Drink enough fluid to keep your urine clear or pale yellow. ? Eat foods that are high in fiber, such as fresh fruits and vegetables, whole grains, and beans. Activity  Exercise only as directed by your health care provider. Most women can continue their usual exercise routine during pregnancy. Try to exercise for 30 minutes at least 5 days a week. Stop exercising if you experience uterine contractions.  Avoid heavy   lifting.  Do not exercise in extreme heat or humidity, or at high altitudes.  Wear low-heel, comfortable shoes.  Practice good posture.  You may continue to have sex unless your health care provider tells you otherwise. Relieving pain and discomfort  Take frequent breaks and rest with your legs elevated if you have leg cramps or low back pain.  Take warm sitz baths to soothe any pain or discomfort caused by hemorrhoids. Use hemorrhoid cream if your health care provider approves.  Wear a good support bra to prevent discomfort from breast tenderness.  If you develop varicose veins: ? Wear support pantyhose or compression stockings as told by your healthcare provider. ? Elevate your feet for 15 minutes, 3-4 times a day. Prenatal care  Write down your questions. Take them to your prenatal visits.  Keep all your prenatal visits as told by your health care provider. This is important. Safety  Wear your seat belt at  all times when driving.  Make a list of emergency phone numbers, including numbers for family, friends, the hospital, and police and fire departments. General instructions  Avoid cat litter boxes and soil used by cats. These carry germs that can cause birth defects in the baby. If you have a cat, ask someone to clean the litter box for you.  Do not travel far distances unless it is absolutely necessary and only with the approval of your health care provider.  Do not use hot tubs, steam rooms, or saunas.  Do not drink alcohol.  Do not use any products that contain nicotine or tobacco, such as cigarettes and e-cigarettes. If you need help quitting, ask your health care provider.  Do not use any medicinal herbs or unprescribed drugs. These chemicals affect the formation and growth of the baby.  Do not douche or use tampons or scented sanitary pads.  Do not cross your legs for long periods of time.  To prepare for the arrival of your baby: ? Take prenatal classes to understand, practice, and ask questions about labor and delivery. ? Make a trial run to the hospital. ? Visit the hospital and tour the maternity area. ? Arrange for maternity or paternity leave through employers. ? Arrange for family and friends to take care of pets while you are in the hospital. ? Purchase a rear-facing car seat and make sure you know how to install it in your car. ? Pack your hospital bag. ? Prepare the baby's nursery. Make sure to remove all pillows and stuffed animals from the baby's crib to prevent suffocation.  Visit your dentist if you have not gone during your pregnancy. Use a soft toothbrush to brush your teeth and be gentle when you floss. Contact a health care provider if:  You are unsure if you are in labor or if your water has broken.  You become dizzy.  You have mild pelvic cramps, pelvic pressure, or nagging pain in your abdominal area.  You have lower back pain.  You have persistent  nausea, vomiting, or diarrhea.  You have an unusual or bad smelling vaginal discharge.  You have pain when you urinate. Get help right away if:  Your water breaks before 37 weeks.  You have regular contractions less than 5 minutes apart before 37 weeks.  You have a fever.  You are leaking fluid from your vagina.  You have spotting or bleeding from your vagina.  You have severe abdominal pain or cramping.  You have rapid weight loss or weight gain.    You have shortness of breath with chest pain.  You notice sudden or extreme swelling of your face, hands, ankles, feet, or legs.  Your baby makes fewer than 10 movements in 2 hours.  You have severe headaches that do not go away when you take medicine.  You have vision changes. Summary  The third trimester is from week 28 through week 40, months 7 through 9. The third trimester is a time when the unborn baby (fetus) is growing rapidly.  During the third trimester, your discomfort may increase as you and your baby continue to gain weight. You may have abdominal, leg, and back pain, sleeping problems, and an increased need to urinate.  During the third trimester your breasts will keep growing and they will continue to become tender. A yellow fluid (colostrum) may leak from your breasts. This is the first milk you are producing for your baby.  False labor is a condition in which you feel small, irregular tightenings of the muscles in the womb (contractions) that eventually go away. These are called Braxton Hicks contractions. Contractions may last for hours, days, or even weeks before true labor sets in.  Signs of labor can include: abdominal cramps; regular contractions that start at 10 minutes apart and become stronger and more frequent with time; watery or bloody mucus discharge that comes from the vagina; increased pelvic pressure and dull back pain; and leaking of amniotic fluid. This information is not intended to replace advice  given to you by your health care provider. Make sure you discuss any questions you have with your health care provider. Document Released: 05/08/2001 Document Revised: 10/20/2015 Document Reviewed: 07/15/2012 Elsevier Interactive Patient Education  2017 [employer] Inc[employer]  

## 2016-11-27 ENCOUNTER — Emergency Department (HOSPITAL_COMMUNITY)
Admission: EM | Admit: 2016-11-27 | Discharge: 2016-11-27 | Discharge status: Against Medical Advice | Payer: Medicaid Other | Source: Home / Self Care

## 2016-11-27 ENCOUNTER — Encounter (HOSPITAL_COMMUNITY): Payer: Self-pay

## 2016-11-27 ENCOUNTER — Inpatient Hospital Stay (HOSPITAL_COMMUNITY)
Admission: EM | Admit: 2016-11-27 | Discharge: 2016-11-27 | Discharge status: Against Medical Advice | Payer: Medicaid Other | Attending: Family Medicine | Admitting: Family Medicine

## 2016-11-27 ENCOUNTER — Telehealth: Payer: Self-pay | Admitting: *Deleted

## 2016-11-27 DIAGNOSIS — Z3A38 38 weeks gestation of pregnancy: Secondary | ICD-10-CM | POA: Insufficient documentation

## 2016-11-27 DIAGNOSIS — O479 False labor, unspecified: Secondary | ICD-10-CM

## 2016-11-27 LAB — URINALYSIS, ROUTINE W REFLEX MICROSCOPIC
Bilirubin Urine: NEGATIVE
GLUCOSE, UA: NEGATIVE mg/dL
Hgb urine dipstick: NEGATIVE
KETONES UR: NEGATIVE mg/dL
Nitrite: NEGATIVE
PH: 7 (ref 5.0–8.0)
Protein, ur: NEGATIVE mg/dL
Specific Gravity, Urine: 1.008 (ref 1.005–1.030)

## 2016-11-27 NOTE — Progress Notes (Signed)
I have communicated with Shannon DikeJennifer Rasch,Shannon Randolph and reviewed vital signs:  Vitals:   11/27/16 1119 11/27/16 1149  BP: 112/68 97/75  Pulse: (!) 120 (!) 110  Resp: 18   Temp: 98.4 F (36.9 C)     Vaginal exam:  Dilation: Closed Effacement (%): Thick Cervical Position: Posterior Station: -3 Exam by:: Shannon Randolph,rnc,   Also reviewed contraction pattern and that non-stress test is reactive.  It has been documented that patient is not contracting. Pt's cervix is closed not indicating active labor.  Patient denies any other complaints.  Based on this report provider has given order for discharge.  A discharge order and diagnosis entered by a provider.   Labor discharge instructions reviewed with patient.

## 2016-11-27 NOTE — Discharge Instructions (Signed)

## 2016-11-27 NOTE — ED Triage Notes (Signed)
Called pt for triage multiple times, no answer.

## 2016-11-27 NOTE — Telephone Encounter (Signed)
Patient walked into nurse clinic for triage. Pt stated she was having contractions every 10 minutes and [facility] The ED department told her to come see her PCP.  Patient should have been told to go to [facility].  Advised patient that she should have went to [facility], patient asked if she wanted EMS to transport her there, patient stated I am calling someone to come get me.  Then she left stating she was going to drive herself.  Contractions started a hour ago per patient.  Precept with Dr. Leveda AnnaHensel agreed patient need to be seen at [facility].  Clovis PuMartin, Tamika L, RN

## 2016-11-27 NOTE — [facility] Note (Signed)
Contractions started about 90min ago Coming every .  Denies bleeding or leaking.

## 2016-12-06 ENCOUNTER — Other Ambulatory Visit (HOSPITAL_COMMUNITY)
Admission: RE | Admit: 2016-12-06 | Discharge: 2016-12-06 | Disposition: A | Discharge status: Home / Self Care | Payer: Medicaid Other | Source: Ambulatory Visit | Attending: Family Medicine | Admitting: Family Medicine

## 2016-12-06 ENCOUNTER — Ambulatory Visit (INDEPENDENT_AMBULATORY_CARE_PROVIDER_SITE_OTHER): Payer: Medicaid Other | Admitting: Internal Medicine

## 2016-12-06 VITALS — BP 118/62 | HR 77 | Temp 98.8°F | Wt 174.0 lb

## 2016-12-06 DIAGNOSIS — Z3403 Encounter for supervision of normal first pregnancy, third trimester: Secondary | ICD-10-CM | POA: Insufficient documentation

## 2016-12-06 LAB — OB RESULTS CONSOLE GC/CHLAMYDIA
CHLAMYDIA, DNA PROBE: NEGATIVE
Gonorrhea: NEGATIVE

## 2016-12-06 LAB — OB RESULTS CONSOLE GBS: GBS: NEGATIVE

## 2016-12-06 MED ORDER — PRE-NATAL FORMULA PO TABS: 1.0000 | ORAL_TABLET | Freq: Every day | ORAL | 11 refills | Status: DC

## 2016-12-06 NOTE — Patient Instructions (Signed)

## 2016-12-06 NOTE — Progress Notes (Signed)
Darl HouseholderRashanda Ildefonso is a 26 y.o. [id number] at 6448w2d for routine follow up.  She reports going to [facility] on July 3rd - was having Braxton Hicks contraction, no issues or contractions since then. No vaginal bleeding, No LOF, Fetal Movement, No contractions   See flow sheet for details.  A/P: Pregnancy at 4648w2d.  Doing well.   Pregnancy issues include none   Infant feeding choice: breast and formula Contraception choice: Was considering an BTL, however decided to go with Depo  Infant circumcision desired yes GBS/GC/CZ were performed today Labor precautions reviewed. Kick counts reviewed.

## 2016-12-07 LAB — CERVICOVAGINAL ANCILLARY ONLY
Chlamydia: NEGATIVE
NEISSERIA GONORRHEA: NEGATIVE

## 2016-12-11 ENCOUNTER — Ambulatory Visit (INDEPENDENT_AMBULATORY_CARE_PROVIDER_SITE_OTHER): Payer: Medicaid Other | Admitting: Internal Medicine

## 2016-12-11 VITALS — BP 118/68 | HR 66 | Wt 177.0 lb

## 2016-12-11 DIAGNOSIS — Z3403 Encounter for supervision of normal first pregnancy, third trimester: Secondary | ICD-10-CM

## 2016-12-11 LAB — CULTURE, BETA STREP (GROUP B ONLY): STREP GP B CULTURE: NEGATIVE

## 2016-12-11 NOTE — Progress Notes (Signed)
Shannon HouseholderRashanda Randolph is a 26 y.o. [id number] at 3732w0d for routine follow up.  She reports no vaginal bleeding, positive fetal movement, Braxton-Hicks contraction last night for about 2 hour, no vaginal discharge.  See flow sheet for details.  A/P: Pregnancy at 4932w0d.  Doing well.   Pregnancy issues include none  Cervical exam was performed 1:30%: vertex  Infant feeding choice: breast and formula Contraception choice:Depo  Infant circumcision desired yes  GBS/GC/CZ results were reviewed today.   Labor precautions reviewed. Kick counts reviewed. 1 week follow up

## 2016-12-11 NOTE — Patient Instructions (Signed)

## 2016-12-19 ENCOUNTER — Ambulatory Visit (INDEPENDENT_AMBULATORY_CARE_PROVIDER_SITE_OTHER): Payer: Medicaid Other | Admitting: Student

## 2016-12-19 VITALS — BP 112/54 | HR 108 | Temp 98.0°F | Wt 182.0 lb

## 2016-12-19 DIAGNOSIS — N898 Other specified noninflammatory disorders of vagina: Secondary | ICD-10-CM | POA: Diagnosis not present

## 2016-12-19 LAB — POCT WET PREP (WET MOUNT)
Clue Cells Wet Prep Whiff POC: NEGATIVE
TRICHOMONAS WET PREP HPF POC: ABSENT

## 2016-12-19 LAB — POCT FERNING: FERNING, POC: NEGATIVE

## 2016-12-19 NOTE — Progress Notes (Signed)
Shannon HouseholderRashanda Randolph is a 26 y.o. [id number] at 5849w1d for routine follow up.  She reports whitish discharge, sometimes watery for the last two weeks. This has not changed. Denies itching, vaginal bleeding, dysuria or fever. Admits cramping pain that happens once a night. Lasts about 30 minutes. Fetal movement is as usual.    See flow sheet for details. Vitals:   12/19/16 1609  BP: (!) 112/54  Pulse: (!) 108  Temp: 98 F (36.7 C)  Weight: 182 lb (82.6 kg)   Orders Placed This Encounter  Procedures  . POCT Wet Prep [employer](Wet [place])  . POCT Ferning  Pelvic exam:  No suprapubic External genitalia: normal without surrounding skin lesion, obvious discharge or bleeeding.  Speculum: pink vaginal mucosa, ruggated, normal cervix. Abundant whitish discharge. No pooling or bleeding Manual exam: cervix about 1 cm open and 40% effaced.   A/P: Pregnancy at 4349w1d.  Doing well.   Pregnancy issues include: none Vaginal discharge: Abundant whitish discharge on speculum. No pooling or bleeding. Wet prep and ferning negative.  Infant feeding choice: Breast milk and formula Contraception choice: Depo Infant circumcision desired yes GBS/GC/CZ results  were reviewed today.   Labor precautions reviewed. Kick counts reviewed. Follow up in a week. Labor symptoms and general obstetric precautions including but not limited to vaginal bleeding, contractions, leaking of fluid and fetal movement were reviewed in detail with the patient.

## 2016-12-19 NOTE — Patient Instructions (Signed)
Third Trimester of Pregnancy The third trimester is from week 28 through week 40 (months 7 through 9). The third trimester is a time when the unborn baby (fetus) is growing rapidly. At the end of the ninth month, the fetus is about 20 inches in length and weighs 6-10 pounds. Body changes during your third trimester Your body will continue to go through many changes during pregnancy. The changes vary from woman to woman. During the third trimester:  Your weight will continue to increase. You can expect to gain 25-35 pounds (11-16 kg) by the end of the pregnancy.  You may begin to get stretch marks on your hips, abdomen, and breasts.  You may urinate more often because the fetus is moving lower into your pelvis and pressing on your bladder.  You may develop or continue to have heartburn. This is caused by increased hormones that slow down muscles in the digestive tract.  You may develop or continue to have constipation because increased hormones slow digestion and cause the muscles that push waste through your intestines to relax.  You may develop hemorrhoids. These are swollen veins (varicose veins) in the rectum that can itch or be painful.  You may develop swollen, bulging veins (varicose veins) in your legs.  You may have increased body aches in the pelvis, back, or thighs. This is due to weight gain and increased hormones that are relaxing your joints.  You may have changes in your hair. These can include thickening of your hair, rapid growth, and changes in texture. Some women also have hair loss during or after pregnancy, or hair that feels dry or thin. Your hair will most likely return to normal after your baby is born.  Your breasts will continue to grow and they will continue to become tender. A yellow fluid (colostrum) may leak from your breasts. This is the first milk you are producing for your baby.  Your belly button may stick out.  You may notice more swelling in your hands,  face, or ankles.  You may have increased tingling or numbness in your hands, arms, and legs. The skin on your belly may also feel numb.  You may feel short of breath because of your expanding uterus.  You may have more problems sleeping. This can be caused by the size of your belly, increased need to urinate, and an increase in your body's metabolism.  You may notice the fetus "dropping," or moving lower in your abdomen (lightening).  You may have increased vaginal discharge.  You may notice your joints feel loose and you may have pain around your pelvic bone.  What to expect at prenatal visits You will have prenatal exams every 2 weeks until week 36. Then you will have weekly prenatal exams. During a routine prenatal visit:  You will be weighed to make sure you and the baby are growing normally.  Your blood pressure will be taken.  Your abdomen will be measured to track your baby's growth.  The fetal heartbeat will be listened to.  Any test results from the previous visit will be discussed.  You may have a cervical check near your due date to see if your cervix has softened or thinned (effaced).  You will be tested for Group B streptococcus. This happens between 35 and 37 weeks.  Your health care provider may ask you:  What your birth plan is.  How you are feeling.  If you are feeling the baby move.  If you have had   any abnormal symptoms, such as leaking fluid, bleeding, severe headaches, or abdominal cramping.  If you are using any tobacco products, including cigarettes, chewing tobacco, and electronic cigarettes.  If you have any questions.  Other tests or screenings that may be performed during your third trimester include:  Blood tests that check for low iron levels (anemia).  Fetal testing to check the health, activity level, and growth of the fetus. Testing is done if you have certain medical conditions or if there are problems during the  pregnancy.  Nonstress test (NST). This test checks the health of your baby to make sure there are no signs of problems, such as the baby not getting enough oxygen. During this test, a belt is placed around your belly. The baby is made to move, and its heart rate is monitored during movement.  What is false labor? False labor is a condition in which you feel small, irregular tightenings of the muscles in the womb (contractions) that usually go away with rest, changing position, or drinking water. These are called Braxton Hicks contractions. Contractions may last for hours, days, or even weeks before true labor sets in. If contractions come at regular intervals, become more frequent, increase in intensity, or become painful, you should see your health care provider. What are the signs of labor?  Abdominal cramps.  Regular contractions that start at 10 minutes apart and become stronger and more frequent with time.  Contractions that start on the top of the uterus and spread down to the lower abdomen and back.  Increased pelvic pressure and dull back pain.  A watery or bloody mucus discharge that comes from the vagina.  Leaking of amniotic fluid. This is also known as your "water breaking." It could be a slow trickle or a gush. Let your health care provider know if it has a color or strange odor. If you have any of these signs, call your health care provider right away, even if it is before your due date. Follow these instructions at home: Medicines  Follow your health care provider's instructions regarding medicine use. Specific medicines may be either safe or unsafe to take during pregnancy.  Take a prenatal vitamin that contains at least 600 micrograms (mcg) of folic acid.  If you develop constipation, try taking a stool softener if your health care provider approves. Eating and drinking  Eat a balanced diet that includes fresh fruits and vegetables, whole grains, good sources of protein  such as meat, eggs, or tofu, and low-fat dairy. Your health care provider will help you determine the amount of weight gain that is right for you.  Avoid raw meat and uncooked cheese. These carry germs that can cause birth defects in the baby.  If you have low calcium intake from food, talk to your health care provider about whether you should take a daily calcium supplement.  Eat four or five small meals rather than three large meals a day.  Limit foods that are high in fat and processed sugars, such as fried and sweet foods.  To prevent constipation: ? Drink enough fluid to keep your urine clear or pale yellow. ? Eat foods that are high in fiber, such as fresh fruits and vegetables, whole grains, and beans. Activity  Exercise only as directed by your health care provider. Most women can continue their usual exercise routine during pregnancy. Try to exercise for 30 minutes at least 5 days a week. Stop exercising if you experience uterine contractions.  Avoid heavy   lifting.  Do not exercise in extreme heat or humidity, or at high altitudes.  Wear low-heel, comfortable shoes.  Practice good posture.  You may continue to have sex unless your health care provider tells you otherwise. Relieving pain and discomfort  Take frequent breaks and rest with your legs elevated if you have leg cramps or low back pain.  Take warm sitz baths to soothe any pain or discomfort caused by hemorrhoids. Use hemorrhoid cream if your health care provider approves.  Wear a good support bra to prevent discomfort from breast tenderness.  If you develop varicose veins: ? Wear support pantyhose or compression stockings as told by your healthcare provider. ? Elevate your feet for 15 minutes, 3-4 times a day. Prenatal care  Write down your questions. Take them to your prenatal visits.  Keep all your prenatal visits as told by your health care provider. This is important. Safety  Wear your seat belt at  all times when driving.  Make a list of emergency phone numbers, including numbers for family, friends, the hospital, and police and fire departments. General instructions  Avoid cat litter boxes and soil used by cats. These carry germs that can cause birth defects in the baby. If you have a cat, ask someone to clean the litter box for you.  Do not travel far distances unless it is absolutely necessary and only with the approval of your health care provider.  Do not use hot tubs, steam rooms, or saunas.  Do not drink alcohol.  Do not use any products that contain nicotine or tobacco, such as cigarettes and e-cigarettes. If you need help quitting, ask your health care provider.  Do not use any medicinal herbs or unprescribed drugs. These chemicals affect the formation and growth of the baby.  Do not douche or use tampons or scented sanitary pads.  Do not cross your legs for long periods of time.  To prepare for the arrival of your baby: ? Take prenatal classes to understand, practice, and ask questions about labor and delivery. ? Make a trial run to the hospital. ? Visit the hospital and tour the maternity area. ? Arrange for maternity or paternity leave through employers. ? Arrange for family and friends to take care of pets while you are in the hospital. ? Purchase a rear-facing car seat and make sure you know how to install it in your car. ? Pack your hospital bag. ? Prepare the baby's nursery. Make sure to remove all pillows and stuffed animals from the baby's crib to prevent suffocation.  Visit your dentist if you have not gone during your pregnancy. Use a soft toothbrush to brush your teeth and be gentle when you floss. Contact a health care provider if:  You are unsure if you are in labor or if your water has broken.  You become dizzy.  You have mild pelvic cramps, pelvic pressure, or nagging pain in your abdominal area.  You have lower back pain.  You have persistent  nausea, vomiting, or diarrhea.  You have an unusual or bad smelling vaginal discharge.  You have pain when you urinate. Get help right away if:  Your water breaks before 37 weeks.  You have regular contractions less than 5 minutes apart before 37 weeks.  You have a fever.  You are leaking fluid from your vagina.  You have spotting or bleeding from your vagina.  You have severe abdominal pain or cramping.  You have rapid weight loss or weight gain.    You have shortness of breath with chest pain.  You notice sudden or extreme swelling of your face, hands, ankles, feet, or legs.  Your baby makes fewer than 10 movements in 2 hours.  You have severe headaches that do not go away when you take medicine.  You have vision changes. Summary  The third trimester is from week 28 through week 40, months 7 through 9. The third trimester is a time when the unborn baby (fetus) is growing rapidly.  During the third trimester, your discomfort may increase as you and your baby continue to gain weight. You may have abdominal, leg, and back pain, sleeping problems, and an increased need to urinate.  During the third trimester your breasts will keep growing and they will continue to become tender. A yellow fluid (colostrum) may leak from your breasts. This is the first milk you are producing for your baby.  False labor is a condition in which you feel small, irregular tightenings of the muscles in the womb (contractions) that eventually go away. These are called Braxton Hicks contractions. Contractions may last for hours, days, or even weeks before true labor sets in.  Signs of labor can include: abdominal cramps; regular contractions that start at 10 minutes apart and become stronger and more frequent with time; watery or bloody mucus discharge that comes from the vagina; increased pelvic pressure and dull back pain; and leaking of amniotic fluid. This information is not intended to replace advice  given to you by your health care provider. Make sure you discuss any questions you have with your health care provider. Document Released: 05/08/2001 Document Revised: 10/20/2015 Document Reviewed: 07/15/2012 Elsevier Interactive Patient Education  2017 [employer] Inc[employer]  

## 2016-12-25 ENCOUNTER — Encounter (HOSPITAL_COMMUNITY): Payer: Self-pay | Admitting: *Deleted

## 2016-12-25 ENCOUNTER — Inpatient Hospital Stay (EMERGENCY_DEPARTMENT_HOSPITAL)
Admission: AD | Admit: 2016-12-25 | Discharge: 2016-12-25 | Disposition: A | Discharge status: Home / Self Care | Payer: Medicaid Other | Source: Ambulatory Visit | Attending: Family Medicine | Admitting: Family Medicine

## 2016-12-25 DIAGNOSIS — M549 Dorsalgia, unspecified: Secondary | ICD-10-CM | POA: Insufficient documentation

## 2016-12-25 DIAGNOSIS — Z87891 Personal history of nicotine dependence: Secondary | ICD-10-CM

## 2016-12-25 DIAGNOSIS — O2693 Pregnancy related conditions, unspecified, third trimester: Secondary | ICD-10-CM | POA: Diagnosis not present

## 2016-12-25 DIAGNOSIS — O99891 Other specified diseases and conditions complicating pregnancy: Secondary | ICD-10-CM

## 2016-12-25 DIAGNOSIS — Z3A40 40 weeks gestation of pregnancy: Secondary | ICD-10-CM

## 2016-12-25 DIAGNOSIS — M5431 Sciatica, right side: Secondary | ICD-10-CM | POA: Diagnosis not present

## 2016-12-25 DIAGNOSIS — O26893 Other specified pregnancy related conditions, third trimester: Secondary | ICD-10-CM

## 2016-12-25 DIAGNOSIS — O9989 Other specified diseases and conditions complicating pregnancy, childbirth and the puerperium: Secondary | ICD-10-CM

## 2016-12-25 MED ORDER — CYCLOBENZAPRINE HCL 5 MG PO TABS
5.0000 mg | ORAL_TABLET | Freq: Once | ORAL | Status: AC
  Administered 2016-12-25: 5 mg via ORAL
  Filled 2016-12-25: qty 1

## 2016-12-25 MED ORDER — CYCLOBENZAPRINE HCL 10 MG PO TABS: 10.0000 mg | ORAL_TABLET | Freq: Two times a day (BID) | ORAL | 0 refills | Status: DC | PRN

## 2016-12-25 NOTE — Discharge Instructions (Signed)
Sciatica Sciatica is pain, numbness, weakness, or tingling along your sciatic nerve. The sciatic nerve starts in the lower back and goes down the back of each leg. Sciatica happens when this nerve is pinched or has pressure put on it. Sciatica usually goes away on its own or with treatment. Sometimes, sciatica may keep coming back (recur). Follow these instructions at home: Medicines  Take over-the-counter and prescription medicines only as told by your doctor.  Do not drive or use heavy machinery while taking prescription pain medicine. Managing pain  If directed, put ice on the affected area. ? Put ice in a plastic bag. ? Place a towel between your skin and the bag. ? Leave the ice on for 20 minutes, 2-3 times a day.  After icing, apply heat to the affected area before you exercise or as often as told by your doctor. Use the heat source that your doctor tells you to use, such as a moist heat pack or a heating pad. ? Place a towel between your skin and the heat source. ? Leave the heat on for 20-30 minutes. ? Remove the heat if your skin turns bright red. This is especially important if you are unable to feel pain, heat, or cold. You may have a greater risk of getting burned. Activity  Return to your normal activities as told by your doctor. Ask your doctor what activities are safe for you. ? Avoid activities that make your sciatica worse.  Take short rests during the day. Rest in a lying or standing position. This is usually better than sitting to rest. ? When you rest for a long time, do some physical activity or stretching between periods of rest. ? Avoid sitting for a long time without moving. Get up and move around at least one time each hour.  Exercise and stretch regularly, as told by your doctor.  Do not lift anything that is heavier than 10 lb (4.5 kg) while you have symptoms of sciatica. ? Avoid lifting heavy things even when you do not have symptoms. ? Avoid lifting heavy  things over and over.  When you lift objects, always lift in a way that is safe for your body. To do this, you should: ? Bend your knees. ? Keep the object close to your body. ? Avoid twisting. General instructions  Use good posture. ? Avoid leaning forward when you are sitting. ? Avoid hunching over when you are standing.  Stay at a healthy weight.  Wear comfortable shoes that support your feet. Avoid wearing high heels.  Avoid sleeping on a mattress that is too soft or too hard. You might have less pain if you sleep on a mattress that is firm enough to support your back.  Keep all follow-up visits as told by your doctor. This is important. Contact a doctor if:  You have pain that: ? Wakes you up when you are sleeping. ? Gets worse when you lie down. ? Is worse than the pain you have had in the past. ? Lasts longer than 4 weeks.  You lose weight for without trying. Get help right away if:  You cannot control when you pee (urinate) or poop (have a bowel movement).  You have weakness in any of these areas and it gets worse. ? Lower back. ? Lower belly (pelvis). ? Butt (buttocks). ? Legs.  You have redness or swelling of your back.  You have a burning feeling when you pee. This information is not intended to replace  advice given to you by your health care provider. Make sure you discuss any questions you have with your health care provider. Document Released: 02/21/2008 Document Revised: 10/20/2015 Document Reviewed: 01/21/2015 Elsevier Interactive Patient Education  2018 [employer] Back Pain in Pregnancy Back pain during pregnancy is common. Back pain may be caused by several factors that are related to changes during your pregnancy. Follow these instructions at home: Managing pain, stiffness, and swelling  If directed, apply ice for sudden (acute) back pain. ? Put ice in a plastic bag. ? Place a towel between your skin and the bag. ? Leave the ice on for 20  minutes, 2-3 times per day.  If directed, apply heat to the affected area before you exercise: ? Place a towel between your skin and the heat pack or heating pad. ? Leave the heat on for 20-30 minutes. ? Remove the heat if your skin turns bright red. This is especially important if you are unable to feel pain, heat, or cold. You may have a greater risk of getting burned. Activity  Exercise as told by your health care provider. Exercising is the best way to prevent or manage back pain.  Listen to your body when lifting. If lifting hurts, ask for help or bend your knees. This uses your leg muscles instead of your back muscles.  Squat down when picking up something from the floor. Do not bend over.  Only use bed rest as told by your health care provider. Bed rest should only be used for the most severe episodes of back pain. Standing, Sitting, and Lying Down  Do not stand in one place for long periods of time.  Use good posture when sitting. Make sure your head rests over your shoulders and is not hanging forward. Use a pillow on your lower back if necessary.  Try sleeping on your side, preferably the left side, with a pillow or two between your legs. If you are sore after a night's rest, your bed may be too soft. A firm mattress may provide more support for your back during pregnancy. General instructions  Do not wear high heels.  Eat a healthy diet. Try to gain weight within your health care provider's recommendations.  Use a maternity girdle, elastic sling, or back brace as told by your health care provider.  Take over-the-counter and prescription medicines only as told by your health care provider.  Keep all follow-up visits as told by your health care provider. This is important. This includes any visits with any specialists, such as a physical therapist. Contact a health care provider if:  Your back pain interferes with your daily activities.  You have increasing pain in other  parts of your body. Get help right away if:  You develop numbness, tingling, weakness, or problems with the use of your arms or legs.  You develop severe back pain that is not controlled with medicine.  You have a sudden change in bowel or bladder control.  You develop shortness of breath, dizziness, or you faint.  You develop nausea, vomiting, or sweating.  You have back pain that is a rhythmic, cramping pain similar to labor pains. Labor pain is usually 1-2 minutes apart, lasts for about 1 minute, and involves a bearing down feeling or pressure in your pelvis.  You have back pain and your water breaks or you have vaginal bleeding.  You have back pain or numbness that travels down your leg.  Your back pain developed after you fell.  You develop pain on one side of your back.  You see blood in your urine.  You develop skin blisters in the area of your back pain. This information is not intended to replace advice given to you by your health care provider. Make sure you discuss any questions you have with your health care provider. Document Released: 08/22/2005 Document Revised: 10/20/2015 Document Reviewed: 01/26/2015 Elsevier Interactive Patient Education  [employer].

## 2016-12-25 NOTE — MAU Provider Note (Signed)
History     CSN: [id number]  Arrival date and time: 12/25/16 1120   First Provider Initiated Contact with Patient 12/25/16 1207      Chief Complaint  Patient presents with  . numbness in legs  . Pelvic Pain   HPI  Ms. Shannon Randolph is a 26 y.o. [id number] at 6087w0d who presents to MAU today with complaint of back pain and numbness in her right leg x 2-3 days. The patient states that she has tried a warm bath, but has not taken anything for pain and is not using an abdominal binder. She reports good fetal movement and denies abnormal discharge, LOF, vaginal bleeding, regular contractions today.    OB History    Gravida Para Term Preterm AB Living   7 2 2   4 2    SAB TAB Ectopic Multiple Live Births           2      Past Medical History:  Diagnosis Date  . Bacterial vaginosis   . Infection    UTI, gonorrhea  . No pertinent past medical history     Past Surgical History:  Procedure Laterality Date  . NO PAST SURGERIES      Family History  Problem Relation Age of Onset  . Hearing loss Neg Hx     Social History  Substance Use Topics  . Smoking status: Former Smoker    Quit date: 01/11/2012  . Smokeless tobacco: Former NeurosurgeonUser    Quit date: 01/11/2012  . Alcohol use No    Allergies: No Known Allergies  Prescriptions Prior to Admission  Medication Sig Dispense Refill Last Dose  . Prenatal Multivit-Min-Fe-FA (PRE-NATAL FORMULA) TABS Take 1 tablet by mouth daily. 30 each 11 12/25/2016 at Unknown time    Review of Systems  Constitutional: Negative for fever.  Gastrointestinal: Negative for abdominal pain, constipation, diarrhea, nausea and vomiting.  Genitourinary: Positive for vaginal discharge. Negative for dysuria, frequency, urgency and vaginal bleeding.  Musculoskeletal: Positive for back pain.   Physical Exam   Blood pressure 109/69, pulse 88, temperature 98.4 F (36.9 C), temperature source Oral, resp. rate 16, weight 179 lb (81.2 kg), last menstrual  period 02/26/2016, SpO2 100 %, unknown if currently breastfeeding.  Physical Exam  Nursing note and vitals reviewed. Constitutional: She is oriented to person, place, and time. She appears well-developed and well-nourished. No distress.  HENT:  Head: Normocephalic and atraumatic.  Cardiovascular: Normal rate and intact distal pulses.   Respiratory: Effort normal.  GI: Soft. She exhibits no distension. There is no tenderness.  Musculoskeletal: Normal range of motion. She exhibits no edema.  Neurological: She is alert and oriented to person, place, and time. She has normal strength.  Negative straight let raise  Skin: Skin is warm and dry. No erythema.  Psychiatric: She has a normal mood and affect.     Fetal Monitoring: Baseline: 130 bpm Variability: moderate Accelerations: 15 x 15 Decelerations: none Contractions: few, irregular    MAU Course  Procedures None  MDM Flexeril given for pain. Patient reports improvement in pain prior to discharge.   Assessment and Plan  A: SIUP at 387w0d Back pain in pregnancy, third trimester   P: Discharge home Rx for Flexeril given to patient  Discussed use of warm bath/shower, heat on her back, abdominal binder and stretching Labor precautions discussed Patient advised to follow-up with [facility] as planned for routine prenatal care Patient may return to MAU as needed or if her condition were to change  or worsen   Vonzella NippleJulie Wenzel, PA-C 12/25/2016, 1:04 PM

## 2016-12-25 NOTE — MAU Note (Signed)
Legs is real, real numb and keeps having pain in her private area.  Started last night.  Back has been hurting , hasn't been able to sleep

## 2016-12-26 ENCOUNTER — Encounter (HOSPITAL_COMMUNITY): Payer: Self-pay

## 2016-12-26 ENCOUNTER — Inpatient Hospital Stay (HOSPITAL_COMMUNITY)
Admission: AD | Admit: 2016-12-26 | Discharge: 2016-12-29 | DRG: 775 | Disposition: A | Discharge status: Home / Self Care | Payer: Medicaid Other | Source: Ambulatory Visit | Attending: Obstetrics & Gynecology | Admitting: Obstetrics & Gynecology

## 2016-12-26 DIAGNOSIS — Z3A40 40 weeks gestation of pregnancy: Secondary | ICD-10-CM | POA: Diagnosis not present

## 2016-12-26 DIAGNOSIS — O480 Post-term pregnancy: Secondary | ICD-10-CM | POA: Diagnosis not present

## 2016-12-26 DIAGNOSIS — Z3493 Encounter for supervision of normal pregnancy, unspecified, third trimester: Secondary | ICD-10-CM | POA: Diagnosis present

## 2016-12-26 DIAGNOSIS — Z87891 Personal history of nicotine dependence: Secondary | ICD-10-CM

## 2016-12-26 MED ORDER — OXYCODONE-ACETAMINOPHEN 5-325 MG PO TABS: 2.0000 | ORAL_TABLET | ORAL | Status: DC | PRN

## 2016-12-26 MED ORDER — OXYTOCIN 40 UNITS IN LACTATED RINGERS INFUSION - SIMPLE MED
2.5000 [IU]/h | INTRAVENOUS | Status: DC
  Filled 2016-12-26: qty 1000

## 2016-12-26 MED ORDER — ACETAMINOPHEN 325 MG PO TABS
650.0000 mg | ORAL_TABLET | ORAL | Status: DC | PRN
Start: 2016-12-26 — End: 2016-12-27

## 2016-12-26 MED ORDER — FLEET ENEMA 7-19 GM/118ML RE ENEM: 1.0000 | ENEMA | RECTAL | Status: DC | PRN

## 2016-12-26 MED ORDER — OXYCODONE-ACETAMINOPHEN 5-325 MG PO TABS: 1.0000 | ORAL_TABLET | ORAL | Status: DC | PRN

## 2016-12-26 MED ORDER — ONDANSETRON HCL 4 MG/2ML IJ SOLN: 4.0000 mg | Freq: Four times a day (QID) | INTRAMUSCULAR | Status: DC | PRN

## 2016-12-26 MED ORDER — LIDOCAINE HCL (PF) 1 % IJ SOLN
30.0000 mL | INTRAMUSCULAR | Status: DC | PRN
  Filled 2016-12-26: qty 30

## 2016-12-26 MED ORDER — OXYTOCIN BOLUS FROM INFUSION
500.0000 mL | Freq: Once | INTRAVENOUS | Status: AC
  Administered 2016-12-27: 500 mL via INTRAVENOUS

## 2016-12-26 MED ORDER — FENTANYL CITRATE (PF) 100 MCG/2ML IJ SOLN
100.0000 ug | INTRAMUSCULAR | Status: DC | PRN
  Administered 2016-12-27 (×2): 100 ug via INTRAVENOUS
  Filled 2016-12-26 (×2): qty 2

## 2016-12-26 MED ORDER — LACTATED RINGERS IV SOLN: INTRAVENOUS | Status: DC

## 2016-12-26 MED ORDER — LACTATED RINGERS IV SOLN: 500.0000 mL | INTRAVENOUS | Status: DC | PRN

## 2016-12-26 MED ORDER — SOD CITRATE-CITRIC ACID 500-334 MG/5ML PO SOLN: 30.0000 mL | ORAL | Status: DC | PRN

## 2016-12-26 NOTE — H&P (Signed)
LABOR ADMISSION HISTORY AND PHYSICAL  Shannon Randolph is a 26 y.o. female [phone] with IUP at 9765w1d by early US presenting for SOL. She reports +FMs, No LOF, no VB, no blurry vision, no headaches, no peripheral edema, and no RUQ pain.  She plans on bottle feeding. She requests Depo for birth control.  Dating: By early [place] --->  Estimated Date of Delivery: 12/25/16  Sono:   @[redacted]w[redacted]d , CWD, normal anatomy, breech presentation, 263g, 42% EFW  Prenatal History/Complications:  Past Medical History: Past Medical History:  Diagnosis Date  . Bacterial vaginosis   . Infection    UTI, gonorrhea  . No pertinent past medical history     Past Surgical History: Past Surgical History:  Procedure Laterality Date  . NO PAST SURGERIES      Obstetrical History: OB History    Gravida Para Term Preterm AB Living   7 2 2   4 2    SAB TAB Ectopic Multiple Live Births           2      Social History: Social History   Social History  . Marital status: Single    Spouse name: N/A  . Number of children: N/A  . Years of education: N/A   Social History Main Topics  . Smoking status: Former Smoker    Quit date: 01/11/2012  . Smokeless tobacco: Former NeurosurgeonUser    Quit date: 01/11/2012  . Alcohol use No  . Drug use: No  . Sexual activity: Yes    Birth control/ protection: None   Other Topics Concern  . None   Social History Narrative  . None    Family History: Family History  Problem Relation Age of Onset  . Hearing loss Neg Hx     Allergies: No Known Allergies  Prescriptions Prior to Admission  Medication Sig Dispense Refill Last Dose  . cyclobenzaprine (FLEXERIL) 10 MG tablet Take 1 tablet (10 mg total) by mouth 2 (two) times daily as needed for muscle spasms. 20 tablet 0   . Prenatal Multivit-Min-Fe-FA (PRE-NATAL FORMULA) TABS Take 1 tablet by mouth daily. 30 each 11 12/25/2016 at Unknown time     Review of Systems   All systems reviewed and negative except as stated in  HPI  Blood pressure 120/75, pulse 95, temperature 98.1 F (36.7 C), resp. rate 18, last menstrual period 02/26/2016, unknown if currently breastfeeding. General appearance: alert, cooperative and moderate distress with contractions Lungs: normal work of breathing Extremities: Homans sign is negative, no sign of DVT Presentation: cephalic Fetal monitoringBaseline: 145 bpm, Variability: Good {> 6 bpm), Accelerations: Reactive and Decelerations: Absent Uterine activityFrequency: Every 2-3 minutes Dilation: 5 Effacement (%): 80 Station: -1, -2 Exam by:: Waymon BudgeBrittany Bowen, RN   Prenatal labs: ABO, Rh: B/POS/-- (12/01 1056) Antibody: NEG (12/01 1056) Rubella: Immune RPR: Non Reactive (06/07 1623)  HBsAg: NEGATIVE (12/01 1056)  HIV: NONREACTIVE (12/01 1056)  GBS:  negative  Glucola1 hr- 124  Prenatal Transfer Tool  Maternal Diabetes: No Genetic Screening: Normal Maternal Ultrasounds/Referrals: Normal Fetal Ultrasounds or other Referrals:  None Maternal Substance Abuse:  No Significant Maternal Medications:  None Significant Maternal Lab Results: Lab values include: Group B Strep negative  No results found for this or any previous visit (from the past 24 hour(s)).  Patient Active Problem List   Diagnosis Date Noted  . Abnormal Pap smear of cervix 07/04/2016  . Nausea and vomiting in pregnancy 06/04/2016  . Supervision of normal first pregnancy in third trimester 04/27/2016  .  LBP (low back pain) 10/18/2011  . Vaginal discharge 12/11/2010  . Contraception 11/01/2010  . HIP PAIN, LEFT 02/22/2010  . 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 TOBACCO ABUSE 04/07/2009    Assessment: Shannon Randolph is a 26 y.o. [id number] at 4374w1d here for SOL  #Labor: Expectant management #Pain: Epidural upon request, would like IV pain meds now #FWB: Cat 1 #ID: GBS negative #MOF: Bottle #MOC: Depo #Circ: Outpatient  [place] Shirley, DO Family Medicine Resident PGY-1  12/26/2016, 11:33  PM  CNM attestation:  I have seen and examined this patient; I agree with above documentation in the resident's note.   Shannon Randolph is a 26 y.o. [id number] here for SOL  PE: BP 106/73   Pulse 99   Temp 98.7 F (37.1 C) (Oral)   Resp 18   Ht 5\' 3"  (1.6 m)   Wt 84.8 kg (187 lb)   LMP 02/26/2016   SpO2 100%   BMI 33.13 kg/m   Resp: normal effort, no distress Abd: gravid  ROS, labs, PMH reviewed  [facility]: [facility] Expectant management  GBS neg Anticipate SVD  SHAW, KIMBERLY CNM 12/27/2016, 3:06 AM

## 2016-12-26 NOTE — MAU Note (Signed)
Ctx since yesterday. Now closer and more uncomfortable . Denies any vag bleeding or leaking good fetal movement reported

## 2016-12-27 ENCOUNTER — Inpatient Hospital Stay (HOSPITAL_COMMUNITY): Payer: Medicaid Other | Admitting: Anesthesiology

## 2016-12-27 ENCOUNTER — Encounter (HOSPITAL_COMMUNITY): Payer: Self-pay | Admitting: *Deleted

## 2016-12-27 DIAGNOSIS — O480 Post-term pregnancy: Secondary | ICD-10-CM

## 2016-12-27 DIAGNOSIS — Z3A40 40 weeks gestation of pregnancy: Secondary | ICD-10-CM

## 2016-12-27 LAB — CBC
HEMATOCRIT: 35.7 % — AB (ref 36.0–46.0)
Hemoglobin: 11.8 g/dL — ABNORMAL LOW (ref 12.0–15.0)
MCH: 28 pg (ref 26.0–34.0)
MCHC: 33.1 g/dL (ref 30.0–36.0)
MCV: 84.6 fL (ref 78.0–100.0)
PLATELETS: 177 10*3/uL (ref 150–400)
RBC: 4.22 MIL/uL (ref 3.87–5.11)
RDW: 14.1 % (ref 11.5–15.5)
WBC: 14.5 10*3/uL — AB (ref 4.0–10.5)

## 2016-12-27 LAB — RPR: RPR Ser Ql: NONREACTIVE

## 2016-12-27 MED ORDER — DIPHENHYDRAMINE HCL 25 MG PO CAPS: 25.0000 mg | ORAL_CAPSULE | Freq: Four times a day (QID) | ORAL | Status: DC | PRN

## 2016-12-27 MED ORDER — EPHEDRINE 5 MG/ML INJ
10.0000 mg | INTRAVENOUS | Status: DC | PRN
  Filled 2016-12-27: qty 2

## 2016-12-27 MED ORDER — ACETAMINOPHEN 325 MG PO TABS
650.0000 mg | ORAL_TABLET | ORAL | Status: DC | PRN
  Administered 2016-12-28: 650 mg via ORAL
  Filled 2016-12-27: qty 2

## 2016-12-27 MED ORDER — LIDOCAINE HCL (PF) 1 % IJ SOLN
INTRAMUSCULAR | Status: DC | PRN
  Administered 2016-12-27: 4 mL
  Administered 2016-12-27: 6 mL via EPIDURAL

## 2016-12-27 MED ORDER — SIMETHICONE 80 MG PO CHEW: 80.0000 mg | CHEWABLE_TABLET | ORAL | Status: DC | PRN

## 2016-12-27 MED ORDER — IBUPROFEN 600 MG PO TABS
600.0000 mg | ORAL_TABLET | Freq: Four times a day (QID) | ORAL | Status: DC
  Administered 2016-12-27 – 2016-12-29 (×9): 600 mg via ORAL
  Filled 2016-12-27 (×10): qty 1

## 2016-12-27 MED ORDER — COCONUT OIL OIL: 1.0000 "application " | TOPICAL_OIL | Status: DC | PRN

## 2016-12-27 MED ORDER — PHENYLEPHRINE 40 MCG/ML (10ML) SYRINGE FOR IV PUSH (FOR BLOOD PRESSURE SUPPORT)
80.0000 ug | PREFILLED_SYRINGE | INTRAVENOUS | Status: DC | PRN
  Filled 2016-12-27: qty 5

## 2016-12-27 MED ORDER — TETANUS-DIPHTH-ACELL PERTUSSIS 5-2.5-18.5 LF-MCG/0.5 IM SUSP: 0.5000 mL | Freq: Once | INTRAMUSCULAR | Status: DC

## 2016-12-27 MED ORDER — DIPHENHYDRAMINE HCL 50 MG/ML IJ SOLN: 12.5000 mg | INTRAMUSCULAR | Status: DC | PRN

## 2016-12-27 MED ORDER — ZOLPIDEM TARTRATE 5 MG PO TABS: 5.0000 mg | ORAL_TABLET | Freq: Every evening | ORAL | Status: DC | PRN

## 2016-12-27 MED ORDER — FENTANYL 2.5 MCG/ML BUPIVACAINE 1/10 % EPIDURAL INFUSION (WH - ANES)
INTRAMUSCULAR | Status: AC
  Filled 2016-12-27: qty 100

## 2016-12-27 MED ORDER — SENNOSIDES-DOCUSATE SODIUM 8.6-50 MG PO TABS
2.0000 | ORAL_TABLET | ORAL | Status: DC
  Administered 2016-12-27 – 2016-12-29 (×2): 2 via ORAL
  Filled 2016-12-27 (×2): qty 2

## 2016-12-27 MED ORDER — ONDANSETRON HCL 4 MG PO TABS: 4.0000 mg | ORAL_TABLET | ORAL | Status: DC | PRN

## 2016-12-27 MED ORDER — PRENATAL MULTIVITAMIN CH
1.0000 | ORAL_TABLET | Freq: Every day | ORAL | Status: DC
  Administered 2016-12-27 – 2016-12-29 (×3): 1 via ORAL
  Filled 2016-12-27 (×3): qty 1

## 2016-12-27 MED ORDER — WITCH HAZEL-GLYCERIN EX PADS: 1.0000 "application " | MEDICATED_PAD | CUTANEOUS | Status: DC | PRN

## 2016-12-27 MED ORDER — FENTANYL 2.5 MCG/ML BUPIVACAINE 1/10 % EPIDURAL INFUSION (WH - ANES)
14.0000 mL/h | INTRAMUSCULAR | Status: DC | PRN
  Administered 2016-12-27: 14 mL/h via EPIDURAL

## 2016-12-27 MED ORDER — ONDANSETRON HCL 4 MG/2ML IJ SOLN: 4.0000 mg | INTRAMUSCULAR | Status: DC | PRN

## 2016-12-27 MED ORDER — PHENYLEPHRINE 40 MCG/ML (10ML) SYRINGE FOR IV PUSH (FOR BLOOD PRESSURE SUPPORT)
PREFILLED_SYRINGE | INTRAVENOUS | Status: AC
  Filled 2016-12-27: qty 10

## 2016-12-27 MED ORDER — OXYCODONE HCL 5 MG PO TABS
5.0000 mg | ORAL_TABLET | ORAL | Status: DC | PRN
  Administered 2016-12-27 (×3): 5 mg via ORAL
  Filled 2016-12-27 (×3): qty 1

## 2016-12-27 MED ORDER — DIBUCAINE 1 % RE OINT: 1.0000 "application " | TOPICAL_OINTMENT | RECTAL | Status: DC | PRN

## 2016-12-27 MED ORDER — BENZOCAINE-MENTHOL 20-0.5 % EX AERO
1.0000 "application " | INHALATION_SPRAY | CUTANEOUS | Status: DC | PRN
  Filled 2016-12-27: qty 56

## 2016-12-27 MED ORDER — LACTATED RINGERS IV SOLN: 500.0000 mL | Freq: Once | INTRAVENOUS | Status: DC

## 2016-12-27 NOTE — Anesthesia Postprocedure Evaluation (Signed)
Anesthesia Post Note  Patient: Shannon Randolph  Procedure(s) Performed: * No procedures listed *     Patient location during evaluation: Mother Baby Anesthesia Type: Epidural Level of consciousness: awake and alert Pain management: pain level controlled Vital Signs Assessment: post-procedure vital signs reviewed and stable Respiratory status: spontaneous breathing, nonlabored ventilation and respiratory function stable Cardiovascular status: stable Postop Assessment: no headache, no backache and epidural receding Anesthetic complications: no    Last Vitals:  Vitals:   12/27/16 0400 12/27/16 0502  BP: 132/76 121/60  Pulse: 78 88  Resp: 16 16  Temp: 36.9 C 36.9 C    Last Pain:  Vitals:   12/27/16 0503  TempSrc:   PainSc: 8    Pain Goal: Patients Stated Pain Goal: 0 (12/26/16 2221)               Marrion CoyMERRITT,Fawne Hughley

## 2016-12-27 NOTE — Anesthesia Procedure Notes (Signed)
Epidural Patient location during procedure: OB  Staffing Anesthesiologist: Megahn Killings  Preanesthetic Checklist Completed: patient identified, site marked, surgical consent, pre-op evaluation, timeout performed, IV checked, risks and benefits discussed and monitors and equipment checked  Epidural Patient position: sitting Prep: site prepped and draped and DuraPrep Patient monitoring: continuous pulse ox and blood pressure Approach: midline Location: L3-L4 Injection technique: LOR air  Needle:  Needle type: Tuohy  Needle gauge: 17 G Needle length: 9 cm and 9 Needle insertion depth: 6 cm Catheter type: closed end flexible Catheter size: 19 Gauge Catheter at skin depth: 12 cm Test dose: negative  Assessment Events: blood not aspirated, injection not painful, no injection resistance, negative IV test and no paresthesia  Additional Notes Dosing of Epidural:  1st dose, through catheter .............................................  Xylocaine 40 mg  2nd dose, through catheter, after waiting 3 minutes.........Xylocaine 60 mg    As each dose occurred, patient was free of IV sx; and patient exhibited no evidence of SA injection.  Patient is more comfortable after epidural dosed. Please see RN's note for documentation of vital signs,and FHR which are stable.  Patient reminded not to try to ambulate with numb legs, and that an RN must be present when she attempts to get up.        

## 2016-12-27 NOTE — Anesthesia Preprocedure Evaluation (Signed)
Anesthesia Evaluation  Patient identified by MRN, date of birth, ID band Patient awake    Reviewed: Allergy & Precautions, H&P , Patient's Chart, lab work & pertinent test results  Airway Mallampati: II  TM Distance: >3 FB Neck ROM: full    Dental  (+) Teeth Intact   Pulmonary former smoker,    breath sounds clear to auscultation       Cardiovascular  Rhythm:regular Rate:Normal     Neuro/Psych    GI/Hepatic   Endo/Other    Renal/GU      Musculoskeletal   Abdominal   Peds  Hematology   Anesthesia Other Findings       Reproductive/Obstetrics (+) Pregnancy                             Anesthesia Physical  Anesthesia Plan  ASA: II  Anesthesia Plan: Epidural   Post-op Pain Management:    Induction:   PONV Risk Score and Plan:   Airway Management Planned:   Additional Equipment:   Intra-op Plan:   Post-operative Plan:   Informed Consent: I have reviewed the patients History and Physical, chart, labs and discussed the procedure including the risks, benefits and alternatives for the proposed anesthesia with the patient or authorized representative who has indicated his/her understanding and acceptance.   Dental Advisory Given  Plan Discussed with:   Anesthesia Plan Comments: (Labs checked- platelets confirmed with RN in room. Fetal heart tracing, per RN, reported to be stable enough for sitting procedure. Discussed epidural, and patient consents to the procedure:  included risk of possible headache,backache, failed block, allergic reaction, and nerve injury. This patient was asked if she had any questions or concerns before the procedure started.)        Anesthesia Quick Evaluation  

## 2016-12-27 NOTE — Lactation Note (Signed)
This note was copied from a baby's chart. Lactation Consultation Note  Patient Name: Boy Mindi SlickerRashanda Sandridge UJWJX'BToday's Date: 12/27/2016 Reason for consult: Initial assessment  Baby 12 hours old. Mom reports that baby latches fine. Mom states that she intends to breastfeed and formula/bottle feed as well. Discussed supply and demand and offered to assist mom with latching, but mom declined. Enc mom to call for assistance as needed.   Maternal Data    Feeding    LATCH Score                   Interventions    Lactation Tools Discussed/Used     Consult Status Consult Status: PRN    Sherlyn HayJennifer D Desaree Downen 12/27/2016, 2:01 PM

## 2016-12-28 ENCOUNTER — Encounter: Payer: Medicaid Other | Admitting: Family Medicine

## 2016-12-28 NOTE — Progress Notes (Signed)
Postpartum Day #1  Subjective: Interval History: has complaints pelvic pain managable with NSAIDs. Patient has been able to walk, urinate but has not had a bowel movement yet. She has concerns about her breast milk coming in.   Objective: Vital signs in last 24 hours: Temp:  [98.3 F (36.8 C)] 98.3 F (36.8 C) (08/03 0604) Pulse Rate:  [91-96] 91 (08/03 0604) Resp:  [16-18] 16 (08/03 0604) BP: (121-130)/(69-84) 121/69 (08/03 0604)  Intake/Output from previous day: No intake/output data recorded. Intake/Output this shift: No intake/output data recorded.  General appearance: alert, cooperative and no distress Head: Normocephalic, without obvious abnormality, atraumatic Lungs: clear to auscultation bilaterally Abdomen: soft, non-tender; bowel sounds normal; no masses,  no organomegaly Extremities: extremities normal, atraumatic, no cyanosis or edema  No results found for this or any previous visit (from the past 24 hour(s)).  Studies/Results: No results found.  Scheduled Meds: . ibuprofen  600 mg Oral Q6H  . prenatal multivitamin  1 tablet Oral Q1200  . senna-docusate  2 tablet Oral Q24H  . Tdap  0.5 mL Intramuscular Once   Continuous Infusions: PRN Meds:acetaminophen, benzocaine-Menthol, coconut oil, witch hazel-glycerin **AND** dibucaine, diphenhydrAMINE, ondansetron **OR** ondansetron (ZOFRAN) IV, oxyCODONE, simethicone, zolpidem  Assessment/Plan: Patient is doing well, has concerns about her breast milk, may try breast pump and/or lactation consultant while inpatient. Patient wishes to stay one more day for recovery.    LOS: 2 days   Shannon Randolph   CNM attestation Post Partum Day #1 I have seen and examined this patient and agree with above documentation in the resident's note.   Shannon Randolph is a 26 y.o. [id number] s/p SVD.  Pt denies problems with ambulating, voiding or po intake. Pain is well controlled.  Plan for birth control is Depo-Provera.  Method of  Feeding: bottle  Pt declines early discharge.  PE:  BP 121/69 (BP Location: Right Arm)   Pulse 91   Temp 98.3 F (36.8 C) (Oral)   Resp 16   Ht 5\' 3"  (1.6 m)   Wt 84.8 kg (187 lb)   LMP 02/26/2016   SpO2 100%   Breastfeeding? Unknown   BMI 33.13 kg/m  Fundus firm  Plan for discharge: 12/29/16  Shannon Randolph, CNM 8:39 AM 12/28/2016

## 2016-12-28 NOTE — Clinical Social Work Maternal (Addendum)
  CLINICAL SOCIAL WORK MATERNAL/CHILD NOTE  Patient Details  Name: Shannon Randolph MRN: [id number] Date of Birth: 1991/02/07  Date:  12/28/2016  Clinical Social Worker Initiating Note:  Shannon Randolph Date/ Time Initiated:  12/28/16/1115     Child's Name:  Shannon Randolph   Legal Guardian:  Mother (FOB is Shannon Randolph)   Need for Interpreter:  None   Date of Referral:  12/27/16     Reason for Referral:  Other (Comment) (Flat affect and concerns regarding bonding)   Referral Source:  [facility]   Address:  [address] [place] [address]  Phone number:  [phone]   Household Members:  Self, Minor Children, Parents (MOB's older daughters are Shannon Randolph 04/05/10 and Shannon Randolph 09/20/12)   Natural Supports (not living in the home):  Spouse/significant other, Immediate Family, Friends, Extended Family (FOB's family will also be available to proivde support. )   Professional Supports: None   Employment:     Type of Work:     Education:      Pensions consultant:  [employer]   Other Resources:      Cultural/Religious Considerations Which May Impact Care:  Per [employer] Sheet, MOB is Non-Denominational Strengths:  Ability to meet basic needs , Pediatrician chosen , Home prepared for child    Risk Factors/Current Problems:  None   Cognitive State:  Alert , Able to Concentrate , Linear Thinking , Insightful    Mood/Affect:  Calm , Flat , Relaxed , Interested , Comfortable    CSW Assessment: CSW met with MOB in room 108 to complete an assessment for attachment and bonding, and hx of THC use during pregnancy. When CSW arrived, MOB was engaging in skin to skin and appeared comfortable.  Throughout the assessment MOB was appropriate with infant and responded appropriately to infant's cues. MOB was very soft spoken (CSW had to ask MOB to repeat herself numerous times), appeared flat, but was receptive to meeting with CSW. CSW explained CSW's role and  inquired about MOB's supports.  MOB reported currently residing with MOB's mother, whom will be a source of support as well as FOB. MOB expressed feelings of being excited to parent again and feels prepared to care for infant. MOB denied SI, HI, and DV.  CSW provided MOB with SIDS education and PPD education.  MOB denied having PPD with MOB's older 2 children.  CSW thanked MOB for meeting with CSW and provided MOB with CSW's contact information.  CSW inquired about MOB's SA hx and MOB disclosed the use of marijuana during pregnancy. MOB reported that MOB used marijuana to help decrease MOB's nausea and MOB's last use was about 45 days ago.  CSW informed MOB of hospital's SA policy and procedure and MOB was understanding. CSW made MOB aware the infant's UDS was not collected, however, CSW will monitor infant's CDS and will make a report to CPS if warranted; MOB was understanding. CSW offered MOB SA resources and MOB declined.    Although MOB was flat and soft spoken during the assessment, MOB was appropriate with infant.There are no barriers to d/c.   CSW Plan/Description:  Information/Referral to [employer] , [employer] , No Further Intervention Required/No Barriers to Discharge   Shannon Randolph, MSW, LCSW Clinical Social Work [phone]   Shannon Nanas, LCSW 12/28/2016, 11:19 AM

## 2016-12-28 NOTE — Lactation Note (Signed)
This note was copied from a baby's chart. Lactation Consultation Note  Patient Name: Shannon Randolph Reason for consult: Follow-up assessment Baby at 32 hr of life. Upon entry mom was offering a bottle of Alimentum. Offered to help latch baby and mom declined. She stated "there's nothing coming out". Offered to demonstrated manual express and she stated "I done that and I aint got nothing". Explained the more she stimulates the breast more quickly her milk transition and the more milk she will have long term. Suggested she offer both breast for at least 10 minutes each then f/u with formula per volume guidelines as needed. She then stated "I am just gonna give him bottles". Discussed up coming breast changes and ways to dry up her milk if she did not choose to pump to feed. She does have a DEBP at home that "I might use". She is aware of lactation services and support group. She will call for help if she changes her mind about bf. Reviewed paced bottle feeding and gave her the formula feeding handouts. Switched baby to [employer].   Maternal Data Has patient been taught Hand Expression?: Yes (per mom)  Feeding Feeding Type: Formula  LATCH Score                   Interventions    Lactation Tools Discussed/Used     Consult Status Consult Status: Complete    Rulon Eisenmengerlizabeth E Zeshan Sena Randolph, 10:36 AM

## 2016-12-29 ENCOUNTER — Ambulatory Visit: Payer: Self-pay

## 2016-12-29 MED ORDER — BREAST MILK
ORAL | Status: DC
  Filled 2016-12-29: qty 1

## 2016-12-29 NOTE — Lactation Note (Signed)
This note was copied from a baby's chart. Lactation Consultation Note  Patient Name: Shannon Randolph WGNFA'OToday's Date: 12/29/2016 Reason for consult: Follow-up assessment [facility](WIC DEBP loaner provided and obtained $30.oo from dad )  Baby is 3258 hours old, and moms feeding preference Breast / and formula, and has been pumping milk is in. Per mom woke up this am and breast were firm to hard with knots and she pumped with relief and the knots went away.  LC updated the doc flow sheets per mom and the last feeding was from a bottle, 60 ml of formula.  LC reviewed supply and demand, and taking into considerations moms desire to breastfeed and provided EBM in  A bottle. LC recommended since she has already started giving bottles is to consider breast feeding majority feedings Option  #1 - breast feed soften 1st breast well , and offer 2nd breast if still hungry, if the baby didn't feed 2nd breast , release down.  Option #2 - breast feed 1st breast , supplement with EBM in a bottle 30 ml and pump down the other breast.  Option #3 - pump both breast for 15 - 20 mins , and feed the baby EBM gradually increasing the volume.  Mom denies sore nipples. Sore nipple and engorgement prevention and tx reviewed.  And mom has been pumping with the DEBP.  Mom requested a DEBP / WIC loaner due to not having a DEBP at home.  LC gave mom instructions to where to bring the DEBP / WIC.  LC had mom fill out the pump paper work, $ 30.oo obtained and LC instructed mom on the use of the DEBP.  Mother informed of post-discharge support and given phone number to the lactation department, including services for phone call assistance; out-patient appointments; and breastfeeding support group. List of other breastfeeding resources in the community given in the handout. Encouraged mother to call for problems or concerns related to breastfeeding.    Maternal Data    Feeding Feeding Type: Bottle Fed - Formula Nipple Type: Slow -  flow  LATCH Score                   Interventions Interventions: Breast feeding basics reviewed  Lactation Tools Discussed/Used WIC Program: Yes Pump Review: Setup, frequency, and cleaning;Milk Storage Initiated by:: MAI  Date initiated:: 12/29/16   Consult Status Consult Status: Follow-up Date: 12/29/16 Follow-up type: In-patient    Matilde SprangMargaret Ann Patrick Salemi 12/29/2016, 1:40 PM

## 2016-12-29 NOTE — Plan of Care (Signed)
Problem: Activity: Goal: Ability to tolerate increased activity will improve Outcome: Completed/Met Date Met: 12/29/16 Pt ambulating in room without difficulty.  Encouraged to walk in the hallways.  Problem: Bowel/Gastric: Goal: Gastrointestinal status will improve Outcome: Completed/Met Date Met: 12/29/16 Pt reports last bowel movement was the day of delivery.  She reports that she is passing gas.  Encouraged pt to increase walking, increase intake of fluid and high fiber foods. Pt verbalized understanding.   Problem: Urinary Elimination: Goal: Ability to reestablish a normal urinary elimination pattern will improve Outcome: Completed/Met Date Met: 12/29/16 Pt voiding without difficulty.

## 2016-12-29 NOTE — Discharge Summary (Signed)
OB Discharge Summary     Patient Name: Shannon SlickerRashanda Randolph DOB: 14-Mar-1991 MRN: [id number]  Date of admission: 12/26/2016 Delivering MD: SHIRLEY, [place]   Date of discharge: 12/29/2016  Admitting diagnosis: 40wks CTX Intrauterine pregnancy: 5837w2d     Secondary diagnosis:  Active Problems:   Normal labor  Additional problems:  Patient Active Problem List   Diagnosis Date Noted  . Normal labor 12/26/2016  . Abnormal Pap smear of cervix 07/04/2016  . Nausea and vomiting in pregnancy 06/04/2016  . Supervision of normal first pregnancy in third trimester 04/27/2016  . LBP (low back pain) 10/18/2011  . Vaginal discharge 12/11/2010  . Contraception 11/01/2010  . HIP PAIN, LEFT 02/22/2010  . 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 TOBACCO ABUSE 04/07/2009      Discharge diagnosis: Term Pregnancy Delivered                                                                                                Post partum procedures:none  Augmentation: none  Complications: None  Hospital course:  Onset of Labor With Vaginal Delivery     26 y.o. yo [id number] at 9537w2d was admitted in Active Labor on 12/26/2016. Patient had an uncomplicated labor course as follows:  Membrane Rupture Time/Date: 1:27 AM ,12/27/2016   Intrapartum Procedures: Episiotomy: None [1]                                         Lacerations:  None [1]  Patient had a delivery of a Viable infant. 12/27/2016  Information for the patient's newborn:  Shannon Randolph, Boy Piera [[id number]][[id number]]  Delivery Method: Vag-Spont    Pateint had an uncomplicated postpartum course.  She is ambulating, tolerating a regular diet, passing flatus, and urinating well. Patient is discharged home in stable condition on 12/29/16.   Physical exam  Vitals:   12/27/16 1621 12/28/16 0604 12/28/16 1810 12/29/16 0519  BP: 130/84 121/69 (!) 125/94 115/65  Pulse: 96 91 (!) 101 86  Resp: 18 16 17 20   Temp: 98.3 F (36.8 C) 98.3 F (36.8 C) 98.8 F (37.1 C)  98.2 F (36.8 C)  TempSrc: Rectal Oral Oral Oral  SpO2:    100%  Weight:      Height:       General: alert, cooperative and no distress Lochia: appropriate Uterine Fundus: firm DVT Evaluation: No evidence of DVT seen on physical exam. Labs: Lab Results  Component Value Date   WBC 14.5 (H) 12/26/2016   HGB 11.8 (L) 12/26/2016   HCT 35.7 (L) 12/26/2016   MCV 84.6 12/26/2016   PLT 177 12/26/2016   CMP Latest Ref Rng & Units 12/28/2014  Glucose 65 - 99 mg/dL 90  BUN 6 - 20 mg/dL 9  Creatinine 1.300.44 - 8.651.00 mg/dL 7.840.67  Sodium 696135 - 295145 mmol/L 136  Potassium 3.5 - 5.1 mmol/L 3.5  Chloride 101 - 111 mmol/L 104  CO2 22 - 32 mmol/L 22  Calcium 8.9 - 10.3 mg/dL 8.9  Total Protein 6.5 -  8.1 g/dL 6.5  Total Bilirubin 0.3 - 1.2 mg/dL 0.3  Alkaline Phos 38 - 126 U/L 74  AST 15 - 41 U/L 14(L)  ALT 14 - 54 U/L 13(L)    Discharge instruction: per After Visit Summary and "Baby and Me Booklet".  After visit meds:  Allergies as of 12/29/2016   No Known Allergies     Medication List    STOP taking these medications   cyclobenzaprine 10 MG tablet Commonly known as:  FLEXERIL     TAKE these medications   PRE-NATAL FORMULA Tabs Take 1 tablet by mouth daily.       Diet: routine diet  Activity: Advance as tolerated. Pelvic rest for 6 weeks.   Outpatient follow up:6 weeks Follow-up Information    [facility]. Schedule an appointment as soon as possible for a visit.   Specialty:  Family Medicine Why:  Post-partum visit in 6 weeks Contact information: [address] [id number] [place] [id number] [phone]          Postpartum contraception: Depo Provera  Newborn Data: Live born female  Birth Weight: 6 lb 10.9 oz (3031 g) APGAR: 8, 9  Baby Feeding: Bottle and Breast Disposition:home with mother  Shannon AdaJazma Phelps, DO OB Fellow Faculty Practice, [facility] 12/29/2016, 9:03 AM

## 2016-12-29 NOTE — Discharge Instructions (Signed)

## 2016-12-30 LAB — BPAM RBC
BLOOD PRODUCT EXPIRATION DATE: 201809042359
Blood Product Expiration Date: 201809042359
Unit Type and Rh: 5100
Unit Type and Rh: 5100

## 2016-12-30 LAB — TYPE AND SCREEN
ABO/RH(D): B POS
Antibody Screen: POSITIVE
DAT, IgG: NEGATIVE
PT AG Type: NEGATIVE
UNIT DIVISION: 0
Unit division: 0

## 2017-02-19 ENCOUNTER — Encounter: Payer: Self-pay | Admitting: Student

## 2017-02-19 ENCOUNTER — Ambulatory Visit (INDEPENDENT_AMBULATORY_CARE_PROVIDER_SITE_OTHER): Payer: Medicaid Other | Admitting: Student

## 2017-02-19 DIAGNOSIS — Z23 Encounter for immunization: Secondary | ICD-10-CM

## 2017-02-19 DIAGNOSIS — R87612 Low grade squamous intraepithelial lesion on cytologic smear of cervix (LGSIL): Secondary | ICD-10-CM

## 2017-02-19 DIAGNOSIS — Z3042 Encounter for surveillance of injectable contraceptive: Secondary | ICD-10-CM | POA: Diagnosis not present

## 2017-02-19 MED ORDER — MEDROXYPROGESTERONE ACETATE 150 MG/ML IM SUSY
150.0000 mg | PREFILLED_SYRINGE | Freq: Once | INTRAMUSCULAR | Status: AC
  Administered 2017-02-19: 150 mg via INTRAMUSCULAR

## 2017-02-19 MED ORDER — MEDROXYPROGESTERONE ACETATE 150 MG/ML IM SUSP: 150.0000 mg | Freq: Once | INTRAMUSCULAR | Status: DC

## 2017-02-19 NOTE — Patient Instructions (Addendum)
Congratulations and having a baby! 1. Birth control/contraception: We gave you a Depo injection. This is good for 3 months. You can return in 3 months to have her next Depo shot.  2.   Abnormal Pap smear: Please come back and see [place] in about 4 months to for repeat pap smear  If we did any lab work today, and the results require attention, either me or my nurse will get in touch with you. If everything is normal, you will get a letter in mail and a message via . If you don't hear from [place] in two weeks, please give [place] a call. Otherwise, we look forward to seeing you again at your next visit. If you have any questions or concerns before then, please call the clinic at [phone].  Please bring all your medications to every doctors visit  Sign up for My Chart to have easy access to your labs results, and communication with your Primary care physician.    Please check-out at the front desk before leaving the clinic.    Take Care,   Dr. Alanda Slim Medroxyprogesterone injection [Contraceptive] What is this medicine? MEDROXYPROGESTERONE (me DROX ee proe JES te rone) contraceptive injections prevent pregnancy. They provide effective birth control for 3 months. Depo-subQ Provera 104 is also used for treating pain related to endometriosis. This medicine may be used for other purposes; ask your health care provider or pharmacist if you have questions. COMMON BRAND NAME(S): Depo-Provera, Depo-subQ Provera 104 What should I tell my health care provider before I take this medicine? They need to know if you have any of these conditions: -frequently drink alcohol -asthma -blood vessel disease or a history of a blood clot in the lungs or legs -bone disease such as osteoporosis -breast cancer -diabetes -eating disorder (anorexia nervosa or bulimia) -high blood pressure -HIV infection or AIDS -kidney disease -liver disease -mental depression -migraine -seizures (convulsions) -stroke -tobacco  smoker -vaginal bleeding -an unusual or allergic reaction to medroxyprogesterone, other hormones, medicines, foods, dyes, or preservatives -pregnant or trying to get pregnant -breast-feeding How should I use this medicine? Depo-Provera Contraceptive injection is given into a muscle. Depo-subQ Provera 104 injection is given under the skin. These injections are given by a health care professional. You must not be pregnant before getting an injection. The injection is usually given during the first 5 days after the start of a menstrual period or 6 weeks after delivery of a baby. Talk to your pediatrician regarding the use of this medicine in children. Special care may be needed. These injections have been used in female children who have started having menstrual periods. Overdosage: If you think you have taken too much of this medicine contact a poison control center or emergency room at once. NOTE: This medicine is only for you. Do not share this medicine with others. What if I miss a dose? Try not to miss a dose. You must get an injection once every 3 months to maintain birth control. If you cannot keep an appointment, call and reschedule it. If you wait longer than 13 weeks between Depo-Provera contraceptive injections or longer than 14 weeks between Depo-subQ Provera 104 injections, you could get pregnant. Use another method for birth control if you miss your appointment. You may also need a pregnancy test before receiving another injection. What may interact with this medicine? Do not take this medicine with any of the following medications: -bosentan This medicine may also interact with the following medications: -aminoglutethimide -antibiotics or medicines  for infections, especially rifampin, rifabutin, rifapentine, and griseofulvin -aprepitant -barbiturate medicines such as phenobarbital or primidone -bexarotene -carbamazepine -medicines for seizures like ethotoin, felbamate,  oxcarbazepine, phenytoin, topiramate -modafinil -St. John's wort This list may not describe all possible interactions. Give your health care provider a list of all the medicines, herbs, non-prescription drugs, or dietary supplements you use. Also tell them if you smoke, drink alcohol, or use illegal drugs. Some items may interact with your medicine. What should I watch for while using this medicine? This drug does not protect you against HIV infection (AIDS) or other sexually transmitted diseases. Use of this product may cause you to lose calcium from your bones. Loss of calcium may cause weak bones (osteoporosis). Only use this product for more than 2 years if other forms of birth control are not right for you. The longer you use this product for birth control the more likely you will be at risk for weak bones. Ask your health care professional how you can keep strong bones. You may have a change in bleeding pattern or irregular periods. Many females stop having periods while taking this drug. If you have received your injections on time, your chance of being pregnant is very low. If you think you may be pregnant, see your health care professional as soon as possible. Tell your health care professional if you want to get pregnant within the next year. The effect of this medicine may last a long time after you get your last injection. What side effects may I notice from receiving this medicine? Side effects that you should report to your doctor or health care professional as soon as possible: -allergic reactions like skin rash, itching or hives, swelling of the face, lips, or tongue -breast tenderness or discharge -breathing problems -changes in vision -depression -feeling faint or lightheaded, falls -fever -pain in the abdomen, chest, groin, or leg -problems with balance, talking, walking -unusually weak or tired -yellowing of the eyes or skin Side effects that usually do not require medical  attention (report to your doctor or health care professional if they continue or are bothersome): -acne -fluid retention and swelling -headache -irregular periods, spotting, or absent periods -temporary pain, itching, or skin reaction at site where injected -weight gain This list may not describe all possible side effects. Call your doctor for medical advice about side effects. You may report side effects to FDA at 1-800-FDA-1088. Where should I keep my medicine? This does not apply. The injection will be given to you by a health care professional. NOTE: This sheet is a summary. It may not cover all possible information. If you have questions about this medicine, talk to your doctor, pharmacist, or health care provider.  2018 Elsevier/Gold Standard (2008-06-04 18:37:56)

## 2017-02-19 NOTE — Progress Notes (Signed)
Subjective:    Shannon Randolph is a 26 y.o. old female here for routine postpartum visit  HPI Postpartum Visit Patient is here for a postpartum visit. She is 6 weeks postpartum following a spontaneous vaginal delivery. I have fully reviewed the prenatal and intrapartum course. The delivery was at [redacted]w[redacted]d gestational weeks. Outcome: spontaneous vaginal delivery. Anesthesia: epidural.  Postpartum course has been normal without complications. Baby's course has been uncomplicated. Baby is feeding by bottle - Similac Advance. She started her period 4 days ago. LMP 02/16/2017. Bowel function is normal. Bladder function is normal. Patient is not sexually active. Contraception method is Depo-Provera injections. Postpartum depression screening: negative. She scored 8 with 0 on the the last item on Edinburgh. Patient states that she is tired and hungry this morning. She says her mother was not at home last night and didn't have someone to help with baby at night. Father doesn't live with them but does visit.   PMH/Problem List: has TOBACCO ABUSE; HIP PAIN, LEFT; Contraception; Vaginal discharge; LBP (low back pain); and Abnormal Pap smear of cervix on her problem list.   has a past medical history of Bacterial vaginosis; Infection; and No pertinent past medical history.  FH:  Family History  Problem Relation Age of Onset  . Hearing loss Neg Hx     SH Social History  Substance Use Topics  . Smoking status: Former Smoker    Quit date: 01/11/2012  . Smokeless tobacco: Former Neurosurgeon    Quit date: 01/11/2012  . Alcohol use No    Review of Systems Review of systems negative except for pertinent positives and negatives in history of present illness above.     Objective:     Vitals:   02/19/17 1111  BP: 108/64  Pulse: 85  Temp: 98.2 F (36.8 C)  TempSrc: Oral  SpO2: 99%  Weight: 170 lb 9.6 oz (77.4 kg)  Height:  (1.6 m)   Body mass index is 30.22 kg/m.  Physical Exam GEN: appears well, no  apparent distress. Eyes: conjunctiva without injection, sclera anicteric HEM: negative for cervical or periauricular lymphadenopathies ENDO: negative thyromegally CVS: RRR, nl S1&S2, no murmurs, no edema RESP: no IWOB, good air movement bilaterally, CTAB GI: BS present & normal, soft, NTND GU: no suprapubic or CVA tenderness MSK: no focal tenderness or notable swelling SKIN: no apparent skin lesion NEURO: alert and oiented appropriately, no gross deficits  PSYCH: Mildly restricted affect. She says she is just tired and sleepy because she didn't have good sleep last night.      Edinburgh Postnatal Depression Scale - 02/19/17 1140      Edinburgh Postnatal Depression Scale:  In the Past 7 Days   I have been able to laugh and see the funny side of things. 0   I have looked forward with enjoyment to things. 0   I have blamed myself unnecessarily when things went wrong. 3   I have been anxious or worried for no good reason. 1   I have felt scared or panicky for no good reason. 1   Things have been getting on top of me. 2   I have been so unhappy that I have had difficulty sleeping. 0   I have felt sad or miserable. 1   I have been so unhappy that I have been crying. 0   The thought of harming myself has occurred to me. 0   Edinburgh Postnatal Depression Scale Total 8  Assessment and Plan:  1. Routine postpartum follow-up: No complication. She is bottlefeeding. She is not sexually active yet. She is currently on her period. Edinburgh depression score 8, negative for depression and 0 on the last item. Patient has a good support network. -Discussed about return precautions  2. Encounter for Depo-Provera contraception Discussed about birth control options for most effective (LARC) to least effective. Patient has tried IUD and Nexplanon in the past and didn't like the experience. She reports pain with sexual intercourse when she had an IUD. She also didn't like the side effects of  Nexplanon. She is not specific about the side effect. She likes to use Depo-provera. I discussed the benefits and risks of this. She has no history of hypertension and depression. She received her Depo injection today.  3. Low grade squamous intraepithelial lesion (LGSIL) on cervical Pap smear Patient had a Pap smear about 9 months ago that showed LGSIL. Advised her to return in 3-4 months for another Pap smear.  4. Encounter for immunization Received influenza vaccine today.  Return in 4 months (on 06/21/2017), or if symptoms worsen or fail to improve, for Pap and physical.  Almon Hercules, MD 02/19/17 Pager: [phone]

## 2017-02-19 NOTE — Progress Notes (Signed)
Pt received in arm due to having some issues with it being done in her GM, told her we would do it this time but to work towards getting it in the GM due to the larger muscle mass. Pt understood.

## 2017-02-19 NOTE — Addendum Note (Signed)
Addended by: Lamonte Sakai, Machele Deihl D on: 02/19/2017 12:14 PM   Modules accepted: Orders

## 2017-04-23 ENCOUNTER — Other Ambulatory Visit: Payer: Self-pay

## 2017-04-23 ENCOUNTER — Ambulatory Visit (INDEPENDENT_AMBULATORY_CARE_PROVIDER_SITE_OTHER): Payer: Medicaid Other | Admitting: Student

## 2017-04-23 ENCOUNTER — Encounter: Payer: Self-pay | Admitting: Student

## 2017-04-23 VITALS — BP 102/68 | HR 100 | Temp 99.2°F | Ht 63.0 in | Wt 172.0 lb

## 2017-04-23 DIAGNOSIS — M654 Radial styloid tenosynovitis [de Quervain]: Secondary | ICD-10-CM

## 2017-04-23 MED ORDER — NAPROXEN 500 MG PO TABS: 500.0000 mg | ORAL_TABLET | Freq: Two times a day (BID) | ORAL | 0 refills | Status: DC

## 2017-04-23 NOTE — Patient Instructions (Addendum)
It was great seeing you today! We have addressed the following issues today  Wrist and thumb pain: This is likely due to inflammation of the tendons in your thumb on your wrist.  Is called de Quervain's tendinopathy.  See below for more than this.  We gave you a prescription for naproxen.  Take this twice a day every day.  Take this with meals.  Please call the number we gave you to schedule an appointment with the sports medicine clinic.  We also sent a referral to this clinic.  If we did any lab work today, and the results require attention, either me or my nurse will get in touch with you. If everything is normal, you will get a letter in mail and a message via . If you don't hear from us in two weeks, please give us a call. Otherwise, we look forward to seeing you again at your next visit. If you have any questions or concerns before then, please call the clinic at [phone].  Please bring all your medications to every doctors visit  Sign up for My Chart to have easy access to your labs results, and communication with your Primary care physician.    Please check-out at the front desk before leaving the clinic.    Take Care,   Dr. Alanda SlimGonfa

## 2017-04-23 NOTE — Progress Notes (Signed)
  Subjective:    Shannon Randolph is a 26 y.o. old female here for wrist pain  HPI Wrist and thumb pain: bilateral. Pain is over the later aspect of her thumb and wrist. This has been going on since she had a baby, four months ago.  She says she was told that this is a carpal tunnel syndrome and it should get better over time.  However, if pain has gotten worse to the extent of difficulty using toilet papers appropriately. She says she can't user her thumb even for little things. She reports mild swelling intermittently.  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 She says it sometimes feel hot and she had to cool it down with cold water or ice. Denies fever. No history of injury or trauma.Jamal Maes. Tried tylenol without improvement.    PMH/Problem List: has TOBACCO ABUSE; HIP PAIN, LEFT; Contraception; Vaginal discharge; LBP (low back pain); and Abnormal Pap smear of cervix on their problem list.   has a past medical history of Bacterial vaginosis, Infection, and No pertinent past medical history.  FH:  Family History  Problem Relation Age of Onset  . Hearing loss Neg Hx     SH Social History   Tobacco Use  . Smoking status: Former Smoker    Last attempt to quit: 01/11/2012    Years since quitting: 5.2  . Smokeless tobacco: Former NeurosurgeonUser    Quit date: 01/11/2012  Substance Use Topics  . Alcohol use: No  . Drug use: No    Review of Systems Review of systems negative except for pertinent positives and negatives in history of present illness above.     Objective:     Vitals:   04/23/17 1610  BP: 102/68  Pulse: 100  Temp: 99.2 F (37.3 C)  TempSrc: Oral  SpO2: 98%  Weight: 172 lb (78 kg)  Height: 5\' 3"  (1.6 m)   Body mass index is 30.47 kg/m.  Physical Exam GEN: appears well, no ditress RESP:  No IWOB MSK:  Wrist and Hand:   Appears symmetric, no apparent swelling, skin changes or deformity.  Tenderness to palpation in her thumbs and lateral aspect of her  wrist bilaterally.   Full ROM wrist/hand/digits but pain with extension of her wrist and flexion of her thumbs bilaterally.  Also pain over the radial side with ulnar deviation of the wrist  No instability  Finkelstein's positive. Phalen's and Tinel's negative  Neuro: grip 5/5. Light sensation intact in all dermatomes.   CVS: radial and ulnar pulses normal. Cap refills brisk. No cyanosis     Assessment and Plan:  1. Tendinitis, de Quervain's: Bilateral.  Given prescription for naproxen 500 mg twice daily.  Recommended taking this continuously at least for 1 week.  Recommended taking this with food.  We will send her to sports medicine for thumb and wrist splint which is also available over the counter.  Recommended icing as well. - Ambulatory referral to Sports Medicine  Return if symptoms worsen or fail to improve.  Almon Herculesaye T Crescentia Boutwell, MD 04/23/17 Pager: [phone]

## 2017-05-15 ENCOUNTER — Ambulatory Visit: Payer: Medicaid Other | Admitting: Family Medicine

## 2017-06-05 ENCOUNTER — Ambulatory Visit: Payer: Self-pay | Admitting: Family Medicine

## 2017-08-09 ENCOUNTER — Ambulatory Visit (INDEPENDENT_AMBULATORY_CARE_PROVIDER_SITE_OTHER): Payer: Self-pay | Admitting: Student in an Organized Health Care Education/Training Program

## 2017-08-09 ENCOUNTER — Encounter: Payer: Self-pay | Admitting: Student in an Organized Health Care Education/Training Program

## 2017-08-09 DIAGNOSIS — T7840XA Allergy, unspecified, initial encounter: Secondary | ICD-10-CM | POA: Insufficient documentation

## 2017-08-09 MED ORDER — HYDROXYZINE PAMOATE 25 MG PO CAPS: 25.0000 mg | ORAL_CAPSULE | Freq: Three times a day (TID) | ORAL | 0 refills | Status: DC | PRN

## 2017-08-09 MED ORDER — TRIAMCINOLONE ACETONIDE 0.1 % EX OINT: 1.0000 "application " | TOPICAL_OINTMENT | Freq: Two times a day (BID) | CUTANEOUS | 0 refills | Status: DC

## 2017-08-09 NOTE — Patient Instructions (Signed)
It was a pleasure seeing you today in our clinic.  Here is the treatment plan we have discussed and agreed upon together:  STOP benadryl, START atarax (sent to your pharmacy) Additionally, use kenalog ointment over your scalp for itching (sent to your pharmacy)  Ibuprofen may help with inflammation and pain. Try using cool towels and cool water over your scalp.  STOP using peroxide and hot water on your scalp.  STOP using sleeping pills prescribed to your family members.  Please follow up if you develop fevers, if your rash worsens or fails to improve.  Our clinic's number is [phone]. Please call with questions or concerns about what we discussed today.  Be well, Dr. Mosetta PuttFeng

## 2017-08-09 NOTE — Assessment & Plan Note (Addendum)
Most likely allergic reaction to hair dye, but patient has been using peroxide and hot water which likely is aggravating the problem. Scalp does not appear to be cellulitic on this exam. - Switch to atarax from benadryl - kenalog ointment - NSAIDS to reduce pain and inflammation - recommend cool showers, cool towels - STOP using peroxide and hot water - return precautions including fevers provided - advised her to discontinue use of her mother's sleeping pills

## 2017-08-09 NOTE — Progress Notes (Signed)
   CC: scalp allergic reaction  HPI: Shannon Randolph is a 27 y.o. female   Allergic scalp reaction [facility] to hair dye on 3/8 and 3/9. Location: Scalp Medications tried: She has been using sleeping pills from mom, unsure what they are called. Additionally notes that benadryl and OTC hydrocortisone are not helping. She has tried using peroxide and hot water over the scalp which has not helped. Prednisone, norco, and diphenhydramine prescribed from ED, she has used these. Also was given decadron in the ED. Patient believes may be caused by hair dye.  Symptoms Itching: Yes Pain over rash: painful Feeling ill all over: feels light headed, like head is big, has been feeling dizzy during the day Fever: no Mouth sores: no Face or tongue swelling: no Trouble breathing: no Joint swelling or pain: no  Review of Symptoms - see HPI PMH - Smoking status noted.    Review of Symptoms:  See HPI for ROS.   CC, SH/smoking status, and VS noted.  Objective: BP 110/78   Pulse 100   Temp 99.6 F (37.6 C) (Oral)   Ht 5\' 3"  (1.6 m)   Wt 176 lb 9.6 oz (80.1 kg)   LMP 08/06/2017 (Approximate)   SpO2 98%   BMI 31.28 kg/m  GEN: NAD, alert, cooperative, and pleasant. SKIN: +areas of erythema and serous drainage noted over scalp between the patient's braids. No swelling, no purulent drainage.  Assessment and plan:  Allergic reaction Most likely allergic reaction to hair dye, but patient has been using peroxide and hot water which likely is aggravating the problem. Scalp does not appear to be cellulitic on this exam. - Switch to atarax from benadryl - kenalog ointment - NSAIDS to reduce pain and inflammation - recommend cool showers, cool towels - STOP using peroxide and hot water - return precautions including fevers provided - advised her to discontinue use of her mother's sleeping pills   No orders of the defined types were placed in this  encounter.   Meds ordered this encounter  Medications  . hydrOXYzine (VISTARIL) 25 MG capsule    Sig: Take 1 capsule (25 mg total) by mouth 3 (three) times daily as needed for itching.    Dispense:  30 capsule    Refill:  0  . triamcinolone ointment (KENALOG) 0.1 %    Sig: Apply 1 application topically 2 (two) times daily. To affected area of scalp    Dispense:  15 g    Refill:  0    Howard PouchLauren Jenyfer Trawick, MD,MS,  PGY2 08/09/2017 11:34 AM

## 2018-05-01 ENCOUNTER — Other Ambulatory Visit (HOSPITAL_COMMUNITY)
Admission: RE | Admit: 2018-05-01 | Discharge: 2018-05-01 | Disposition: A | Discharge status: Home / Self Care | Payer: Medicaid Other | Source: Ambulatory Visit | Attending: Family Medicine | Admitting: Family Medicine

## 2018-05-01 ENCOUNTER — Encounter: Payer: Self-pay | Admitting: Student in an Organized Health Care Education/Training Program

## 2018-05-01 ENCOUNTER — Other Ambulatory Visit: Payer: Self-pay

## 2018-05-01 ENCOUNTER — Ambulatory Visit (INDEPENDENT_AMBULATORY_CARE_PROVIDER_SITE_OTHER): Payer: Self-pay | Admitting: Student in an Organized Health Care Education/Training Program

## 2018-05-01 VITALS — BP 102/60 | HR 97 | Temp 98.2°F | Ht 63.0 in | Wt 163.8 lb

## 2018-05-01 DIAGNOSIS — Z202 Contact with and (suspected) exposure to infections with a predominantly sexual mode of transmission: Secondary | ICD-10-CM | POA: Insufficient documentation

## 2018-05-01 DIAGNOSIS — Z23 Encounter for immunization: Secondary | ICD-10-CM

## 2018-05-01 LAB — POCT WET PREP (WET MOUNT)
Clue Cells Wet Prep Whiff POC: POSITIVE
Trichomonas Wet Prep HPF POC: ABSENT

## 2018-05-01 MED ORDER — METRONIDAZOLE 500 MG PO TABS
500.0000 mg | ORAL_TABLET | Freq: Two times a day (BID) | ORAL | 0 refills | Status: DC
Start: 2018-05-01 — End: 2018-09-29

## 2018-05-01 NOTE — Progress Notes (Signed)
   CC: vaginal discharge  HPI: Shannon Randolph is a 27 y.o. female presenting for vaginal discharge.  VAGINAL DISCHARGE  Having vaginal discharge for 2 weeks. Discharge consistency:  Discharge color: white, clear Medications tried: none. Uses dove and body washes in t hat area  Recent antibiotic use: none Sex in last month: yes Possible STD exposure: denies but would like testing  Symptoms Fever: none Dysuria:none Vaginal bleeding: none Abdomen or Pelvic pain: none Back pain: none Genital sores or ulcers:none Rash: none Pain during sex: none Missed menstrual period: none,   ROS see HPI Smoking Status noted    Review of Symptoms:  See HPI for ROS.   CC, SH/smoking status, and VS noted.  Objective: BP 102/60   Pulse 97   Temp 98.2 F (36.8 C) (Oral)   Ht 5\' 3"  (1.6 m)   Wt 163 lb 12.8 oz (74.3 kg)   LMP 04/24/2018 (Approximate)   SpO2 96%   BMI 29.02 kg/m  GEN: NAD, alert, cooperative, and pleasant. RESPIRATORY: Comfortable work of breathing, speaks in full sentences CV: Regular rate noted, distal extremities well perfused and warm without edema GI: Soft, nondistended SKIN: warm and dry, no rashes or lesions NEURO: II-XII grossly intact MSK: Moves 4 extremities equally PSYCH: AAOx3, appropriate affect GU: thin white cervical discharge. Internal and external vagina appear normal. Normal appearing cervix. No cervical motion tenderness or adenexal masses.   Assessment and plan:  1. Vaginal Discharge  Found to have clue cells consistent with BV on wet prep. Patient also desires STI testing so this was completed concurrently and we will follow up to give her the results. - POCT Wet Prep [facility]([place]) - Cervicovaginal ancillary only - RPR - HIV antibody (with reflex) - metroNIDAZOLE (FLAGYL) 500 MG tablet; Take 1 tablet (500 mg total) by mouth 2 (two) times daily.  Dispense: 14 tablet; Refill: 0  2. Need for immunization against influenza - Flu Vaccine  QUAD 36+ mos IM  Howard PouchLauren Anchor Dwan, MD,MS,  PGY3 05/04/2018 5:44 AM

## 2018-05-01 NOTE — Patient Instructions (Signed)
It was a pleasure seeing you today in our clinic. Here is the treatment plan we have discussed and agreed upon together:  You have BV. I sent a antibiotic course to your pharmacy.  We drew blood work at today's visit. I will call or send you a letter with these results. If you do not hear from me within the next week, please give our office a call.  Our clinic's number is [phone]. Please call with questions or concerns about what we discussed today.  Be well, Dr. Mosetta PuttFeng

## 2018-05-02 LAB — CERVICOVAGINAL ANCILLARY ONLY
Chlamydia: NEGATIVE
Neisseria Gonorrhea: NEGATIVE

## 2018-05-02 LAB — RPR: RPR Ser Ql: NONREACTIVE

## 2018-05-02 LAB — HIV ANTIBODY (ROUTINE TESTING W REFLEX): HIV Screen 4th Generation wRfx: NONREACTIVE

## 2018-05-05 ENCOUNTER — Telehealth: Payer: Self-pay | Admitting: *Deleted

## 2018-05-05 NOTE — Telephone Encounter (Signed)
Pt informed of below. Zimmerman Rumple, Keylani Perlstein D, CMA  

## 2018-05-05 NOTE — Telephone Encounter (Signed)
-----   Message from Howard PouchLauren Feng, MD sent at 05/02/2018  9:58 PM EST ----- Please inform patient her STI screens were negative.

## 2018-05-09 ENCOUNTER — Telehealth: Payer: Self-pay | Admitting: Family Medicine

## 2018-05-09 NOTE — Telephone Encounter (Signed)
HIPAA compliant call back message left.    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 Whenever she calls back, please help her schedule PAP appointment with her PCP as early as possible.  I will update her PAP HM due date to alert any provider who sees her prior to PCP visit to obtain PAP test.  Note forwarded to her PCP and the provider she saw recently.

## 2018-09-29 ENCOUNTER — Encounter: Payer: Self-pay | Admitting: Family Medicine

## 2018-09-29 ENCOUNTER — Other Ambulatory Visit (HOSPITAL_COMMUNITY)
Admission: RE | Admit: 2018-09-29 | Discharge: 2018-09-29 | Disposition: A | Discharge status: Home / Self Care | Payer: Medicaid Other | Source: Ambulatory Visit | Attending: Family Medicine | Admitting: Family Medicine

## 2018-09-29 ENCOUNTER — Ambulatory Visit (INDEPENDENT_AMBULATORY_CARE_PROVIDER_SITE_OTHER): Payer: Medicaid Other | Admitting: Family Medicine

## 2018-09-29 ENCOUNTER — Other Ambulatory Visit: Payer: Self-pay

## 2018-09-29 VITALS — BP 102/60 | HR 93

## 2018-09-29 DIAGNOSIS — R87612 Low grade squamous intraepithelial lesion on cytologic smear of cervix (LGSIL): Secondary | ICD-10-CM

## 2018-09-29 DIAGNOSIS — N898 Other specified noninflammatory disorders of vagina: Secondary | ICD-10-CM | POA: Diagnosis not present

## 2018-09-29 DIAGNOSIS — Z124 Encounter for screening for malignant neoplasm of cervix: Secondary | ICD-10-CM | POA: Diagnosis not present

## 2018-09-29 DIAGNOSIS — Z202 Contact with and (suspected) exposure to infections with a predominantly sexual mode of transmission: Secondary | ICD-10-CM

## 2018-09-29 DIAGNOSIS — Z3009 Encounter for other general counseling and advice on contraception: Secondary | ICD-10-CM

## 2018-09-29 LAB — POCT WET PREP (WET MOUNT)
Clue Cells Wet Prep Whiff POC: POSITIVE
Trichomonas Wet Prep HPF POC: ABSENT

## 2018-09-29 LAB — POCT URINE PREGNANCY: Preg Test, Ur: NEGATIVE

## 2018-09-29 MED ORDER — METRONIDAZOLE 500 MG PO TABS: 500.0000 mg | ORAL_TABLET | Freq: Two times a day (BID) | ORAL | 0 refills | Status: DC

## 2018-09-29 MED ORDER — LEVONORGEST-ETH ESTRAD 91-DAY 0.15-0.03 MG PO TABS: 1.0000 | ORAL_TABLET | Freq: Every day | ORAL | 4 refills | Status: DC

## 2018-09-29 NOTE — Progress Notes (Signed)
   Subjective:    Auzhane Slutsky - 28 y.o. female MRN [id number]  Date of birth: 1990/06/15  CC:  Phoua Kotlyar is here for abnormal vaginal discharge.  HPI: Abnormal vaginal discharge - notes that her vaginal discharge has increased and she has has vulvar itchiness and abnormal smell - she often gets bacterial vaginosis and thinks that she currently has this again - she is sexually active with 2 female partners who likely have sex with other people -Would like to be tested for STIs -Last period was in late April and she does not think she is pregnant -Does not currently have birth control but is interested in starting the contraceptive pill  Health Maintenance:  Health Maintenance Due  Topic Date Due  . PAP SMEAR-Modifier  06/04/2017    -  reports that she quit smoking about 6 years ago. She quit smokeless tobacco use about 6 years ago. - Review of Systems: Per HPI. - Past Medical History: Patient Active Problem List   Diagnosis Date Noted  . Encounter for counseling regarding contraception 09/30/2018  . Screening for malignant neoplasm of cervix 09/30/2018  . Possible exposure to STD 09/30/2018  . Allergic reaction 08/09/2017  . Abnormal Pap smear of cervix 07/04/2016  . LBP (low back pain) 10/18/2011  . Vaginal discharge 12/11/2010  . HIP PAIN, LEFT 02/22/2010  . TOBACCO ABUSE 04/07/2009   - Medications: reviewed and updated   Objective:   Physical Exam BP 102/60   Pulse 93   LMP 09/22/2018 (Approximate)   SpO2 99%  Gen: NAD, alert, cooperative with exam, well-appearing HEENT: NCAT, PERRL, clear conjunctiva GU: Normal vulva, vagina, and cervix, increased amount of white vaginal discharge Skin: no rashes, normal turgor  Neuro: no gross deficits.  Psych: good insight, alert and oriented        Assessment & Plan:   Vaginal discharge Wet prep positive for clue cells, so metronidazole 500 mg twice daily for 7 days was prescribed.  Will await gonorrhea  and chlamydia results.  Instructed patient to abstain from alcohol while taking metronidazole.  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  Encounter for counseling regarding contraception Patient was counseled on the importance of taking birth control pills every day.  She would like to not have a monthly period, so a 91-day pack was prescribed with 4 refills.  He was encouraged to continue to use condoms every time she has sex to protect from STIs since contraception will not be effective for this.  Possible exposure to STD Testing for trichomonas, GC/chlamydia, RPR, and HIV were performed today.  Patient was advised that we will call her if any of her results are abnormal.    Lezlie Octave, M.D. 09/30/2018, 9:41 AM PGY-2, [facility]

## 2018-09-29 NOTE — Patient Instructions (Addendum)
It was nice meeting you today Ms. Sparks!  I have sent birth control pills to your pharmacy.  Please take 1 pill/day and let me know if you have any issues with this medication.  Please also use condoms for STI prevention.  We will let you know if any of your test today are positive and if you need any medication, we will send it to your pharmacy.    If you have any questions or concerns, please feel free to call the clinic.   Be well,  Dr. Frances Furbish

## 2018-09-30 DIAGNOSIS — Z124 Encounter for screening for malignant neoplasm of cervix: Secondary | ICD-10-CM | POA: Insufficient documentation

## 2018-09-30 DIAGNOSIS — Z3009 Encounter for other general counseling and advice on contraception: Secondary | ICD-10-CM | POA: Insufficient documentation

## 2018-09-30 DIAGNOSIS — Z202 Contact with and (suspected) exposure to infections with a predominantly sexual mode of transmission: Secondary | ICD-10-CM | POA: Insufficient documentation

## 2018-09-30 HISTORY — DX: Contact with and (suspected) exposure to infections with a predominantly sexual mode of transmission: Z20.2

## 2018-09-30 HISTORY — DX: Encounter for screening for malignant neoplasm of cervix: Z12.4

## 2018-09-30 LAB — RPR: RPR Ser Ql: NONREACTIVE

## 2018-09-30 LAB — HIV ANTIBODY (ROUTINE TESTING W REFLEX): HIV Screen 4th Generation wRfx: NONREACTIVE

## 2018-09-30 NOTE — Assessment & Plan Note (Signed)
Wet prep positive for clue cells, so metronidazole 500 mg twice daily for 7 days was prescribed.  Will await gonorrhea and chlamydia results.  Instructed patient to abstain from alcohol while taking metronidazole.

## 2018-09-30 NOTE — Assessment & Plan Note (Signed)
Patient was counseled on the importance of taking birth control pills every day.  She would like to not have a monthly period, so a 91-day pack was prescribed with 4 refills.  He was encouraged to continue to use condoms every time she has sex to protect from STIs since contraception will not be effective for this.

## 2018-09-30 NOTE — Assessment & Plan Note (Signed)
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

## 2018-09-30 NOTE — Assessment & Plan Note (Signed)
Testing for trichomonas, GC/chlamydia, RPR, and HIV were performed today.  Patient was advised that we will call her if any of her results are abnormal.

## 2018-10-01 LAB — CERVICOVAGINAL ANCILLARY ONLY
Chlamydia: POSITIVE — AB
Neisseria Gonorrhea: NEGATIVE

## 2018-10-02 ENCOUNTER — Other Ambulatory Visit: Payer: Self-pay | Admitting: Family Medicine

## 2018-10-02 ENCOUNTER — Telehealth: Payer: Self-pay | Admitting: Family Medicine

## 2018-10-02 LAB — CYTOLOGY - PAP

## 2018-10-02 MED ORDER — AZITHROMYCIN 500 MG PO TABS: 1000.0000 mg | ORAL_TABLET | Freq: Once | ORAL | 0 refills | Status: AC

## 2018-10-02 NOTE — Telephone Encounter (Signed)
Called patient twice to inform her of her test results, but patient did not answer, so left a voicemail.  Told her that her HIV and RPR were negative, but her pap smear showed LSIL, so she will need a colposcopy.  I asked her to make an appointment in our colposcopy clinic as soon as she can so that her cervix can be examined for any precancerous changes.  I also informed her that her chlamydia test was positive.  I told her that I have sent the antibiotic azithromycin to her pharmacy, and she should take the complete dose as directed on the medicine bottle.  I also advised that she inform her sexual partners of her result and she should abstain from having sex with them until she and they are treated.    Candida was found on her pap smear as well.  According to medical literature, this does not need to be treated and is found on about 20% of pap smears.    Patient will need repeat chlamydia testing in about 3 months to test for reexposure.  Amanda C. Frances Furbish, MD PGY-2, [facility] 10/02/2018 2:37 PM

## 2018-10-03 ENCOUNTER — Telehealth: Payer: Self-pay | Admitting: *Deleted

## 2018-10-03 ENCOUNTER — Telehealth: Payer: Self-pay | Admitting: Family Medicine

## 2018-10-03 NOTE — Telephone Encounter (Signed)
Great, thank you Shanda Bumps!

## 2018-10-03 NOTE — Telephone Encounter (Signed)
I called Ms. Andalon again today to try to ensure that she got the message that I left yesterday on her voicemail.  I got her voicemail again and asked her to please call our clinic to let us know that she has received the message and to let us know if she has any questions about what I told her on that message.  I told her that I will be sending a certified letter if we do not hear from her since the information on this message was very important.  Cavin Longman C. Frances Furbish, MD PGY-2, [facility] 10/03/2018 9:46 AM

## 2018-10-03 NOTE — Telephone Encounter (Signed)
Patient returned call.  Informed of message from MD.  Appt made in colpo clinic. Jone Baseman, CMA

## 2018-10-03 NOTE — Telephone Encounter (Signed)
Communicable Disease report faxed to [facility]. Tammra Pressman Zimmerman Rumple, CMA

## 2018-10-06 ENCOUNTER — Telehealth: Payer: Self-pay

## 2018-10-06 ENCOUNTER — Other Ambulatory Visit: Payer: Self-pay | Admitting: Family Medicine

## 2018-10-06 MED ORDER — AZITHROMYCIN 500 MG PO TABS: 1000.0000 mg | ORAL_TABLET | Freq: Once | ORAL | 0 refills | Status: AC

## 2018-10-06 NOTE — Telephone Encounter (Signed)
Pt called nurse line stating she recently took 1 gram of azithromycin that was called into her pharmacy, and ended up throwing up the medication " " later. Pt is requesting another course to be sent in. Please advise.

## 2018-10-06 NOTE — Telephone Encounter (Signed)
New prescription for azithromycin sent.  Thanks!

## 2018-10-07 NOTE — Telephone Encounter (Signed)
LVM to call office to inform pt of below.Shannon Randolph, CMA  

## 2018-10-07 NOTE — Telephone Encounter (Signed)
Pt returned call.  Informed of the below. Jone Baseman, CMA

## 2018-10-13 ENCOUNTER — Other Ambulatory Visit: Payer: Self-pay | Admitting: Family Medicine

## 2018-10-13 ENCOUNTER — Telehealth: Payer: Self-pay

## 2018-10-13 MED ORDER — DOXYCYCLINE HYCLATE 100 MG PO TABS: 100.0000 mg | ORAL_TABLET | Freq: Two times a day (BID) | ORAL | 0 refills | Status: DC

## 2018-10-13 NOTE — Telephone Encounter (Signed)
Pt calls nurse line again stating she threw up the second dose of azithromycin. Pt did say she did take with food, still didn't help. Pt is requesting a different medication to be sent in. She dose not want 1 gram of azithromycin again, since she can not tolerate. Please advise.

## 2018-10-13 NOTE — Telephone Encounter (Signed)
I sent in a prescription for doxycycline 100 mg BID for 7 days.  Please tell patient that she must take all of this medication as directed to properly treat her infection.  Thanks.

## 2018-10-13 NOTE — Progress Notes (Signed)
Sending doxycycline for chlamydia after patient vomited after taking azithromycin x 2.

## 2018-10-13 NOTE — Telephone Encounter (Signed)
Pt contacted and informed.

## 2018-10-16 ENCOUNTER — Other Ambulatory Visit (HOSPITAL_COMMUNITY)
Admission: RE | Admit: 2018-10-16 | Discharge: 2018-10-16 | Disposition: A | Discharge status: Home / Self Care | Payer: Medicaid Other | Source: Ambulatory Visit | Attending: Family Medicine | Admitting: Family Medicine

## 2018-10-16 ENCOUNTER — Other Ambulatory Visit: Payer: Self-pay

## 2018-10-16 ENCOUNTER — Ambulatory Visit (INDEPENDENT_AMBULATORY_CARE_PROVIDER_SITE_OTHER): Payer: Medicaid Other | Admitting: Family Medicine

## 2018-10-16 VITALS — BP 102/62

## 2018-10-16 DIAGNOSIS — R87612 Low grade squamous intraepithelial lesion on cytologic smear of cervix (LGSIL): Secondary | ICD-10-CM | POA: Insufficient documentation

## 2018-10-16 LAB — POCT URINE PREGNANCY: Preg Test, Ur: NEGATIVE

## 2018-10-16 NOTE — Patient Instructions (Addendum)
Dear Shannon Randolph,   It was very nice to see you! Thank you for taking your time to come in to be seen. Today, we discussed the following:   Colposcopy   We did not take any biopsies as there was nothing concerning under the microscope.   Please follow up testing in one year   Be well,   Dr. Genia Hotterachel Jatinder Mcdonagh [facility] [phone]   Sign up for MyChart for instant access to your health profile, labs, orders, upcoming appointments or to contact your provider with questions.    Colposcopy, Care After This sheet gives you information about how to care for yourself after your procedure. Your doctor may also give you more specific instructions. If you have problems or questions, contact your doctor. What can I expect after the procedure? If you did not have a tissue sample removed (did not have a biopsy), you may only have some spotting for a few days. You can go back to your normal activities. If you had a tissue sample removed, it is common to have:  Soreness and pain. This may last for a few days.  Light-headedness.  Mild bleeding from your vagina or dark-colored, grainy discharge from your vagina. This may last for a few days. You may need to wear a sanitary pad.  Spotting for at least 48 hours after the procedure. Follow these instructions at home:   Take over-the-counter and prescription medicines only as told by your doctor. Ask your doctor what medicines you can start taking again. This is very important if you take blood-thinning medicine.  Do not drive or use heavy machinery while taking prescription pain medicine.  For 3 days, or as long as your doctor tells you, avoid: ? Douching. ? Using tampons. ? Having sex.  If you use birth control (contraception), keep using it.  Limit activity for the first day after the procedure. Ask your doctor what activities are safe for you.  It is up to you to get the results of your procedure. Ask your doctor  when your results will be ready.  Keep all follow-up visits as told by your doctor. This is important. Contact a doctor if:  You get a skin rash. Get help right away if:  You are bleeding a lot from your vagina. It is a lot of bleeding if you are using more than one pad an hour for 2 hours in a row.  You have clumps of blood (blood clots) coming from your vagina.  You have a fever.  You have chills  You have pain in your lower belly (pelvic area).  You have signs of infection, such as vaginal discharge that is: ? Different than usual. ? Yellow. ? Bad-smelling.  You have very pain or cramps in your lower belly that do not get better with medicine.  You feel light-headed.  You feel dizzy.  You pass out (faint). Summary  If you did not have a tissue sample removed (did not have a biopsy), you may only have some spotting for a few days. You can go back to your normal activities.  If you had a tissue sample removed, it is common to have mild pain and spotting for 48 hours.  For 3 days, or as long as your doctor tells you, avoid douching, using tampons and having sex.  Get help right away if you have bleeding, very bad pain, or signs of infection. This information is not intended to replace advice given to you by  your health care provider. Make sure you discuss any questions you have with your health care provider. Document Released: 10/31/2007 Document Revised: 02/01/2016 Document Reviewed: 02/01/2016 Elsevier Interactive Patient Education  2019 [employer].

## 2018-10-16 NOTE — Progress Notes (Signed)
cy

## 2018-10-17 NOTE — Progress Notes (Signed)
LGSIL 2018 colpo negative by report Pap this year LGSIL also Asymptomatic  Patient given informed consent, signed copy in the chart.  Placed in lithotomy position. Cervix viewed with speculum and colposcope after application of acetic acid.   Colposcopy adequate (entire squamocolumnar junctions seen  in entirety) ?  yes Acetowhite lesions? none Punctation? none Mosaicism?  none Abnormal vasculature?  no Biopsies? none ECC? no Complications? none  COMMENTS:  Patient was given post procedure instructions.    LGSIL on last 2 paps with clinically negative colposcopy in between by her report. Will  getHPV typing today and make recommendations on follow up based on those results.

## 2018-10-21 ENCOUNTER — Other Ambulatory Visit: Payer: Self-pay | Admitting: Family Medicine

## 2018-10-21 DIAGNOSIS — N200 Calculus of kidney: Secondary | ICD-10-CM | POA: Diagnosis not present

## 2018-10-21 DIAGNOSIS — Z202 Contact with and (suspected) exposure to infections with a predominantly sexual mode of transmission: Secondary | ICD-10-CM

## 2018-10-21 DIAGNOSIS — N2889 Other specified disorders of kidney and ureter: Secondary | ICD-10-CM | POA: Diagnosis not present

## 2018-10-21 DIAGNOSIS — N3000 Acute cystitis without hematuria: Secondary | ICD-10-CM | POA: Diagnosis not present

## 2018-10-21 NOTE — Telephone Encounter (Signed)
Would you call and let patient know that we will retest her for chlamydia and gonorrhea before prescribing more medication, since she may have been adequately treated with the first days of doxycycline.  Please ask her for her availability and make a lab appointment for urine GC/Chlamydia.  I will put the future order in.  Thanks!

## 2018-10-21 NOTE — Telephone Encounter (Signed)
Pt is calling and would like speak with a nurse or Dr. Frances Furbish concerning her medication. She continued taking the Doxycycline but threw it up day 6 and 7. She said she believes that is why she does not feel better.

## 2018-10-22 ENCOUNTER — Telehealth: Payer: Self-pay

## 2018-10-22 NOTE — Telephone Encounter (Signed)
Error

## 2018-10-22 NOTE — Telephone Encounter (Signed)
Called pt. Pt went to ED. Treated for UTI. Sunday Spillers, CMA

## 2018-10-23 LAB — CYTOLOGY - PAP
HPV 16/18/45 genotyping: NEGATIVE
HPV: DETECTED — AB

## 2018-12-04 DIAGNOSIS — F419 Anxiety disorder, unspecified: Secondary | ICD-10-CM | POA: Diagnosis not present

## 2018-12-04 DIAGNOSIS — R0602 Shortness of breath: Secondary | ICD-10-CM | POA: Diagnosis not present

## 2018-12-04 DIAGNOSIS — R000 Tachycardia, unspecified: Secondary | ICD-10-CM | POA: Diagnosis not present

## 2019-01-21 ENCOUNTER — Encounter: Payer: Self-pay | Admitting: Family Medicine

## 2019-01-21 ENCOUNTER — Other Ambulatory Visit (HOSPITAL_COMMUNITY)
Admission: RE | Admit: 2019-01-21 | Discharge: 2019-01-21 | Disposition: A | Discharge status: Home / Self Care | Payer: Medicaid Other | Source: Ambulatory Visit | Attending: Family Medicine | Admitting: Family Medicine

## 2019-01-21 ENCOUNTER — Other Ambulatory Visit: Payer: Self-pay

## 2019-01-21 ENCOUNTER — Ambulatory Visit (INDEPENDENT_AMBULATORY_CARE_PROVIDER_SITE_OTHER): Payer: Medicaid Other | Admitting: Family Medicine

## 2019-01-21 VITALS — BP 104/72 | HR 114

## 2019-01-21 DIAGNOSIS — R8290 Unspecified abnormal findings in urine: Secondary | ICD-10-CM | POA: Diagnosis not present

## 2019-01-21 DIAGNOSIS — Z3009 Encounter for other general counseling and advice on contraception: Secondary | ICD-10-CM | POA: Diagnosis not present

## 2019-01-21 DIAGNOSIS — N898 Other specified noninflammatory disorders of vagina: Secondary | ICD-10-CM

## 2019-01-21 DIAGNOSIS — B9689 Other specified bacterial agents as the cause of diseases classified elsewhere: Secondary | ICD-10-CM | POA: Diagnosis not present

## 2019-01-21 DIAGNOSIS — R000 Tachycardia, unspecified: Secondary | ICD-10-CM | POA: Diagnosis not present

## 2019-01-21 DIAGNOSIS — R740 Nonspecific elevation of levels of transaminase and lactic acid dehydrogenase [LDH]: Secondary | ICD-10-CM

## 2019-01-21 DIAGNOSIS — R7401 Elevation of levels of liver transaminase levels: Secondary | ICD-10-CM | POA: Insufficient documentation

## 2019-01-21 DIAGNOSIS — Z114 Encounter for screening for human immunodeficiency virus [HIV]: Secondary | ICD-10-CM | POA: Diagnosis not present

## 2019-01-21 DIAGNOSIS — N760 Acute vaginitis: Secondary | ICD-10-CM | POA: Diagnosis not present

## 2019-01-21 DIAGNOSIS — Z309 Encounter for contraceptive management, unspecified: Secondary | ICD-10-CM | POA: Insufficient documentation

## 2019-01-21 HISTORY — DX: Elevation of levels of liver transaminase levels: R74.01

## 2019-01-21 LAB — POCT URINALYSIS DIP (MANUAL ENTRY)
Bilirubin, UA: NEGATIVE
Glucose, UA: NEGATIVE mg/dL
Ketones, POC UA: NEGATIVE mg/dL
Leukocytes, UA: NEGATIVE
Nitrite, UA: NEGATIVE
Protein Ur, POC: NEGATIVE mg/dL
Spec Grav, UA: 1.02 (ref 1.010–1.025)
Urobilinogen, UA: 0.2 E.U./dL
pH, UA: 7 (ref 5.0–8.0)

## 2019-01-21 LAB — POCT WET PREP (WET MOUNT)
Clue Cells Wet Prep Whiff POC: POSITIVE
Trichomonas Wet Prep HPF POC: ABSENT

## 2019-01-21 LAB — POCT URINE PREGNANCY: Preg Test, Ur: NEGATIVE

## 2019-01-21 MED ORDER — PRE-NATAL FORMULA PO TABS: 1.0000 | ORAL_TABLET | Freq: Every day | ORAL | 5 refills | Status: AC

## 2019-01-21 MED ORDER — METRONIDAZOLE 500 MG PO TABS: 500.0000 mg | ORAL_TABLET | Freq: Two times a day (BID) | ORAL | 0 refills | Status: DC

## 2019-01-21 NOTE — Patient Instructions (Signed)
It was a pleasure to see you today! Thank you for choosing [facility] for your primary care. Shannon Randolph was seen for discharge and urine odor. Come back to the clinic if you need anything.  Today we talked about a number of issues.  The first was your complaint of urine odor and discharge, we ordered a urinalysis and did some swabs to check for infection.  We also talked about birth control.  We advised that you could use the birth control you have Artie picked up because he only picked it up 2 months ago, you should continue to use this regularly because birth control pills are only as effective as you are consistent.  As you are about to start we are ordering a urine pregnancy test for this morning.  You can continue taking a prenatal vitamin in case you become pregnant and for your own general health.  We also noticed that your heart rate is slightly above average and so we are ordering some blood work to check for any reasons that might need treatment coming from that.  We understand that he said they have just been dealing with a stressful relationship this morning that you did not want to discuss.  If there is anything that we can do to help you out with that let us know in the future.    Please bring all your medications to every doctors visit   Sign up for My Chart to have easy access to your labs results, and communication with your Primary care physician.     Please check-out at the front desk before leaving the clinic.     Best,  Dr. Sherene Sires FAMILY MEDICINE RESIDENT - PGY3 01/21/2019 8:52 AM

## 2019-01-21 NOTE — Assessment & Plan Note (Signed)
Patient with complaint of mild vaginal discharge and mild itchiness, she said that she is sexually active with a partner but has no complaints of dysuria although she does have a complaint of abnormal urine odor to her.  Patient consents to swab along with concurrent STD testing.

## 2019-01-21 NOTE — Assessment & Plan Note (Signed)
Patient is sexually active but not using birth control this time.  She was under the impression that since she was only given 1 month of pills at a time that this was going to be a trial and she was not going to be given medication in the future.  We corrected that miscommunication and patient understands that there are refills at the pharmacy for her.  Understands that pills are only as effective as she is consistent.  We will order a urine pregnancy today, she will continue using prenatal vitamins in case she becomes pregnant and is for her own general health but will start using birth control as her goal is to not become pregnant.

## 2019-01-21 NOTE — Progress Notes (Signed)
Subjective:  Shannon SlickerRashanda Randolph is a 28 y.o. female [facility] with a chief complaint of vaginal discharge/urine odor.   HPI: Abnormal urine odor/ Vaginal discharge Patient with complaint of mild vaginal discharge and mild itchiness, she said that she is sexually active with a partner but has no complaints of dysuria although she does have a complaint of abnormal urine odor.  Contraceptive management Patient is sexually active but not using birth control this time.  She was under the impression that since she was only given 1 month of pills at a time that this was going to be a trial and she was not going to be given medication in the future.  We corrected that miscommunication and patient understands that there are refills at the pharmacy for her.    Screening for STD Patient commenced to STD screening along with her current testing today for visit.  Tachycardia Patient with mild tachycardia to the 110s, apparently this was noted issue in the past but had been marked as resolved.  Patient says that she is not having chest pain but that she is just been stressed out lately.  She does consent to some testing.   Objective:  Physical Exam: BP 104/72   Pulse (!) 114   LMP 12/31/2018 (Approximate)   SpO2 98%   Gen: NAD, conversing comfortably CV: tachycardic with no murmurs appreciated Pulm: NWOB, CTAB with no crackles, wheezes, or rhonchi GI: Normal bowel sounds present. Soft, Nontender, Nondistended. GU: vaginal exam/swabperformed entirely with Jazmine CMA in room, no visual abnormalities externally, there is scant white discharge with no bleeding or visualized scars/lesions/pathology MSK: no edema, cyanosis, or clubbing noted Skin: warm, dry Neuro: grossly normal, moves all extremities Psych: Normal affect and thought content  No results found for this or any previous visit (from the past 72 hour(s)).   Assessment/Plan:  Elevated transaminase level Patient  with minimally elevated transaminase levels on last CMP, has not been drawn recently and patient also has mild tachycardia to 110s.  Redraw CMP  Abnormal urine odor UA with reflex culture ordered because patient is complaining of abnormal urine order.  No dysuria.  Contraceptive management Patient is sexually active but not using birth control this time.  She was under the impression that since she was only given 1 month of pills at a time that this was going to be a trial and she was not going to be given medication in the future.  We corrected that miscommunication and patient understands that there are refills at the pharmacy for her.  Understands that pills are only as effective as she is consistent.  We will order a urine pregnancy today, she will continue using prenatal vitamins in case she becomes pregnant and is for her own general health but will start using birth control as her goal is to not become pregnant.  Screening for human immunodeficiency virus Patient commenced to STD screening along with her current testing today for visit.  Tachycardia Patient with mild tachycardia to the 110s, apparently this was noted issue in the past but had been marked as resolved.  Patient says that she is not having chest pain but that she is just been stressed out lately.  She does consent to some testing.  We will order a TSH which has not been done before as well as a CBC in case there is anemia although last blood levels were normal, patient did have prior mildly elevated transaminases on CMP so  we will redraw this as well.  Vaginal discharge Patient with complaint of mild vaginal discharge and mild itchiness, she said that she is sexually active with a partner but has no complaints of dysuria although she does have a complaint of abnormal urine odor to her.  Patient consents to swab along with concurrent STD testing.   Sherene Sires, DO FAMILY MEDICINE RESIDENT - PGY3 01/21/2019 9:03 AM

## 2019-01-21 NOTE — Assessment & Plan Note (Signed)
Patient with mild tachycardia to the 110s, apparently this was noted issue in the past but had been marked as resolved.  Patient says that she is not having chest pain but that she is just been stressed out lately.  She does consent to some testing.  We will order a TSH which has not been done before as well as a CBC in case there is anemia although last blood levels were normal, patient did have prior mildly elevated transaminases on CMP so we will redraw this as well.

## 2019-01-21 NOTE — Assessment & Plan Note (Signed)
UA with reflex culture ordered because patient is complaining of abnormal urine order.  No dysuria.

## 2019-01-21 NOTE — Assessment & Plan Note (Signed)
Patient commenced to STD screening along with her current testing today for visit.

## 2019-01-21 NOTE — Assessment & Plan Note (Signed)
Positive with and clue cells on exam today, metro prescribed and patient called/notified by Dr. Criss Rosales

## 2019-01-21 NOTE — Assessment & Plan Note (Signed)
Patient with minimally elevated transaminase levels on last CMP, has not been drawn recently and patient also has mild tachycardia to 110s.  Redraw CMP

## 2019-01-22 LAB — CBC
Hematocrit: 40.7 % (ref 34.0–46.6)
Hemoglobin: 13.8 g/dL (ref 11.1–15.9)
MCH: 28.7 pg (ref 26.6–33.0)
MCHC: 33.9 g/dL (ref 31.5–35.7)
MCV: 85 fL (ref 79–97)
Platelets: 261 10*3/uL (ref 150–450)
RBC: 4.81 x10E6/uL (ref 3.77–5.28)
RDW: 12.6 % (ref 11.7–15.4)
WBC: 9.9 10*3/uL (ref 3.4–10.8)

## 2019-01-22 LAB — COMPREHENSIVE METABOLIC PANEL
ALT: 9 IU/L (ref 0–32)
AST: 11 IU/L (ref 0–40)
Albumin/Globulin Ratio: 2.2 (ref 1.2–2.2)
Albumin: 4.8 g/dL (ref 3.9–5.0)
Alkaline Phosphatase: 77 IU/L (ref 39–117)
BUN/Creatinine Ratio: 14 (ref 9–23)
BUN: 12 mg/dL (ref 6–20)
Bilirubin Total: 0.3 mg/dL (ref 0.0–1.2)
CO2: 19 mmol/L — ABNORMAL LOW (ref 20–29)
Calcium: 9.6 mg/dL (ref 8.7–10.2)
Chloride: 103 mmol/L (ref 96–106)
Creatinine, Ser: 0.86 mg/dL (ref 0.57–1.00)
GFR calc Af Amer: 105 mL/min/{1.73_m2} (ref >59)
GFR calc non Af Amer: 91 mL/min/{1.73_m2} (ref >59)
Globulin, Total: 2.2 g/dL (ref 1.5–4.5)
Glucose: 109 mg/dL — ABNORMAL HIGH (ref 65–99)
Potassium: 4.4 mmol/L (ref 3.5–5.2)
Sodium: 139 mmol/L (ref 134–144)
Total Protein: 7 g/dL (ref 6.0–8.5)

## 2019-01-22 LAB — TSH: TSH: 1.07 u[IU]/mL (ref 0.450–4.500)

## 2019-01-23 ENCOUNTER — Encounter: Payer: Self-pay | Admitting: Family Medicine

## 2019-01-23 LAB — CERVICOVAGINAL ANCILLARY ONLY
Chlamydia: NEGATIVE
Neisseria Gonorrhea: NEGATIVE

## 2019-01-23 NOTE — Progress Notes (Signed)
Result letter, only finding requiring treatment was bacterial vaginosis which we already sent in metronidazole for. CBG only minimally elevated and was not fasting.   -Dr. Criss Rosales

## 2019-06-09 ENCOUNTER — Ambulatory Visit (INDEPENDENT_AMBULATORY_CARE_PROVIDER_SITE_OTHER): Payer: Medicaid Other | Admitting: Family Medicine

## 2019-06-09 ENCOUNTER — Other Ambulatory Visit (HOSPITAL_COMMUNITY)
Admission: RE | Admit: 2019-06-09 | Discharge: 2019-06-09 | Disposition: A | Discharge status: Home / Self Care | Payer: Medicaid Other | Source: Ambulatory Visit | Attending: Family Medicine | Admitting: Family Medicine

## 2019-06-09 ENCOUNTER — Encounter: Payer: Self-pay | Admitting: Family Medicine

## 2019-06-09 ENCOUNTER — Other Ambulatory Visit: Payer: Self-pay

## 2019-06-09 VITALS — BP 110/80 | HR 83 | Wt 160.6 lb

## 2019-06-09 DIAGNOSIS — N898 Other specified noninflammatory disorders of vagina: Secondary | ICD-10-CM | POA: Diagnosis not present

## 2019-06-09 DIAGNOSIS — N760 Acute vaginitis: Secondary | ICD-10-CM | POA: Diagnosis not present

## 2019-06-09 DIAGNOSIS — Z23 Encounter for immunization: Secondary | ICD-10-CM | POA: Diagnosis not present

## 2019-06-09 DIAGNOSIS — R87612 Low grade squamous intraepithelial lesion on cytologic smear of cervix (LGSIL): Secondary | ICD-10-CM

## 2019-06-09 DIAGNOSIS — Z124 Encounter for screening for malignant neoplasm of cervix: Secondary | ICD-10-CM

## 2019-06-09 DIAGNOSIS — B9689 Other specified bacterial agents as the cause of diseases classified elsewhere: Secondary | ICD-10-CM

## 2019-06-09 LAB — POCT WET PREP (WET MOUNT)
Clue Cells Wet Prep Whiff POC: POSITIVE
Trichomonas Wet Prep HPF POC: ABSENT

## 2019-06-09 MED ORDER — METRONIDAZOLE 500 MG PO TABS: 500.0000 mg | ORAL_TABLET | Freq: Two times a day (BID) | ORAL | 0 refills | Status: DC

## 2019-06-09 NOTE — Progress Notes (Cosign Needed)
   Subjective:    Patient ID: Shannon Randolph, female    DOB: 12-04-1990, 29 y.o.   MRN: [id number]   CC: Shannon Randolph is a 29 yr old female who presents today with BV symptoms   HPI:  Bacterial vaginosis Has hx of previous BV infections. Presents today with 2-3 history of white discharge which has an offensive, fishy smell. Pt feels this is similar to previous BV infections. Denies pruritis, abnormal vaginal bleed. Is sexually active with female partner. Denies multiple sexual partners. Previously tested for STDs and thinks she may have been treated for chlamydia in the past.  Pap smear Last pap smear was in 09/29/2018: LSIL with HPV. Has LSIL on 5/21/200 and 06/04/2016. Has not followed up for repeat pap following most recent pap smear.   Smoking status reviewed   ROS: pertinent noted in the HPI    Past medical history, surgical, family, and social history reviewed and updated in the EMR as appropriate. Reviewed problem list.   Objective:  BP 110/80   Pulse 83   Wt 160 lb 9.6 oz (72.8 kg)   LMP 05/27/2019 (Approximate)   SpO2 99%   BMI 28.45 kg/m   Vitals and nursing note reviewed  General: NAD, pleasant, able to participate in exam Cardiac: RRR, S1 S2 present. normal heart sounds, no murmurs. Respiratory: CTAB, normal effort, No wheezes, rales or rhonchi Extremities: no edema or cyanosis. Skin: warm and dry, no rashes noted Neuro: alert, no obvious focal deficits Psych: Normal affect and mood  Wet prep, G&C and pap smear performed with CMA Shelly as chaperone. Pelvic exam: no adnexal tenderness or masses palpated within the pelvis.   Assessment & Plan:    Bacterial vaginosis Wet prep positive for BV. Treated with Metronidazole. Follow up with PCP if symptoms persist.   Screening for malignant neoplasm of cervix Repeated pap smear today given hx of LSIL. Will contact patient of results.    Towanda Octave, MD  [facility] PGY-1

## 2019-06-09 NOTE — Patient Instructions (Addendum)
Zakyria,  It was lovely to meet you today!! Today we did an STD check and checked for BV and trichomonas. I will be in touch with the results.  Best wishes,  Joffrey Kerce

## 2019-06-10 ENCOUNTER — Telehealth: Payer: Self-pay | Admitting: *Deleted

## 2019-06-10 ENCOUNTER — Encounter: Payer: Self-pay | Admitting: Family Medicine

## 2019-06-10 LAB — CERVICOVAGINAL ANCILLARY ONLY
Chlamydia: NEGATIVE
Comment: NEGATIVE
Comment: NORMAL
Neisseria Gonorrhea: NEGATIVE

## 2019-06-10 NOTE — Telephone Encounter (Signed)
Result letter mailed. Gitel Beste Zimmerman Rumple, CMA

## 2019-06-11 NOTE — Assessment & Plan Note (Signed)
Repeated pap smear today given hx of LSIL. Will contact patient of results.

## 2019-06-11 NOTE — Assessment & Plan Note (Addendum)
Wet prep positive for BV with clue cells. Treated with Metronidazole. Follow up with PCP if symptoms persist.

## 2019-06-12 LAB — CYTOLOGY - PAP
Comment: NEGATIVE
High risk HPV: POSITIVE — AB

## 2019-06-15 ENCOUNTER — Telehealth: Payer: Self-pay | Admitting: Family Medicine

## 2019-06-15 NOTE — Telephone Encounter (Signed)
Called pt to inform her of pap smear results. I have referred her to colpo clinic for LSIL.

## 2019-06-19 ENCOUNTER — Telehealth: Payer: Self-pay | Admitting: *Deleted

## 2019-06-19 NOTE — Telephone Encounter (Signed)
LVM to call office back.  If she calls back please assist her in getting an appointment in colpo clinic per Dr. Allena Katz. Shannon Randolph, CMA

## 2019-06-19 NOTE — Telephone Encounter (Signed)
-----   Message from Towanda Octave, MD sent at 06/15/2019 11:55 AM EST ----- Regarding: colpo clinic Hi team!!  Please could you refer this pt to our colpo clinic please?   Thanks! Poonam

## 2019-07-01 NOTE — Telephone Encounter (Signed)
LVM to call office to get her an appointment scheduled in colpo per Dr. Allena Katz.Shannon Randolph, CMA

## 2019-07-08 NOTE — Telephone Encounter (Signed)
Contacted pt and appointment scheduled for 07/16/2019 @ 9am in [facility]. Retaj Hilbun Zimmerman Rumple, CMA

## 2019-07-16 ENCOUNTER — Ambulatory Visit: Payer: Medicaid Other

## 2019-11-27 ENCOUNTER — Encounter (HOSPITAL_COMMUNITY): Payer: Self-pay | Admitting: Emergency Medicine

## 2019-11-27 ENCOUNTER — Emergency Department (HOSPITAL_COMMUNITY)
Admission: EM | Admit: 2019-11-27 | Discharge: 2019-11-27 | Disposition: A | Discharge status: Home / Self Care | Payer: Medicaid Other | Attending: Emergency Medicine | Admitting: Emergency Medicine

## 2019-11-27 ENCOUNTER — Other Ambulatory Visit: Payer: Self-pay

## 2019-11-27 ENCOUNTER — Emergency Department (HOSPITAL_COMMUNITY): Payer: Medicaid Other

## 2019-11-27 DIAGNOSIS — Y92511 Restaurant or cafe as the place of occurrence of the external cause: Secondary | ICD-10-CM | POA: Insufficient documentation

## 2019-11-27 DIAGNOSIS — Y939 Activity, unspecified: Secondary | ICD-10-CM | POA: Insufficient documentation

## 2019-11-27 DIAGNOSIS — S99911A Unspecified injury of right ankle, initial encounter: Secondary | ICD-10-CM | POA: Diagnosis present

## 2019-11-27 DIAGNOSIS — S82831A Other fracture of upper and lower end of right fibula, initial encounter for closed fracture: Secondary | ICD-10-CM | POA: Diagnosis not present

## 2019-11-27 DIAGNOSIS — S82891A Other fracture of right lower leg, initial encounter for closed fracture: Secondary | ICD-10-CM

## 2019-11-27 DIAGNOSIS — S89391A Other physeal fracture of lower end of right fibula, initial encounter for closed fracture: Secondary | ICD-10-CM | POA: Insufficient documentation

## 2019-11-27 DIAGNOSIS — S8251XA Displaced fracture of medial malleolus of right tibia, initial encounter for closed fracture: Secondary | ICD-10-CM | POA: Diagnosis not present

## 2019-11-27 DIAGNOSIS — Y999 Unspecified external cause status: Secondary | ICD-10-CM | POA: Diagnosis not present

## 2019-11-27 DIAGNOSIS — X500XXA Overexertion from strenuous movement or load, initial encounter: Secondary | ICD-10-CM | POA: Diagnosis not present

## 2019-11-27 MED ORDER — IBUPROFEN 800 MG PO TABS
800.0000 mg | ORAL_TABLET | Freq: Once | ORAL | Status: DC
  Filled 2019-11-27: qty 2

## 2019-11-27 MED ORDER — OXYCODONE-ACETAMINOPHEN 5-325 MG PO TABS: 1.0000 | ORAL_TABLET | ORAL | 0 refills | Status: DC | PRN

## 2019-11-27 MED ORDER — IBUPROFEN 800 MG PO TABS
800.0000 mg | ORAL_TABLET | Freq: Three times a day (TID) | ORAL | 0 refills | Status: DC
Start: 2019-11-27 — End: 2020-06-03

## 2019-11-27 MED ORDER — OXYCODONE-ACETAMINOPHEN 5-325 MG PO TABS
2.0000 | ORAL_TABLET | Freq: Once | ORAL | Status: AC
  Administered 2019-11-27: 2 via ORAL
  Filled 2019-11-27: qty 2

## 2019-11-27 NOTE — ED Notes (Signed)
Advil was given by triage staff and never documented

## 2019-11-27 NOTE — Progress Notes (Signed)
Orthopedic Tech Progress Note Patient Details:  Shannon Randolph 04-14-1991 [id number]  Ortho Devices Type of Ortho Device: Stirrup splint, Short leg splint, Crutches Ortho Device/Splint Location: RLE Ortho Device/Splint Interventions: Application   Post Interventions Patient Tolerated: Well Instructions Provided: Adjustment of device, Poper ambulation with device, Care of device   Shannon Randolph E Charae Depaolis 11/27/2019, 2:43 AM

## 2019-11-27 NOTE — Discharge Instructions (Signed)
Take the prescribed medication as directed.  Try to keep foot/ankle elevated to keep swelling down. Follow-up with Dr. Aundria Rud-- call his office in the morning to get appt scheduled.  They are expecting you. Return to the ED for new or worsening symptoms.

## 2019-11-27 NOTE — ED Notes (Signed)
Pt screaming and cursing at staff, pt and friend ask to leave the lobby due to disruptive behavior.Other patients seen leaving due to fear

## 2019-11-27 NOTE — ED Triage Notes (Signed)
Pt reports twisting R ankle while walking in platform shoes @ 1 hour ago. Swelling noted, ankle bracelet removed. Pt tearful with pain, pt provided ibuprofen.

## 2019-11-27 NOTE — ED Provider Notes (Signed)
[facility] EMERGENCY DEPARTMENT Provider Note   CSN: [id number] Arrival date & time: 11/27/19  0005     History Chief Complaint  Patient presents with  . Ankle Pain    Shannon Randolph is a 29 y.o. female.  The history is provided by the patient and medical records.  Ankle Pain   29 year old female presenting to the ED with right ankle injury.  States she was at a bar downtown wearing platform shoes when she twisted her ankle and fell.  She had immediate onset of pain and swelling to right ankle.  She has been unable to bear weight or ambulate since.  Denies any numbness or weakness of right foot.  No prior ankle injuries in the past.  She was given Motrin in triage.  Past Medical History:  Diagnosis Date  . Bacterial vaginosis   . Infection    UTI, gonorrhea  . No pertinent past medical history     Patient Active Problem List   Diagnosis Date Noted  . Screening for human immunodeficiency virus 01/21/2019  . Contraceptive management 01/21/2019  . Abnormal urine odor 01/21/2019  . Elevated transaminase level 01/21/2019  . Encounter for counseling regarding contraception 09/30/2018  . Screening for malignant neoplasm of cervix 09/30/2018  . Possible exposure to STD 09/30/2018  . Allergic reaction 08/09/2017  . Abnormal Pap smear of cervix 07/04/2016  . Tachycardia   . LBP (low back pain) 10/18/2011  . Bacterial vaginosis 10/16/2011  . Vaginal discharge 12/11/2010  . HIP PAIN, LEFT 02/22/2010  . TOBACCO ABUSE 04/07/2009    Past Surgical History:  Procedure Laterality Date  . NO PAST SURGERIES       OB History    Gravida  7   Para  3   Term  3   Preterm      AB  4   Living  3     SAB      TAB      Ectopic      Multiple  0   Live Births  3           Family History  Problem Relation Age of Onset  . Hearing loss Neg Hx     Social History   Tobacco Use  . Smoking status: Former Smoker    Quit date: 01/11/2012     Years since quitting: 7.8  . Smokeless tobacco: Former Neurosurgeon    Quit date: 01/11/2012  Substance Use Topics  . Alcohol use: No  . Drug use: No    Home Medications Prior to Admission medications   Medication Sig Start Date End Date Taking? Authorizing Provider  levonorgestrel-ethinyl estradiol (SEASONALE) 0.15-0.03 MG tablet Take 1 tablet by mouth daily. 09/29/18   Lennox Solders, MD  metroNIDAZOLE (FLAGYL) 500 MG tablet Take 1 tablet (500 mg total) by mouth 2 (two) times daily. 06/09/19   Towanda Octave, MD  naproxen (NAPROSYN) 500 MG tablet Take 1 tablet (500 mg total) by mouth 2 (two) times daily with a meal. 04/23/17   Almon Hercules, MD    Allergies    Patient has no known allergies.  Review of Systems   Review of Systems  Musculoskeletal: Positive for arthralgias.  All other systems reviewed and are negative.   Physical Exam Updated Vital Signs BP 127/71 (BP Location: Right Arm)   Pulse 84   Temp 98.2 F (36.8 C) (Oral)   Resp 18   Ht 5\' 3"  (1.6 m)   Wt  59 kg   LMP 11/18/2019 Comment: pt shielded  SpO2 100%   BMI 23.03 kg/m   Physical Exam Vitals and nursing note reviewed.  Constitutional:      Appearance: She is well-developed.  HENT:     Head: Normocephalic and atraumatic.  Eyes:     Conjunctiva/sclera: Conjunctivae normal.     Pupils: Pupils are equal, round, and reactive to light.  Cardiovascular:     Rate and Rhythm: Normal rate and regular rhythm.     Heart sounds: Normal heart sounds.  Pulmonary:     Effort: Pulmonary effort is normal.     Breath sounds: Normal breath sounds.  Abdominal:     General: Bowel sounds are normal.     Palpations: Abdomen is soft.  Musculoskeletal:        General: Normal range of motion.     Cervical back: Normal range of motion.     Comments: Right ankle with diffuse swelling, worsen overlying medial and lateral malleoli; exquisite pain with any attempted palpation/movement; she is able to wiggle toes normally, DP pulse  intact, good distal sensation and cap refill  Skin:    General: Skin is warm and dry.  Neurological:     Mental Status: She is alert and oriented to person, place, and time.     ED Results / Procedures / Treatments   Labs (all labs ordered are listed, but only abnormal results are displayed) Labs Reviewed - No data to display  EKG None  Radiology DG Ankle Complete Right  Result Date: 11/27/2019 CLINICAL DATA:  Fall EXAM: RIGHT ANKLE - COMPLETE 3+ VIEW COMPARISON:  None. FINDINGS: There is a suprasyndesmotic distal fibular metadiaphyseal fracture, a transversely oriented medial malleolar fracture, and a minimally displaced vertically oriented posterior malleolar fracture as well. 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 Associated circumferential soft tissue thickening and moderate ankle joint effusion. Mild lateral talar shift is noted with widening of the medial clear space. 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 Constellation of findings suggests an unstable ankle injury likely secondary to a pronation, external rotation mechanism. Associated soft tissue swelling and ankle effusion. Electronically Signed   By: Kreg Shropshire M.D.   On: 11/27/2019 01:26    Procedures Procedures (including critical care time)  Medications Ordered in ED Medications  ibuprofen (ADVIL) tablet 800 mg (has no administration in time range)  oxyCODONE-acetaminophen (PERCOCET/ROXICET) 5-325 MG per tablet 2 tablet (2 tablets Oral Given 11/27/19 0158)    ED Course  I have reviewed the triage vital signs and the nursing notes.  Pertinent labs & imaging results that were available during my care of the patient were reviewed by me and considered in my medical decision making (see chart for details).    MDM Rules/Calculators/A&P  29 year old female presenting to the ED with  right ankle injury.  She was at a bar wearing platform shoes when she twisted her ankle and fell.  Has been unable to bear weight or ambulate since.  She does have diffuse swelling to right ankle, worse along medial and lateral malleoli.  Foot is neurovascularly intact.  X-ray with Weber C, stage IV injury that is unstable.    Case discussed with on call orthopedics, Dr. Aundria Rud-- does not feel she needs CT at this time.  Will place in well padded splint and have her call the office in the morning to  schedule appt for surgery.  Encouraged ice/elevation.  Given Rx for percocet and motrin.  Can return here for any new/acute changes.  Final Clinical Impression(s) / ED Diagnoses Final diagnoses:  Closed fracture of right ankle, initial encounter    Rx / DC Orders ED Discharge Orders         Ordered    oxyCODONE-acetaminophen (PERCOCET) 5-325 MG tablet  Every 4 hours PRN     Discontinue  Reprint     11/27/19 0246    ibuprofen (ADVIL) 800 MG tablet  3 times daily     Discontinue  Reprint     11/27/19 0246           Garlon Hatchet, PA-C 11/27/19 0254    Zadie Rhine, MD 11/28/19 [phone]

## 2019-12-01 DIAGNOSIS — M25571 Pain in right ankle and joints of right foot: Secondary | ICD-10-CM | POA: Diagnosis not present

## 2019-12-01 DIAGNOSIS — S82851A Displaced trimalleolar fracture of right lower leg, initial encounter for closed fracture: Secondary | ICD-10-CM | POA: Diagnosis not present

## 2019-12-07 NOTE — Pre-Procedure Instructions (Signed)
Tiyana Lafever  12/07/2019      [id number] [place], [place] - [address] [place] [address] Phone: [phone] Fax: [phone]  [id number] [place] [place] - [address] [place] [address] Phone: [phone] Fax: [phone]    Your procedure is scheduled on July 16  Report to [facility] Entrance A at 10:15 A.M.  Call this number if you have problems the morning of surgery:  [phone]   Remember:  Do not eat  after midnight.  You may drink clear liquids until 9:15 .  Clear liquids allowed are:                    Water, Juice (non-citric and without pulp - diabetics please choose diet or no sugar options), Carbonated beverages - (diabetics please choose diet or no sugar options), Clear Tea, Black Coffee only (no creamer, milk or cream including half and half), Plain Jell-O only (diabetics please choose diet or no sugar options), Gatorade (diabetics please choose diet or no sugar options) and Plain Popsicles only                  Enhanced Recovery after Surgery for Orthopedics Enhanced Recovery after Surgery is a protocol used to improve the stress on your body and your recovery after surgery.  Patient Instructions   The night before surgery:  o No food after midnight. ONLY clear liquids after midnight     The day of surgery (if you do NOT have diabetes):  o Drink ONE (1) Pre-Surgery Clear Ensure by _____ am the morning of surgery   o This drink was given to you during your hospital  pre-op appointment visit. o Nothing else to drink after completing the  Pre-Surgery Clear Ensure.          If you have questions, please contact your surgeons office.     Take these medicines the morning of surgery with A SIP OF WATER :               Oxycodone if needed                    7 days prior to surgery STOP taking any Aspirin (unless otherwise instructed by your surgeon), Aleve, Naproxen,  Ibuprofen, Motrin, Advil, Goody's, BC's, all herbal medications, fish oil, and all vitamins.    Do not wear jewelry, make-up or nail polish.  Do not wear lotions, powders, or perfumes, or deodorant.  Do not shave 48 hours prior to surgery.  Men may shave face and neck.  Do not bring valuables to the hospital.  [facility] is not responsible for any belongings or valuables.  Contacts, dentures or bridgework may not be worn into surgery.  Leave your suitcase in the car.  After surgery it may be brought to your room.  For patients admitted to the hospital, discharge time will be determined by your treatment team.  Patients discharged the day of surgery will not be allowed to drive home.    Special instructions:  [place]- Preparing For Surgery  Before surgery, you can play an important role. Because skin is not sterile, your skin needs to be as free of germs as possible. You can reduce the number of germs on your skin by washing with CHG (chlorahexidine gluconate) Soap before surgery.  CHG is an antiseptic cleaner which kills germs and bonds with the skin  to continue killing germs even after washing.    Oral Hygiene is also important to reduce your risk of infection.  Remember - BRUSH YOUR TEETH THE MORNING OF SURGERY WITH YOUR REGULAR TOOTHPASTE  Please do not use if you have an allergy to CHG or antibacterial soaps. If your skin becomes reddened/irritated stop using the CHG.  Do not shave (including legs and underarms) for at least 48 hours prior to first CHG shower. It is OK to shave your face.  Please follow these instructions carefully.   1. Shower the NIGHT BEFORE SURGERY and the MORNING OF SURGERY with CHG.   2. If you chose to wash your hair, wash your hair first as usual with your normal shampoo.  3. After you shampoo, rinse your hair and body thoroughly to remove the shampoo.  4. Use CHG as you would any other liquid soap. You can apply CHG directly to the skin and wash  gently with a scrungie or a clean washcloth.   5. Apply the CHG Soap to your body ONLY FROM THE NECK DOWN.  Do not use on open wounds or open sores. Avoid contact with your eyes, ears, mouth and genitals (private parts). Wash Face and genitals (private parts)  with your normal soap.  6. Wash thoroughly, paying special attention to the area where your surgery will be performed.  7. Thoroughly rinse your body with warm water from the neck down.  8. DO NOT shower/wash with your normal soap after using and rinsing off the CHG Soap.  9. Pat yourself dry with a CLEAN TOWEL.  10. Wear CLEAN PAJAMAS to bed the night before surgery, wear comfortable clothes the morning of surgery  11. Place CLEAN SHEETS on your bed the night of your first shower and DO NOT SLEEP WITH PETS.    Day of Surgery:  Do not apply any deodorants/lotions.  Please wear clean clothes to the hospital/surgery center.   Remember to brush your teeth WITH YOUR REGULAR TOOTHPASTE.    Please read over the following fact sheets that you were given. Coughing and Deep Breathing and Surgical Site Infection Prevention

## 2019-12-08 ENCOUNTER — Other Ambulatory Visit (HOSPITAL_COMMUNITY)
Admission: RE | Admit: 2019-12-08 | Discharge: 2019-12-08 | Disposition: A | Discharge status: Home / Self Care | Payer: Medicaid Other | Source: Ambulatory Visit | Attending: Orthopedic Surgery | Admitting: Orthopedic Surgery

## 2019-12-08 ENCOUNTER — Other Ambulatory Visit: Payer: Self-pay

## 2019-12-08 ENCOUNTER — Encounter (HOSPITAL_COMMUNITY): Payer: Self-pay

## 2019-12-08 ENCOUNTER — Encounter (HOSPITAL_COMMUNITY)
Admission: RE | Admit: 2019-12-08 | Discharge: 2019-12-08 | Disposition: A | Discharge status: Home / Self Care | Payer: Medicaid Other | Source: Ambulatory Visit | Attending: Orthopedic Surgery | Admitting: Orthopedic Surgery

## 2019-12-08 DIAGNOSIS — Z01812 Encounter for preprocedural laboratory examination: Secondary | ICD-10-CM | POA: Diagnosis not present

## 2019-12-08 DIAGNOSIS — Z20822 Contact with and (suspected) exposure to covid-19: Secondary | ICD-10-CM | POA: Diagnosis not present

## 2019-12-08 HISTORY — DX: Personal history of urinary calculi: Z87.442

## 2019-12-08 HISTORY — DX: Reserved for inherently not codable concepts without codable children: IMO0001

## 2019-12-08 HISTORY — DX: Complete or unspecified spontaneous abortion without complication: O03.9

## 2019-12-08 LAB — SARS CORONAVIRUS 2 (TAT 6-24 HRS): SARS Coronavirus 2: NEGATIVE

## 2019-12-08 LAB — CBC
HCT: 36.8 % (ref 36.0–46.0)
Hemoglobin: 11.8 g/dL — ABNORMAL LOW (ref 12.0–15.0)
MCH: 29 pg (ref 26.0–34.0)
MCHC: 32.1 g/dL (ref 30.0–36.0)
MCV: 90.4 fL (ref 80.0–100.0)
Platelets: 249 10*3/uL (ref 150–400)
RBC: 4.07 MIL/uL (ref 3.87–5.11)
RDW: 12.7 % (ref 11.5–15.5)
WBC: 10.2 10*3/uL (ref 4.0–10.5)
nRBC: 0 % (ref 0.0–0.2)

## 2019-12-08 LAB — SURGICAL PCR SCREEN
MRSA, PCR: NEGATIVE
Staphylococcus aureus: NEGATIVE

## 2019-12-08 NOTE — Pre-Procedure Instructions (Signed)
Your procedure is scheduled on Friday July 16 from 12:15 PM - 1:55 PM.  Report to [facility] Main Entrance "A" at 10:15 A.M., and check in at the Admitting office.  Call this number if you have problems the morning of surgery:  [phone]  Call [phone] if you have any questions prior to your surgery date Monday-Friday 8am-4pm    Remember:  Do not eat after midnight the night before your surgery.  You may drink clear liquids until 09:15 AM the morning of your surgery.   Clear liquids allowed are: Water, Non-Citrus Juices (without pulp), Carbonated Beverages, Clear Tea, Black Coffee Only, and Gatorade.   Enhanced Recovery after Surgery for Orthopedics Enhanced Recovery after Surgery is a protocol used to improve the stress on your body and your recovery after surgery.  Patient Instructions  . The night before surgery:  o No food after midnight. ONLY clear liquids after midnight  .  Marland Kitchen The day of surgery (if you do NOT have diabetes):  o Drink ONE (1) Pre-Surgery Clear Ensure by 09:15 AM the morning of surgery   o This drink was given to you during your hospital  pre-op appointment visit. o Nothing else to drink after completing the  Pre-Surgery Clear Ensure.         If you have questions, please contact your surgeon's office.     Take these medicines the morning of surgery with A SIP OF WATER : oxyCODONE-acetaminophen (PERCOCET)- if needed  As of today, STOP taking any Aspirin (unless otherwise instructed by your surgeon) Aleve, Naproxen, Ibuprofen, Motrin, Advil, Goody's, BC's, all herbal medications, fish oil, and all vitamins.       The Morning of Surgery:                Do not wear jewelry, make up, or nail polish.            Do not wear lotions, powders, perfumes, or deodorant.            Do not shave 48 hours prior to surgery.              Do not bring valuables to the hospital.            [facility] is not responsible for any belongings or valuables.  Do  NOT Smoke (Tobacco/Vaping) or drink Alcohol 24 hours prior to your procedure.  If you use a CPAP at night, you may bring all equipment for your overnight stay.   Contacts, glasses, dentures or bridgework may not be worn into surgery.      For patients admitted to the hospital, discharge time will be determined by your treatment team.   Patients discharged the day of surgery will not be allowed to drive home, and someone needs to stay with them for 24 hours.    Special instructions:   [facility]- Preparing For Surgery  Before surgery, you can play an important role. Because skin is not sterile, your skin needs to be as free of germs as possible. You can reduce the number of germs on your skin by washing with CHG (chlorahexidine gluconate) Soap before surgery.  CHG is an antiseptic cleaner which kills germs and bonds with the skin to continue killing germs even after washing.    Oral Hygiene is also important to reduce your risk of infection.  Remember - BRUSH YOUR TEETH THE MORNING OF SURGERY WITH YOUR REGULAR TOOTHPASTE  Please do not use if you have an  allergy to CHG or antibacterial soaps. If your skin becomes reddened/irritated stop using the CHG.  Do not shave (including legs and underarms) for at least 48 hours prior to first CHG shower. It is OK to shave your face.  Please follow these instructions carefully.   1. Shower the NIGHT BEFORE SURGERY and the MORNING OF SURGERY with CHG Soap.   2. If you chose to wash your hair, wash your hair first as usual with your normal shampoo.  3. After you shampoo, rinse your hair and body thoroughly to remove the shampoo.  4. Use CHG as you would any other liquid soap. You can apply CHG directly to the skin and wash gently with a scrungie or a clean washcloth.   5. Apply the CHG Soap to your body ONLY FROM THE NECK DOWN.  Do not use on open wounds or open sores. Avoid contact with your eyes, ears, mouth and genitals (private parts). Wash  Face and genitals (private parts)  with your normal soap.   6. Wash thoroughly, paying special attention to the area where your surgery will be performed.  7. Thoroughly rinse your body with warm water from the neck down.  8. DO NOT shower/wash with your normal soap after using and rinsing off the CHG Soap.  9. Pat yourself dry with a CLEAN TOWEL.  10. Wear CLEAN PAJAMAS to bed the night before surgery  11. Place CLEAN SHEETS on your bed the night of your first shower and DO NOT SLEEP WITH PETS.   Day of Surgery: Wear Clean/Comfortable clothing the morning of surgery Do not apply any deodorants/lotions.   Remember to brush your teeth WITH YOUR REGULAR TOOTHPASTE.   Please read over the following fact sheets that you were given.

## 2019-12-08 NOTE — Progress Notes (Signed)
PCP - Fayette Pho, MD Cardiologist - Denies  PPM/ICD - Denies  Chest x-ray - N/A EKG - N/A Stress Test - Denies ECHO - Denies Cardiac Cath - Denies  Sleep Study - Denies  Patient denies being diabetic.  Blood Thinner Instructions: N/A Aspirin Instructions: N/A  ERAS Protcol - Yes PRE-SURGERY Ensure or G2- Ensure ordered.  COVID TEST- 12/08/19   Anesthesia review: No  Patient denies shortness of breath, fever, cough and chest pain at PAT appointment   All instructions explained to the patient, with a verbal understanding of the material. Patient agrees to go over the instructions while at home for a better understanding. Patient also instructed to self quarantine after being tested for COVID-19. The opportunity to ask questions was provided.

## 2019-12-11 ENCOUNTER — Ambulatory Visit (HOSPITAL_COMMUNITY): Payer: Medicaid Other | Admitting: Anesthesiology

## 2019-12-11 ENCOUNTER — Encounter (HOSPITAL_COMMUNITY)
Admission: RE | Disposition: A | Discharge status: Home / Self Care | Payer: Self-pay | Source: Home / Self Care | Attending: Orthopedic Surgery

## 2019-12-11 ENCOUNTER — Other Ambulatory Visit: Payer: Self-pay

## 2019-12-11 ENCOUNTER — Encounter (HOSPITAL_COMMUNITY): Payer: Self-pay | Admitting: Orthopedic Surgery

## 2019-12-11 ENCOUNTER — Ambulatory Visit (HOSPITAL_COMMUNITY)
Admission: RE | Admit: 2019-12-11 | Discharge: 2019-12-11 | Disposition: A | Discharge status: Home / Self Care | Payer: Medicaid Other | Attending: Orthopedic Surgery | Admitting: Orthopedic Surgery

## 2019-12-11 ENCOUNTER — Ambulatory Visit (HOSPITAL_COMMUNITY): Payer: Medicaid Other

## 2019-12-11 DIAGNOSIS — Z87442 Personal history of urinary calculi: Secondary | ICD-10-CM | POA: Insufficient documentation

## 2019-12-11 DIAGNOSIS — F17210 Nicotine dependence, cigarettes, uncomplicated: Secondary | ICD-10-CM | POA: Diagnosis not present

## 2019-12-11 DIAGNOSIS — S8261XA Displaced fracture of lateral malleolus of right fibula, initial encounter for closed fracture: Secondary | ICD-10-CM | POA: Diagnosis not present

## 2019-12-11 DIAGNOSIS — Z419 Encounter for procedure for purposes other than remedying health state, unspecified: Secondary | ICD-10-CM

## 2019-12-11 DIAGNOSIS — Z793 Long term (current) use of hormonal contraceptives: Secondary | ICD-10-CM | POA: Diagnosis not present

## 2019-12-11 DIAGNOSIS — W19XXXA Unspecified fall, initial encounter: Secondary | ICD-10-CM | POA: Diagnosis not present

## 2019-12-11 DIAGNOSIS — S82851A Displaced trimalleolar fracture of right lower leg, initial encounter for closed fracture: Secondary | ICD-10-CM | POA: Insufficient documentation

## 2019-12-11 DIAGNOSIS — R000 Tachycardia, unspecified: Secondary | ICD-10-CM | POA: Diagnosis not present

## 2019-12-11 DIAGNOSIS — N898 Other specified noninflammatory disorders of vagina: Secondary | ICD-10-CM | POA: Diagnosis not present

## 2019-12-11 DIAGNOSIS — S8251XA Displaced fracture of medial malleolus of right tibia, initial encounter for closed fracture: Secondary | ICD-10-CM | POA: Diagnosis not present

## 2019-12-11 DIAGNOSIS — G8918 Other acute postprocedural pain: Secondary | ICD-10-CM | POA: Diagnosis not present

## 2019-12-11 HISTORY — PX: ORIF ANKLE FRACTURE: SHX5408

## 2019-12-11 LAB — POCT PREGNANCY, URINE: Preg Test, Ur: NEGATIVE

## 2019-12-11 SURGERY — OPEN REDUCTION INTERNAL FIXATION (ORIF) ANKLE FRACTURE
Anesthesia: General | Site: Ankle | Laterality: Right

## 2019-12-11 MED ORDER — ONDANSETRON HCL 4 MG/2ML IJ SOLN: 4.0000 mg | Freq: Once | INTRAMUSCULAR | Status: DC | PRN

## 2019-12-11 MED ORDER — ORAL CARE MOUTH RINSE: 15.0000 mL | Freq: Once | OROMUCOSAL | Status: AC

## 2019-12-11 MED ORDER — LACTATED RINGERS IV SOLN: INTRAVENOUS | Status: DC | PRN

## 2019-12-11 MED ORDER — ONDANSETRON HCL 4 MG/2ML IJ SOLN
INTRAMUSCULAR | Status: AC
  Filled 2019-12-11: qty 2

## 2019-12-11 MED ORDER — ACETAMINOPHEN 160 MG/5ML PO SOLN: 325.0000 mg | ORAL | Status: DC | PRN

## 2019-12-11 MED ORDER — FENTANYL CITRATE (PF) 100 MCG/2ML IJ SOLN: 25.0000 ug | INTRAMUSCULAR | Status: DC | PRN

## 2019-12-11 MED ORDER — FENTANYL CITRATE (PF) 250 MCG/5ML IJ SOLN
INTRAMUSCULAR | Status: DC | PRN
  Administered 2019-12-11 (×5): 50 ug via INTRAVENOUS

## 2019-12-11 MED ORDER — ACETAMINOPHEN 325 MG PO TABS: 325.0000 mg | ORAL_TABLET | ORAL | Status: DC | PRN

## 2019-12-11 MED ORDER — OXYCODONE HCL 5 MG PO TABS: 5.0000 mg | ORAL_TABLET | Freq: Once | ORAL | Status: DC | PRN

## 2019-12-11 MED ORDER — BUPIVACAINE LIPOSOME 1.3 % IJ SUSP
INTRAMUSCULAR | Status: DC | PRN
Start: 2019-12-11 — End: 2019-12-11
  Administered 2019-12-11: 10 mL via PERINEURAL

## 2019-12-11 MED ORDER — HYDROMORPHONE HCL 1 MG/ML IJ SOLN
INTRAMUSCULAR | Status: AC
  Filled 2019-12-11: qty 1

## 2019-12-11 MED ORDER — OXYCODONE HCL 5 MG/5ML PO SOLN: 5.0000 mg | Freq: Once | ORAL | Status: DC | PRN

## 2019-12-11 MED ORDER — MEPERIDINE HCL 25 MG/ML IJ SOLN: 6.2500 mg | INTRAMUSCULAR | Status: DC | PRN

## 2019-12-11 MED ORDER — PROPOFOL 10 MG/ML IV BOLUS
INTRAVENOUS | Status: DC | PRN
  Administered 2019-12-11: 160 mg via INTRAVENOUS

## 2019-12-11 MED ORDER — FENTANYL CITRATE (PF) 100 MCG/2ML IJ SOLN
INTRAMUSCULAR | Status: AC
  Filled 2019-12-11: qty 2

## 2019-12-11 MED ORDER — CHLORHEXIDINE GLUCONATE 0.12 % MT SOLN
OROMUCOSAL | Status: AC
  Administered 2019-12-11: 15 mL via OROMUCOSAL
  Filled 2019-12-11: qty 15

## 2019-12-11 MED ORDER — MIDAZOLAM HCL 2 MG/2ML IJ SOLN
INTRAMUSCULAR | Status: AC
  Filled 2019-12-11: qty 2

## 2019-12-11 MED ORDER — MIDAZOLAM HCL 2 MG/2ML IJ SOLN: 2.0000 mg | Freq: Once | INTRAMUSCULAR | Status: AC

## 2019-12-11 MED ORDER — FENTANYL CITRATE (PF) 250 MCG/5ML IJ SOLN
INTRAMUSCULAR | Status: AC
  Filled 2019-12-11: qty 5

## 2019-12-11 MED ORDER — OXYCODONE HCL 5 MG PO TABS
5.0000 mg | ORAL_TABLET | Freq: Once | ORAL | Status: AC | PRN
  Administered 2019-12-11: 5 mg via ORAL

## 2019-12-11 MED ORDER — BUPIVACAINE HCL (PF) 0.25 % IJ SOLN
INTRAMUSCULAR | Status: AC
  Filled 2019-12-11: qty 30

## 2019-12-11 MED ORDER — CEFAZOLIN SODIUM-DEXTROSE 2-4 GM/100ML-% IV SOLN
INTRAVENOUS | Status: AC
  Filled 2019-12-11: qty 100

## 2019-12-11 MED ORDER — LACTATED RINGERS IV SOLN: INTRAVENOUS | Status: DC

## 2019-12-11 MED ORDER — PHENYLEPHRINE 40 MCG/ML (10ML) SYRINGE FOR IV PUSH (FOR BLOOD PRESSURE SUPPORT)
PREFILLED_SYRINGE | INTRAVENOUS | Status: AC
  Filled 2019-12-11: qty 10

## 2019-12-11 MED ORDER — CHLORHEXIDINE GLUCONATE 0.12 % MT SOLN: 15.0000 mL | Freq: Once | OROMUCOSAL | Status: AC

## 2019-12-11 MED ORDER — FENTANYL CITRATE (PF) 100 MCG/2ML IJ SOLN
25.0000 ug | INTRAMUSCULAR | Status: DC | PRN
  Administered 2019-12-11: 50 ug via INTRAVENOUS

## 2019-12-11 MED ORDER — KETOROLAC TROMETHAMINE 30 MG/ML IJ SOLN
INTRAMUSCULAR | Status: DC | PRN
  Administered 2019-12-11: 30 mg via INTRAVENOUS

## 2019-12-11 MED ORDER — ONDANSETRON 4 MG PO TBDP: 4.0000 mg | ORAL_TABLET | Freq: Three times a day (TID) | ORAL | 0 refills | Status: DC | PRN

## 2019-12-11 MED ORDER — HYDROMORPHONE HCL 1 MG/ML IJ SOLN
0.2500 mg | INTRAMUSCULAR | Status: DC | PRN
  Administered 2019-12-11 (×4): 0.5 mg via INTRAVENOUS

## 2019-12-11 MED ORDER — FENTANYL CITRATE (PF) 100 MCG/2ML IJ SOLN
25.0000 ug | INTRAMUSCULAR | Status: DC | PRN
  Administered 2019-12-11 (×2): 50 ug via INTRAVENOUS

## 2019-12-11 MED ORDER — DEXAMETHASONE SODIUM PHOSPHATE 10 MG/ML IJ SOLN
INTRAMUSCULAR | Status: DC | PRN
Start: 2019-12-11 — End: 2019-12-11
  Administered 2019-12-11: 10 mg via INTRAVENOUS

## 2019-12-11 MED ORDER — CEFAZOLIN SODIUM-DEXTROSE 2-4 GM/100ML-% IV SOLN
2.0000 g | INTRAVENOUS | Status: AC
  Administered 2019-12-11: 2 g via INTRAVENOUS

## 2019-12-11 MED ORDER — OXYCODONE HCL 5 MG PO TABS: 5.0000 mg | ORAL_TABLET | Freq: Four times a day (QID) | ORAL | 0 refills | Status: DC | PRN

## 2019-12-11 MED ORDER — PROPOFOL 10 MG/ML IV BOLUS
INTRAVENOUS | Status: AC
  Filled 2019-12-11: qty 20

## 2019-12-11 MED ORDER — MEPERIDINE HCL 25 MG/ML IJ SOLN
6.2500 mg | INTRAMUSCULAR | Status: DC | PRN
  Administered 2019-12-11: 12.5 mg via INTRAVENOUS

## 2019-12-11 MED ORDER — PHENYLEPHRINE 40 MCG/ML (10ML) SYRINGE FOR IV PUSH (FOR BLOOD PRESSURE SUPPORT)
PREFILLED_SYRINGE | INTRAVENOUS | Status: DC | PRN
  Administered 2019-12-11 (×2): 40 ug via INTRAVENOUS

## 2019-12-11 MED ORDER — FENTANYL CITRATE (PF) 100 MCG/2ML IJ SOLN
INTRAMUSCULAR | Status: AC
  Administered 2019-12-11: 100 ug via INTRAVENOUS
  Filled 2019-12-11: qty 2

## 2019-12-11 MED ORDER — ONDANSETRON HCL 4 MG/2ML IJ SOLN
INTRAMUSCULAR | Status: DC | PRN
  Administered 2019-12-11: 4 mg via INTRAVENOUS

## 2019-12-11 MED ORDER — MIDAZOLAM HCL 5 MG/5ML IJ SOLN
INTRAMUSCULAR | Status: DC | PRN
  Administered 2019-12-11: 2 mg via INTRAVENOUS

## 2019-12-11 MED ORDER — MIDAZOLAM HCL 2 MG/2ML IJ SOLN
INTRAMUSCULAR | Status: AC
  Administered 2019-12-11: 2 mg via INTRAVENOUS
  Filled 2019-12-11: qty 2

## 2019-12-11 MED ORDER — OXYCODONE HCL 5 MG PO TABS
ORAL_TABLET | ORAL | Status: AC
  Filled 2019-12-11: qty 1

## 2019-12-11 MED ORDER — LIDOCAINE 2% (20 MG/ML) 5 ML SYRINGE
INTRAMUSCULAR | Status: DC | PRN
  Administered 2019-12-11: 60 mg via INTRAVENOUS

## 2019-12-11 MED ORDER — BUPIVACAINE-EPINEPHRINE (PF) 0.5% -1:200000 IJ SOLN
INTRAMUSCULAR | Status: DC | PRN
  Administered 2019-12-11: 15 mL

## 2019-12-11 MED ORDER — 0.9 % SODIUM CHLORIDE (POUR BTL) OPTIME
TOPICAL | Status: DC | PRN
  Administered 2019-12-11: 1000 mL

## 2019-12-11 MED ORDER — MEPERIDINE HCL 25 MG/ML IJ SOLN
INTRAMUSCULAR | Status: AC
  Filled 2019-12-11: qty 1

## 2019-12-11 MED ORDER — LIDOCAINE 2% (20 MG/ML) 5 ML SYRINGE
INTRAMUSCULAR | Status: AC
  Filled 2019-12-11: qty 5

## 2019-12-11 MED ORDER — DEXAMETHASONE SODIUM PHOSPHATE 10 MG/ML IJ SOLN
INTRAMUSCULAR | Status: AC
  Filled 2019-12-11: qty 1

## 2019-12-11 MED ORDER — KETOROLAC TROMETHAMINE 30 MG/ML IJ SOLN
INTRAMUSCULAR | Status: AC
  Filled 2019-12-11: qty 1

## 2019-12-11 MED ORDER — FENTANYL CITRATE (PF) 100 MCG/2ML IJ SOLN: 100.0000 ug | Freq: Once | INTRAMUSCULAR | Status: AC

## 2019-12-11 SURGICAL SUPPLY — 61 items
ALCOHOL 70% 16 OZ (MISCELLANEOUS) ×2 IMPLANT
BANDAGE ESMARK 6X9 LF (GAUZE/BANDAGES/DRESSINGS) IMPLANT
BINDER ABDOMINAL 12 SM 30-45 (SOFTGOODS) ×2 IMPLANT
BIT DRILL 2 CANN GRADUATED (BIT) ×2 IMPLANT
BIT DRILL 2.5 CANN STRL (BIT) ×2 IMPLANT
BIT DRILL 2.6 CANN (BIT) ×2 IMPLANT
BNDG COHESIVE 4X5 TAN STRL (GAUZE/BANDAGES/DRESSINGS) ×2 IMPLANT
BNDG ELASTIC 4X5.8 VLCR STR LF (GAUZE/BANDAGES/DRESSINGS) ×2 IMPLANT
BNDG ELASTIC 6X5.8 VLCR STR LF (GAUZE/BANDAGES/DRESSINGS) IMPLANT
BNDG ESMARK 6X9 LF (GAUZE/BANDAGES/DRESSINGS)
CANISTER SUCT 3000ML PPV (MISCELLANEOUS) ×2 IMPLANT
COVER SURGICAL LIGHT HANDLE (MISCELLANEOUS) ×2 IMPLANT
COVER WAND RF STERILE (DRAPES) ×2 IMPLANT
CUFF TOURN SGL QUICK 34 (TOURNIQUET CUFF)
CUFF TRNQT CYL 34X4.125X (TOURNIQUET CUFF) IMPLANT
DRAPE C-ARM 42X72 X-RAY (DRAPES) ×2 IMPLANT
DRAPE C-ARMOR (DRAPES) ×2 IMPLANT
DRAPE U-SHAPE 47X51 STRL (DRAPES) ×2 IMPLANT
DRSG ADAPTIC 3X8 NADH LF (GAUZE/BANDAGES/DRESSINGS) ×2 IMPLANT
DRSG PAD ABDOMINAL 8X10 ST (GAUZE/BANDAGES/DRESSINGS) ×2 IMPLANT
DURAPREP 26ML APPLICATOR (WOUND CARE) ×2 IMPLANT
ELECT REM PT RETURN 9FT ADLT (ELECTROSURGICAL) ×2
ELECTRODE REM PT RTRN 9FT ADLT (ELECTROSURGICAL) ×1 IMPLANT
GAUZE SPONGE 4X4 12PLY STRL (GAUZE/BANDAGES/DRESSINGS) ×2 IMPLANT
GLOVE BIO SURGEON STRL SZ7.5 (GLOVE) ×2 IMPLANT
GLOVE BIOGEL PI IND STRL 8 (GLOVE) ×1 IMPLANT
GLOVE BIOGEL PI INDICATOR 8 (GLOVE) ×1
GOWN STRL REUS W/ TWL LRG LVL3 (GOWN DISPOSABLE) ×2 IMPLANT
GOWN STRL REUS W/ TWL XL LVL3 (GOWN DISPOSABLE) ×1 IMPLANT
GOWN STRL REUS W/TWL LRG LVL3 (GOWN DISPOSABLE) ×2
GOWN STRL REUS W/TWL XL LVL3 (GOWN DISPOSABLE) ×1
GUIDEWIRE 1.35MM (WIRE) ×4 IMPLANT
KIT BASIN OR (CUSTOM PROCEDURE TRAY) ×2 IMPLANT
KIT TURNOVER KIT B (KITS) ×2 IMPLANT
NEEDLE HYPO 25GX1X1/2 BEV (NEEDLE) IMPLANT
NS IRRIG 1000ML POUR BTL (IV SOLUTION) ×2 IMPLANT
PACK ORTHO EXTREMITY (CUSTOM PROCEDURE TRAY) ×2 IMPLANT
PAD ABD 8X10 STRL (GAUZE/BANDAGES/DRESSINGS) ×2 IMPLANT
PAD ARMBOARD 7.5X6 YLW CONV (MISCELLANEOUS) ×4 IMPLANT
PAD CAST 4YDX4 CTTN HI CHSV (CAST SUPPLIES) ×1 IMPLANT
PADDING CAST COTTON 4X4 STRL (CAST SUPPLIES) ×1
PADDING CAST COTTON 6X4 STRL (CAST SUPPLIES) ×2 IMPLANT
PLATE 5HOLE LOCKING 91MML (Plate) ×2 IMPLANT
SCREW CANCELLOUS 3MM 3X14MM (Screw) ×4 IMPLANT
SCREW CANCELLOUS 3X16MM (Screw) ×2 IMPLANT
SCREW LOW PROFILE 4.0X40 (Screw) ×4 IMPLANT
SCREW NON-LOCKING 3.5X12MM (Screw) ×6 IMPLANT
SPLINT FIBERGLASS 4X30 (CAST SUPPLIES) ×2 IMPLANT
SPONGE LAP 18X18 RF (DISPOSABLE) IMPLANT
STAPLER VISISTAT 35W (STAPLE) ×2 IMPLANT
SUCTION FRAZIER HANDLE 10FR (MISCELLANEOUS) ×1
SUCTION TUBE FRAZIER 10FR DISP (MISCELLANEOUS) ×1 IMPLANT
SUT ETHILON 3 0 PS 1 (SUTURE) ×2 IMPLANT
SUT VIC AB 0 CT1 27 (SUTURE)
SUT VIC AB 0 CT1 27XBRD ANBCTR (SUTURE) IMPLANT
SUT VIC AB 2-0 CT1 27 (SUTURE) ×2
SUT VIC AB 2-0 CT1 TAPERPNT 27 (SUTURE) ×2 IMPLANT
SYR CONTROL 10ML LL (SYRINGE) IMPLANT
TOWEL GREEN STERILE (TOWEL DISPOSABLE) ×4 IMPLANT
TUBE CONNECTING 12X1/4 (SUCTIONS) ×2 IMPLANT
YANKAUER SUCT BULB TIP NO VENT (SUCTIONS) ×2 IMPLANT

## 2019-12-11 NOTE — Anesthesia Procedure Notes (Signed)
Anesthesia Regional Block: Popliteal block   Pre-Anesthetic Checklist: ,, timeout performed, Correct Patient, Correct Site, Correct Laterality, Correct Procedure, Correct Position, site marked, Risks and benefits discussed,  Surgical consent,  Pre-op evaluation,  At surgeon's request and post-op pain management  Laterality: Right  Prep: chloraprep       Needles:  Injection technique: Single-shot  Needle Type: Echogenic Stimulator Needle     Needle Length: 5cm  Needle Gauge: 22     Additional Needles:   Procedures:, nerve stimulator,,, ultrasound used (permanent image in chart),,,,   Nerve Stimulator or Paresthesia:  Response: foot, 0.45 mA,   Additional Responses:   Narrative:  Start time: 12/11/2019 12:00 PM End time: 12/11/2019 12:05 PM Injection made incrementally with aspirations every 5 mL.  Performed by: Personally  Anesthesiologist: Bethena Midget, MD  Additional Notes: Functioning IV was confirmed and monitors were applied.  A 63mm 22ga Arrow echogenic stimulator needle was used. Sterile prep and drape,hand hygiene and sterile gloves were used. Ultrasound guidance: relevant anatomy identified, needle position confirmed, local anesthetic spread visualized around nerve(s)., vascular puncture avoided.  Image printed for medical record. Negative aspiration and negative test dose prior to incremental administration of local anesthetic. The patient tolerated the procedure well.

## 2019-12-11 NOTE — Anesthesia Preprocedure Evaluation (Addendum)
Anesthesia Evaluation  Patient identified by MRN, date of birth, ID band Patient awake    Reviewed: Allergy & Precautions, H&P , Patient's Chart, lab work & pertinent test results  Airway Mallampati: II  TM Distance: >3 FB Neck ROM: full    Dental  (+) Teeth Intact   Pulmonary Current Smoker, former smoker,    breath sounds clear to auscultation       Cardiovascular  Rhythm:regular Rate:Normal     Neuro/Psych    GI/Hepatic   Endo/Other    Renal/GU      Musculoskeletal   Abdominal   Peds  Hematology   Anesthesia Other Findings       Reproductive/Obstetrics                            Anesthesia Physical  Anesthesia Plan  ASA: II  Anesthesia Plan: General   Post-op Pain Management: GA combined w/ Regional for post-op pain   Induction: Intravenous and Inhalational  PONV Risk Score and Plan:   Airway Management Planned: Oral ETT and LMA  Additional Equipment:   Intra-op Plan:   Post-operative Plan: Extubation in OR  Informed Consent: I have reviewed the patients History and Physical, chart, labs and discussed the procedure including the risks, benefits and alternatives for the proposed anesthesia with the patient or authorized representative who has indicated his/her understanding and acceptance.     Dental Advisory Given  Plan Discussed with: Anesthesiologist  Anesthesia Plan Comments:         Anesthesia Quick Evaluation

## 2019-12-11 NOTE — Brief Op Note (Signed)
12/11/2019  1:51 PM  PATIENT:  Shannon Randolph  29 y.o. female  PRE-OPERATIVE DIAGNOSIS:  Right ankle trimalleolar fracture  POST-OPERATIVE DIAGNOSIS:  Right ankle trimalleolar fracture  PROCEDURE:  Procedure(s) with comments: OPEN REDUCTION INTERNAL FIXATION (ORIF) ANKLE FRACTURE (Right) - 100 mins  SURGEON:  Surgeon(s) and Role:    * Yolonda Kida, MD - Primary  PHYSICIAN ASSISTANT:   ASSISTANTS: April Green, RNFA   ANESTHESIA:   regional and general  EBL:  30 mL   BLOOD ADMINISTERED:none  DRAINS: none   LOCAL MEDICATIONS USED:  NONE  SPECIMEN:  No Specimen  DISPOSITION OF SPECIMEN:  N/A  COUNTS:  YES  TOURNIQUET:  Calf tourniquet for 50 minutes  DICTATION: .Note written in EPIC  PLAN OF CARE: Discharge to home after PACU  PATIENT DISPOSITION:  PACU - hemodynamically stable.   Delay start of Pharmacological VTE agent (>24hrs) due to surgical blood loss or risk of bleeding: not applicable

## 2019-12-11 NOTE — Op Note (Signed)
Date of Surgery: 12/11/2019  INDICATIONS: Shannon Randolph is a 29 y.o.-year-old female who sustained a right ankle fracture; she was indicated for open reduction and internal fixation due to the displaced nature of the articular fracture and came to the operating room today for this procedure. The patient did consent to the procedure after discussion of the risks and benefits.  PREOPERATIVE DIAGNOSIS: right trimalleolar ankle fracture  POSTOPERATIVE DIAGNOSIS: Same.  PROCEDURE: Open treatment of right ankle fracture with internal fixation Trimalleolar w/o fixation of posterior malleolus CPT 27822.   SURGEON: Shannon Randolph, M.D.  ASSIST: Shannon Randolph, RNFA.  ANESTHESIA:  general, with regional  TOURNIQUET TIME: 50 min. At the calf  IV FLUIDS AND URINE: See anesthesia.  ESTIMATED BLOOD LOSS: 30 mL.  IMPLANTS:  Arthrex distal fibula plate, 5 hole.  With 3 oh cancellous screws distal x3, and 3 proximal 3.5 mm cortical screws.  Medially 2, 4.0 mm partially threaded cannulated screws  COMPLICATIONS: None.  DESCRIPTION OF PROCEDURE: The patient was brought to the operating room and placed supine on the operating table.  The patient had been signed prior to the procedure and this was documented. The patient had the anesthesia placed by the anesthesiologist.  A nonsterile tourniquet was placed on the upper thigh.  The prep verification and incision time-outs were performed to confirm that this was the correct patient, site, side and location. The patient had an SCD on the opposite lower extremity. The patient did receive antibiotics prior to the incision and was re-dosed during the procedure as needed at indicated intervals.  The patient had the lower extremity prepped and draped in the standard surgical fashion.  The extremity was exsanguinated using an esmarch bandage and the Esmarch was wrapped around the proximal calf as a on the field tourniquet.  Incision was made over the distal fibula  and the fracture was exposed and reduced anatomically with a clamp.  There was comminution lateral and anterior which made it difficult to utilize lag fixation of the primary posterior spike.  Therefore we did reduce this with a clamp.  And then we neutralized this with a distal fibula plate.  We first placed one 3.0 mm cancellous screw distally and then one 3.5 mm cortical screw proximally.  We checked on intraoperative fluoroscopy that the length of the fibula was appropriate.  We then applied 2 more distal 3.  0 mm cancellous screws into the distal cluster, and 2 more proximal 3.5 millimeter screws.  These had good purchase.  We then assessed the posterior malleolus with intraoperative fluoroscopy.  This was noted to be extra-articular in nature and had traveled into an anatomic position once the fibula was out to length.  We opted for nonoperative management therefore.  I then turned my attention to the medial malleolus. Incision was made over the medial malleolus and the fracture exposed and held provisionally with a clamp. 2 guidepins were placed for the 4.0 mm cannulated screws and then confirmation of reduction was made with fluoroscopy. I then placed 2  94mm screws which had satisfactory fixation.   The syndesmosis was stressed using live fluoroscopy and found to be stable.   The wounds were irrigated, and closed with vicryl with routine closure for the skin. The wounds were injected with local anesthetic. Sterile gauze was applied followed by a posterior splint. She was awakened and returned to the PACU in stable and satisfactory condition. There were no complications.    POSTOPERATIVE PLAN: Shannon Randolph will remain nonweightbearing  on this leg for approximately 6 weeks; Shannon Randolph will return for suture removal in 2 weeks.  He will be immobilized in a short leg splint and then transitioned to a CAM walker at his first follow up appointment.  Shannon Randolph will receive DVT prophylaxis based  on other medications, activity level, and risk ratio of bleeding to thrombosis.  Shannon Rued, MD [facility] [place] [phone] 1:56 PM

## 2019-12-11 NOTE — Anesthesia Procedure Notes (Signed)
Procedure Name: Intubation Performed by: Shary Decamp, CRNA Pre-anesthesia Checklist: Patient identified, Emergency Drugs available, Suction available and Patient being monitored Patient Re-evaluated:Patient Re-evaluated prior to induction Oxygen Delivery Method: Circle system utilized Preoxygenation: Pre-oxygenation with 100% oxygen Induction Type: IV induction Ventilation: Mask ventilation without difficulty LMA Size: 4.0 Tube type: Oral Number of attempts: 1 Placement Confirmation: positive ETCO2 and breath sounds checked- equal and bilateral Tube secured with: Tape Dental Injury: Teeth and Oropharynx as per pre-operative assessment

## 2019-12-11 NOTE — Anesthesia Postprocedure Evaluation (Signed)
Anesthesia Post Note  Patient: Shannon Randolph  Procedure(s) Performed: OPEN REDUCTION INTERNAL FIXATION (ORIF) ANKLE FRACTURE (Right Ankle)     Patient location during evaluation: PACU Anesthesia Type: General Level of consciousness: awake and alert Pain management: pain level controlled Vital Signs Assessment: post-procedure vital signs reviewed and stable Respiratory status: spontaneous breathing, nonlabored ventilation, respiratory function stable and patient connected to nasal cannula oxygen Cardiovascular status: blood pressure returned to baseline and stable Postop Assessment: no apparent nausea or vomiting Anesthetic complications: no   No complications documented.  Last Vitals:  Vitals:   12/11/19 1210 12/11/19 1400  BP: 108/65 133/83  Pulse: 73 88  Resp: 12 16  Temp:  36.8 C  SpO2: 100% 100%    Last Pain:  Vitals:   12/11/19 1410  TempSrc:   PainSc: 9                  Theia Dezeeuw

## 2019-12-11 NOTE — Transfer of Care (Signed)
Immediate Anesthesia Transfer of Care Note  Patient: Shannon Randolph  Procedure(s) Performed: OPEN REDUCTION INTERNAL FIXATION (ORIF) ANKLE FRACTURE (Right Ankle)  Patient Location: PACU  Anesthesia Type:General  Level of Consciousness: drowsy and patient cooperative  Airway & Oxygen Therapy: Patient Spontanous Breathing and Patient connected to face mask oxygen  Post-op Assessment: Report given to RN and Post -op Vital signs reviewed and stable  Post vital signs: Reviewed and stable  Last Vitals:  Vitals Value Taken Time  BP 133/83 12/11/19 1359  Temp    Pulse 88 12/11/19 1359  Resp 25 12/11/19 1359  SpO2 100 % 12/11/19 1359  Vitals shown include unvalidated device data.  Last Pain:  Vitals:   12/11/19 1044  TempSrc:   PainSc: 8       Patients Stated Pain Goal: 2 (12/11/19 1044)  Complications: No complications documented.

## 2019-12-11 NOTE — H&P (Signed)
ORTHOPAEDIC H and P  REQUESTING PHYSICIAN: Yolonda Kida, MD  PCP:  Fayette Pho, MD  Chief Complaint: Right ankle fracture  HPI: Shannon Randolph is a 29 y.o. female who complains of right ankle pain following a fall about 10 days ago.  This was off of a set of platform type shoes.  She sustained a trimalleolar ankle fracture.  She is here today for definitive treatment with internal fixation.  Her pain has improved.  Swelling has improved.  She has been nonweightbearing.  No new complaints today.  We have previously discussed the recommendation for surgical management in the office.  Past Medical History:  Diagnosis Date  . Abortion   . Bacterial vaginosis   . History of kidney stones   . Infection    UTI, gonorrhea  . No pertinent past medical history    Past Surgical History:  Procedure Laterality Date  . NO PAST SURGERIES     Social History   Socioeconomic History  . Marital status: Single    Spouse name: Not on file  . Number of children: Not on file  . Years of education: Not on file  . Highest education level: Not on file  Occupational History  . Not on file  Tobacco Use  . Smoking status: Current Every Day Smoker    Packs/day: 0.25    Types: Cigarettes  . Smokeless tobacco: Former Neurosurgeon    Quit date: 01/11/2012  Vaping Use  . Vaping Use: Every day  . Substances: THC  Substance and Sexual Activity  . Alcohol use: No  . Drug use: No  . Sexual activity: Yes    Birth control/protection: None  Other Topics Concern  . Not on file  Social History Narrative  . Not on file   Social Determinants of Health   Financial Resource Strain:   . Difficulty of Paying Living Expenses:   Food Insecurity:   . Worried About Programme researcher, broadcasting/film/video in the Last Year:   . Barista in the Last Year:   Transportation Needs:   . Freight forwarder (Medical):   Marland Kitchen Lack of Transportation (Non-Medical):   Physical Activity:   . Days of Exercise per Week:    . Minutes of Exercise per Session:   Stress:   . Feeling of Stress :   Social Connections:   . Frequency of Communication with Friends and Family:   . Frequency of Social Gatherings with Friends and Family:   . Attends Religious Services:   . Active Member of Clubs or Organizations:   . Attends Banker Meetings:   Marland Kitchen Marital Status:    Family History  Problem Relation Age of Onset  . Hearing loss Neg Hx    No Known Allergies Prior to Admission medications   Medication Sig Start Date End Date Taking? Authorizing Provider  ibuprofen (ADVIL) 800 MG tablet Take 1 tablet (800 mg total) by mouth 3 (three) times daily. 11/27/19  Yes Garlon Hatchet, PA-C  oxyCODONE-acetaminophen (PERCOCET) 5-325 MG tablet Take 1 tablet by mouth every 4 (four) hours as needed. Patient taking differently: Take 1 tablet by mouth every 4 (four) hours as needed for moderate pain.  11/27/19  Yes Garlon Hatchet, PA-C  levonorgestrel-ethinyl estradiol (SEASONALE) 0.15-0.03 MG tablet Take 1 tablet by mouth daily. Patient not taking: Reported on 12/01/2019 09/29/18   Lennox Solders, MD   No results found.  Positive ROS: All other systems have been reviewed and  were otherwise negative with the exception of those mentioned in the HPI and as above.  Physical Exam: General: Alert, no acute distress Cardiovascular: No pedal edema Respiratory: No cyanosis, no use of accessory musculature GI: No organomegaly, abdomen is soft and non-tender Skin: No lesions in the area of chief complaint Neurologic: Sensation intact distally Psychiatric: Patient is competent for consent with normal mood and affect Lymphatic: No axillary or cervical lymphadenopathy  MUSCULOSKELETAL: Right lower extremity:  Positive wrinkle sign.  Skin is warm and well-perfused with capillary refill less than 2 seconds.  She is able to wiggle toes.  Assessment: Right ankle closed trimalleolar fracture  Plan: Plan is to proceed today with  open reduction internal fixation of this trimalleolar fracture.  This unstable fracture that warrants operative management.  We have previously reviewed this and discussed the recommendation for surgical management in the office.  Questions were solicited and answered to her satisfaction.  The risks, benefits, and alternatives were discussed with the patient. There are risks associated with the surgery including, but not limited to, problems with anesthesia (death), infection, differences in leg length/angulation/rotation, fracture of bones, loosening or failure of implants, malunion, nonunion, hematoma (blood accumulation) which may require surgical drainage, blood clots, pulmonary embolism, nerve injury (foot drop), and blood vessel injury. The patient understands these risks and elects to proceed.  -Our plan will be to discharge home from PACU postoperatively.  She will be nonweightbearing for approximately 6 weeks.  I will see her back in the office 2 weeks postoperatively.    Yolonda Kida, MD Cell [phone]    12/11/2019 12:24 PM

## 2019-12-11 NOTE — Discharge Instructions (Signed)
-  No weightbearing to the right lower extremity.  You should maintain your splint clean, and dry at all times as well.  -Maintain strict elevation with "toes above nose."  As much as you are able.  -For mild to moderate pain use Tylenol and Advil around-the-clock.  You should alternate these every 3 hours.  For breakthrough pain use oxycodone 5 to 10 mg every 6 hours as needed.  -For the prevention of blood clots take a 81 mg aspirin twice per day for 6 weeks.  -Return to see Dr. Aundria Rud in 2 weeks for routine postop check.

## 2019-12-14 ENCOUNTER — Encounter (HOSPITAL_COMMUNITY): Payer: Self-pay | Admitting: Orthopedic Surgery

## 2019-12-25 DIAGNOSIS — M25571 Pain in right ankle and joints of right foot: Secondary | ICD-10-CM | POA: Diagnosis not present

## 2020-01-25 DIAGNOSIS — Z4789 Encounter for other orthopedic aftercare: Secondary | ICD-10-CM | POA: Diagnosis not present

## 2020-01-26 ENCOUNTER — Other Ambulatory Visit: Payer: Self-pay

## 2020-01-26 ENCOUNTER — Ambulatory Visit: Payer: Medicaid Other | Attending: Orthopedic Surgery

## 2020-01-26 DIAGNOSIS — M25671 Stiffness of right ankle, not elsewhere classified: Secondary | ICD-10-CM | POA: Diagnosis not present

## 2020-01-26 DIAGNOSIS — R2689 Other abnormalities of gait and mobility: Secondary | ICD-10-CM | POA: Insufficient documentation

## 2020-01-26 DIAGNOSIS — G8929 Other chronic pain: Secondary | ICD-10-CM | POA: Insufficient documentation

## 2020-01-26 DIAGNOSIS — M6281 Muscle weakness (generalized): Secondary | ICD-10-CM | POA: Insufficient documentation

## 2020-01-26 DIAGNOSIS — R600 Localized edema: Secondary | ICD-10-CM | POA: Diagnosis not present

## 2020-01-26 DIAGNOSIS — R262 Difficulty in walking, not elsewhere classified: Secondary | ICD-10-CM | POA: Insufficient documentation

## 2020-01-26 DIAGNOSIS — M25571 Pain in right ankle and joints of right foot: Secondary | ICD-10-CM | POA: Diagnosis not present

## 2020-01-27 NOTE — Therapy (Signed)
[facility] [address] [place], [place], [address] Phone: [phone]   Fax:  [phone]  Physical Therapy Evaluation  Patient Details  Name: Shannon Randolph MRN: [id number] Date of Birth: 12-Jun-1990 Referring Provider (PT): Yolonda Kidaogers, Jason Patrick, MD   Encounter Date: 01/26/2020   PT End of Session - 01/27/20 1346    Visit Number 1    Number of Visits 4    Date for PT Re-Evaluation 02/12/20    Authorization Type [place] MEDICAID HEALTHY BLUE    Authorization - Visit Number 0    Authorization - Number of Visits 3    PT Start Time 1615    PT Stop Time 1700    PT Time Calculation (min) 45 min    Activity Tolerance Patient tolerated treatment well    Behavior [facility] for tasks assessed/performed           Past Medical History:  Diagnosis Date  . Abortion   . Bacterial vaginosis   . History of kidney stones   . Infection    UTI, gonorrhea  . No pertinent past medical history     Past Surgical History:  Procedure Laterality Date  . NO PAST SURGERIES    . ORIF ANKLE FRACTURE Right 12/11/2019   Procedure: OPEN REDUCTION INTERNAL FIXATION (ORIF) ANKLE FRACTURE;  Surgeon: Yolonda Kidaogers, Jason Patrick, MD;  [facility];  Service: Orthopedics;  Laterality: Right;  100 mins    There were no vitals filed for this visit.    Subjective Assessment - 01/27/20 1413    Subjective Pt reports she Fx her r ankle 9 weeks ago when she tripped.    Limitations Standing;Walking    How long can you sit comfortably? Not an issue    How long can you stand comfortably? 15 mins    How long can you walk comfortably? 15 mins    Diagnostic tests Intact hardware, residual fxof the fibula    Patient Stated Goals To walk normal again    Currently in Pain? Yes    Pain Score 4    8/10 at night   Pain Location Ankle    Pain Orientation Right    Pain Descriptors / Indicators Aching    Pain Type Chronic pain    Pain Radiating Towards NA     Pain Onset More than a month ago    Pain Frequency Constant    Aggravating Factors  Time up on feet; at night    Pain Relieving Factors Rest, RICE    Effect of Pain on Daily Activities Moderate impact              [facility] PT Assessment - 01/27/20 0001      Assessment   Medical Diagnosis ORIF R ankle fx    Referring Provider (PT) Yolonda Kidaogers, Jason Patrick, MD    Onset Date/Surgical Date --   6 weeks post surgery; 9 weeks post fx    Next MD Visit 6 weeks    Prior Therapy No      Precautions   Precautions None    Required Braces or Orthoses Other Brace/Splint   Ankle for 6 more weeks when not in PT.     Restrictions   Weight Bearing Restrictions No   progress to full WBAT     Balance Screen   Has the patient fallen in the past 6 months Yes    How many times? 1    Has the patient had a decrease in  activity level because of a fear of falling?  Yes    Is the patient reluctant to leave their home because of a fear of falling?  No      Home [employer] information centre manager residence    Living Arrangements Spouse/significant other;Children    Type of Home House    Home Access Stairs to enter    Entrance Stairs-Number of Steps 3    Entrance Stairs-Rails Right    Home Layout One level      Prior Function   Level of Independence Independent    Vocation Unemployed      Observation/Other Assessments   Observations Healed incisions medially/laterally    Focus on Therapeutic Outcomes (FOTO)  NA      Observation/Other Assessments-Edema    Edema Circumferential   Mid heel and ankle 33.1 cm     Sensation   Light Touch Appears Intact      ROM / Strength   AROM / PROM / Strength AROM;PROM;Strength      AROM   AROM Assessment Site Ankle    Right/Left Ankle Right;Left    Right Ankle Dorsiflexion -13    Right Ankle Plantar Flexion 22    Right Ankle Inversion 5    Right Ankle Eversion 6    Left Ankle Dorsiflexion 8    Left Ankle Plantar Flexion 46    Left Ankle  Inversion 37    Left Ankle Eversion 36      PROM   PROM Assessment Site Ankle    Right/Left Ankle Right;Left    Right Ankle Dorsiflexion -10    Right Ankle Plantar Flexion 24      Strength   Overall Strength Comments R ankle 2-3/5 c MMT within available ROM      Palpation   Palpation comment TTP medial and lateral R ankle      Transfers   Transfers Sit to Stand;Stand to Sit   slow/decreased WB R LE     Ambulation/Gait   Assistive device Crutches    Gait Pattern Step-through pattern;Antalgic   slow pace                     Objective measurements completed on examination: See above findings.               PT Education - 01/27/20 1345    Education Details Eval findings, POC, HEP, RICE for pain and swelling management    Person(s) Educated Patient    Methods Explanation;Demonstration;Tactile cues;Verbal cues;Handout    Comprehension Verbalized understanding;Returned demonstration;Verbal cues required;Tactile cues required;Need further instruction            PT Short Term Goals - 01/27/20 1430      PT SHORT TERM GOAL #1   Title Pt will be Ind in an initial HEP    Baseline Started on eval    Time 3    Period Weeks    Status New    Target Date 02/17/20      PT SHORT TERM GOAL #2   Title Pt will voice understanding of RICE for pain and swelling management    Time 3    Period Weeks    Status New    Target Date 02/17/20      PT SHORT TERM GOAL #3   Title Assess TUG and 2 min walking test and set LTGs    Time 4    Period Weeks    Status New  Target Date 02/24/20             PT Long Term Goals - 01/27/20 1433      PT LONG TERM GOAL #1   Title Pt will report improvement in R ankle pain with daily activities to 4/10 or less    Baseline 5-8/10    Time 7    Period Weeks    Status New    Target Date 03/18/20      PT LONG TERM GOAL #2   Title Pt will walk 1000 ft s an AD demonstrating a heel/toe gait pattern.    Baseline In walking  boot c crutches    Time 7    Period Weeks    Status New    Target Date 03/18/20      PT LONG TERM GOAL #3   Title Pt will be ind in a final HEP    Time 7    Period Weeks    Status New    Target Date 03/18/20                  Plan - 01/27/20 1421    Clinical Impression Statement pt presents to PT to strat rehab post ORIF of the R ankle. Pt is waking c crutches and a walking boot, WBAT. Pt is able to progress from crutches as tolerated, but is to use the walking boot for 6 more weeks outside of PT. MD ghas release pt to work on strengthening and ROMar tolerated with no restrictions. Pt's AROM of the r ankle is marked limited and strengthen is moderately so. HEP    Personal Factors and Comorbidities Fitness    Examination-Activity Limitations Stairs;Stand;Lift;Bend;Locomotion Level    Stability/Clinical Decision Making Stable/Uncomplicated    Clinical Decision Making Low    Rehab Potential Good    PT Frequency 2x / week    PT Duration 6 weeks    PT Treatment/Interventions ADLs/Self Care Home Management;Cryotherapy;Electrical Stimulation;Moist Heat;Iontophoresis 4mg /ml Dexamethasone;Gait training;Functional mobility training;Stair training;Therapeutic activities;Therapeutic exercise;Balance training;Manual techniques;Patient/family education;Passive range of motion;Dry needling;Splinting;Taping;Vasopneumatic Device    PT Next Visit Plan Assess response to HEP and progress as indicated. Assess TUG and 2 min walking test when pt is off crutches and set LTGs    PT Home Exercise Plan [id number]    Consulted and Agree with Plan of Care Patient           Patient will benefit from skilled therapeutic intervention in order to improve the following deficits and impairments:  Abnormal gait, Difficulty walking, Decreased range of motion, Decreased activity tolerance, Decreased balance, Decreased mobility, Decreased strength, Impaired flexibility, Pain  Visit Diagnosis: Localized edema -  Plan: PT plan of care cert/re-cert  Difficulty in walking, not elsewhere classified - Plan: PT plan of care cert/re-cert  Other abnormalities of gait and mobility - Plan: PT plan of care cert/re-cert  Muscle weakness (generalized) - Plan: PT plan of care cert/re-cert  Chronic pain of right ankle - Plan: PT plan of care cert/re-cert  Decreased range of motion of right ankle - Plan: PT plan of care cert/re-cert     Problem List Patient Active Problem List   Diagnosis Date Noted  . Screening for human immunodeficiency virus 01/21/2019  . Contraceptive management 01/21/2019  . Abnormal urine odor 01/21/2019  . Elevated transaminase level 01/21/2019  . Encounter for counseling regarding contraception 09/30/2018  . Screening for malignant neoplasm of cervix 09/30/2018  . Possible exposure to STD 09/30/2018  . Allergic reaction 08/09/2017  .  Abnormal Pap smear of cervix 07/04/2016  . Tachycardia   . LBP (low back pain) 10/18/2011  . Bacterial vaginosis 10/16/2011  . Vaginal discharge 12/11/2010  . HIP PAIN, LEFT 02/22/2010  . TOBACCO ABUSE 04/07/2009    Joellyn Rued MS, PT 01/27/20 2:39 PM  [facility] [address] [place], [place], [address] Phone: [phone]   Fax:  [phone]  Name: Shannon Randolph MRN: [id number] Date of Birth: 30-Oct-1990   Check all possible CPT codes:      [x]  97110 (Therapeutic Exercise)  []  92507 (SLP Treatment)  [x]  [phone] (Neuro Re-ed)   []  92526 (Swallowing Treatment)   [x]  97116 (Gait Training)   []  [phone] (Cognitive Training, 1st 15 minutes) [x]  97140 (Manual Therapy)   []  97130 (Cognitive Training, each add'l 15 minutes)  [x]  97530 (Therapeutic Activities)  []  Other, List CPT Code ____________    [x]  97535 (Self Care)       []  All codes above (97110 - 97535)  [x]  97012 (Mechanical Traction)  [x]  97014 (E-stim Unattended)  [x]  97032 (E-stim manual)  [x]  97033 (Ionto)  [x]  97035  (Ultrasound)  [x]  97016 (Vaso)  []  97760 (Orthotic Fit) []  (Prosthetic Training) []  00370 (Physical Performance Training) []  (Aquatic Therapy) []  (Canalith Repositioning) []  (Contrast Bath) []  48889 (Paraffin) []  97597 (Wound Care 1st 20 sq cm) []  97598 (Wound Care each add'l 20 sq cm)

## 2020-02-02 ENCOUNTER — Other Ambulatory Visit: Payer: Self-pay

## 2020-02-02 ENCOUNTER — Ambulatory Visit: Payer: Medicaid Other | Attending: Orthopedic Surgery | Admitting: Physical Therapy

## 2020-02-02 DIAGNOSIS — G8929 Other chronic pain: Secondary | ICD-10-CM | POA: Insufficient documentation

## 2020-02-02 DIAGNOSIS — R262 Difficulty in walking, not elsewhere classified: Secondary | ICD-10-CM | POA: Diagnosis present

## 2020-02-02 DIAGNOSIS — M6281 Muscle weakness (generalized): Secondary | ICD-10-CM

## 2020-02-02 DIAGNOSIS — M25671 Stiffness of right ankle, not elsewhere classified: Secondary | ICD-10-CM | POA: Diagnosis present

## 2020-02-02 DIAGNOSIS — R600 Localized edema: Secondary | ICD-10-CM

## 2020-02-02 DIAGNOSIS — R2689 Other abnormalities of gait and mobility: Secondary | ICD-10-CM | POA: Insufficient documentation

## 2020-02-02 DIAGNOSIS — M25571 Pain in right ankle and joints of right foot: Secondary | ICD-10-CM | POA: Diagnosis present

## 2020-02-02 NOTE — Therapy (Signed)
[facility] [address] [place], [place], [address] Phone: [phone]   Fax:  [phone]  Physical Therapy Treatment  Patient Details  Name: Shannon Randolph MRN: [id number] Date of Birth: [birth year] Referring Provider (PT): Yolonda Kida, MD   Encounter Date: 02/02/2020   PT End of Session - 02/02/20 1626    Visit Number 2    Number of Visits 4    Date for PT Re-Evaluation 02/12/20    Authorization Type [place] MEDICAID HEALTHY BLUE    PT Start Time 1627    PT Stop Time 1713    PT Time Calculation (min) 46 min    Activity Tolerance Patient tolerated treatment well           Past Medical History:  Diagnosis Date  . Abortion   . Bacterial vaginosis   . History of kidney stones   . Infection    UTI, gonorrhea  . No pertinent past medical history     Past Surgical History:  Procedure Laterality Date  . NO PAST SURGERIES    . ORIF ANKLE FRACTURE Right 12/11/2019   Procedure: OPEN REDUCTION INTERNAL FIXATION (ORIF) ANKLE FRACTURE;  Surgeon: Yolonda Kida, MD;  Location[facility] OR;  Service: Orthopedics;  Laterality: Right;  100 mins    There were no vitals filed for this visit.   Subjective Assessment - 02/02/20 1627    Subjective "I am doing pretty good since the last session. I've doing the exercises most days."    Currently in Pain? Yes    Pain Score 0-No pain    Pain Location Ankle    Pain Orientation Right    Pain Type Chronic pain    Pain Onset More than a month ago              [facility] PT Assessment - 02/02/20 0001      Assessment   Medical Diagnosis ORIF R ankle fx    Referring Provider (PT) Yolonda Kida, MD      Standardized Balance Assessment   Standardized Balance Assessment Five Times Sit to Stand    Five times sit to stand comments  10   weight shifted to the L throughout assessment                        [facility] Adult PT Treatment/Exercise - 02/02/20 0001        Exercises   Exercises Ankle      Knee/Hip Exercises: Standing   Gait Training heel strike/ toe off taking smaller steps to reduce antalgic gait pattern 1 x80 ft      Manual Therapy   Manual Therapy Joint mobilization    Joint Mobilization talocrural AP/PA mobs grade III      Ankle Exercises: Stretches   Gastroc Stretch 2 reps;30 seconds   long sitting     Ankle Exercises: Seated   Towel Inversion/Eversion Weights (lbs) 1 x 10 bil    Heel Raises Both;20 reps    Toe Raise 20 reps    BAPS Level 2;Sitting   PF/DF inversion/ eversion   Heel Slides 10 reps;Right   holding end range 5 sec                 PT Education - 02/02/20 1716    Education Details reviewed HEP and updated today    Person(s) Educated Patient    Methods Explanation;Verbal cues;Handout    Comprehension Verbalized understanding;Verbal cues required  PT Short Term Goals - 01/27/20 1430      PT SHORT TERM GOAL #1   Title Pt will be Ind in an initial HEP    Baseline Started on eval    Time 3    Period Weeks    Status New    Target Date 02/17/20      PT SHORT TERM GOAL #2   Title Pt will voice understanding of RICE for pain and swelling management    Time 3    Period Weeks    Status New    Target Date 02/17/20      PT SHORT TERM GOAL #3   Title Assess TUG and 2 min walking test and set LTGs    Time 4    Period Weeks    Status New    Target Date 02/24/20             PT Long Term Goals - 01/27/20 1433      PT LONG TERM GOAL #1   Title Pt will report improvement in R ankle pain with daily activities to 4/10 or less    Baseline 5-8/10    Time 7    Period Weeks    Status New    Target Date 03/18/20      PT LONG TERM GOAL #2   Title Pt will walk 1000 ft s an AD demonstrating a heel/toe gait pattern.    Baseline In walking boot c crutches    Time 7    Period Weeks    Status New    Target Date 03/18/20      PT LONG TERM GOAL #3   Title Pt will be ind in a final  HEP    Time 7    Period Weeks    Status New    Target Date 03/18/20                 Plan - 02/02/20 1633    Clinical Impression Statement pt arrived to treatment continuing to use of her cam boot, but reports she has stopped using the crutuches. continued working on ankle ROM and general strengthening with motor control. She reported decreased pain/ stiffness following manual and stretch. practiced gait training taking smaller steps and using rocker on cam boot to promote toe off pattern.    PT Treatment/Interventions ADLs/Self Care Home Management;Cryotherapy;Electrical Stimulation;Moist Heat;Iontophoresis 4mg /ml Dexamethasone;Gait training;Functional mobility training;Stair training;Therapeutic activities;Therapeutic exercise;Balance training;Manual techniques;Patient/family education;Passive range of motion;Dry needling;Splinting;Taping;Vasopneumatic Device    PT Next Visit Plan Assess response to HEP and progress as indicated. Assess TUG and 2 min walking test when pt is off crutches and set LTGs    PT Home Exercise Plan [id number]    Consulted and Agree with Plan of Care Patient           Patient will benefit from skilled therapeutic intervention in order to improve the following deficits and impairments:  Abnormal gait, Difficulty walking, Decreased range of motion, Decreased activity tolerance, Decreased balance, Decreased mobility, Decreased strength, Impaired flexibility, Pain  Visit Diagnosis: Localized edema  Difficulty in walking, not elsewhere classified  Other abnormalities of gait and mobility  Muscle weakness (generalized)  Chronic pain of right ankle  Decreased range of motion of right ankle     Problem List Patient Active Problem List   Diagnosis Date Noted  . Screening for human immunodeficiency virus 01/21/2019  . Contraceptive management 01/21/2019  . Abnormal urine odor 01/21/2019  . Elevated transaminase level 01/21/2019  .  Encounter for  counseling regarding contraception 09/30/2018  . Screening for malignant neoplasm of cervix 09/30/2018  . Possible exposure to STD 09/30/2018  . Allergic reaction 08/09/2017  . Abnormal Pap smear of cervix 07/04/2016  . Tachycardia   . LBP (low back pain) 10/18/2011  . Bacterial vaginosis 10/16/2011  . Vaginal discharge 12/11/2010  . HIP PAIN, LEFT 02/22/2010  . TOBACCO ABUSE 04/07/2009   Lulu Riding PT, DPT, LAT, ATC  02/02/20  5:23 PM      [facility] [address] [place], [place], [address] Phone: [phone]   Fax:  [phone]  Name: Shannon Randolph MRN: [id number] Date of Birth: [birth year]

## 2020-02-03 ENCOUNTER — Ambulatory Visit: Payer: Medicaid Other | Admitting: Physical Therapy

## 2020-02-03 DIAGNOSIS — M25571 Pain in right ankle and joints of right foot: Secondary | ICD-10-CM

## 2020-02-03 DIAGNOSIS — M6281 Muscle weakness (generalized): Secondary | ICD-10-CM

## 2020-02-03 DIAGNOSIS — R262 Difficulty in walking, not elsewhere classified: Secondary | ICD-10-CM

## 2020-02-03 DIAGNOSIS — R600 Localized edema: Secondary | ICD-10-CM | POA: Diagnosis not present

## 2020-02-03 DIAGNOSIS — M25671 Stiffness of right ankle, not elsewhere classified: Secondary | ICD-10-CM

## 2020-02-03 DIAGNOSIS — G8929 Other chronic pain: Secondary | ICD-10-CM

## 2020-02-03 DIAGNOSIS — R2689 Other abnormalities of gait and mobility: Secondary | ICD-10-CM

## 2020-02-03 NOTE — Therapy (Signed)
[facility] [address] [place], [place], [address] Phone: [phone]   Fax:  [phone]  Physical Therapy Treatment  Patient Details  Name: Shannon Randolph MRN: [id number] Date of Birth: 04/28/1991 Referring Provider (PT): Yolonda Kida, MD   Encounter Date: 02/03/2020   PT End of Session - 02/03/20 1218    Visit Number 3    Number of Visits 4    Date for PT Re-Evaluation 02/12/20    Authorization Type [place] MEDICAID HEALTHY BLUE    Authorization - Visit Number 2    Authorization - Number of Visits 3    PT Start Time 1216    PT Stop Time 1306    PT Time Calculation (min) 50 min    Activity Tolerance Patient tolerated treatment well    Behavior During Therapy [facility] for tasks assessed/performed           Past Medical History:  Diagnosis Date  . Abortion   . Bacterial vaginosis   . History of kidney stones   . Infection    UTI, gonorrhea  . No pertinent past medical history     Past Surgical History:  Procedure Laterality Date  . NO PAST SURGERIES    . ORIF ANKLE FRACTURE Right 12/11/2019   Procedure: OPEN REDUCTION INTERNAL FIXATION (ORIF) ANKLE FRACTURE;  Surgeon: Yolonda Kida, MD;  Location: [facility] OR;  Service: Orthopedics;  Laterality: Right;  100 mins    There were no vitals filed for this visit.   Subjective Assessment - 02/03/20 1217    Subjective He said last week I was allowed to walk without crutches.  I have to wear to the boot until the end of the month right.               [facility] Adult PT Treatment/Exercise - 02/03/20 0001      Ambulation/Gait   Gait Comments 2 min walk test 225 feet with no device Rt Boot       Timed Up and Go Test   Normal TUG (seconds) 15      Cryotherapy   Number Minutes Cryotherapy 8 Minutes    Cryotherapy Location Ankle    Type of Cryotherapy Ice pack      Manual Therapy   Manual Therapy Passive ROM    Passive ROM mostly for assessment        Ankle Exercises: Seated   Ankle Circles/Pumps AROM;Strengthening;Both;20 reps    Towel Inversion/Eversion 4 reps    Towel Inversion/Eversion Weights (lbs) cues needed     Heel Raises Both;20 reps    Heel Slides 10 reps;Right   holding end range 5 sec   Other Seated Ankle Exercises used red band for circles , cues to keep leg still       Ankle Exercises: Supine   T-Band DF, Eversion, Inversion x 2 x 10  reps each with PT assisting    2 x 10   Other Supine Ankle Exercises supine toe/forefoot raises x 10, then heel raises  x 10     Other Supine Ankle Exercises red band x 20 patient holding band       Ankle Exercises: Stretches   Gastroc Stretch 2 reps;30 seconds   long sitting   Slant Board Stretch Limitations unable, done in stagger stance for Rt post ankle, calf     Other Stretch hamstring stretch 2 x 30 sec with strap       Ankle Exercises: Standing   Heel Raises  Both;20 reps                    PT Short Term Goals - 02/03/20 1246      PT SHORT TERM GOAL #1   Title Pt will be Ind in an initial HEP    Baseline needed cues today    Status On-going      PT SHORT TERM GOAL #2   Title Pt will voice understanding of RICE for pain and swelling management    Status Achieved      PT SHORT TERM GOAL #3   Title Assess TUG and 2 min walking test and set LTGs    Status Achieved             PT Long Term Goals - 01/27/20 1433      PT LONG TERM GOAL #1   Title Pt will report improvement in R ankle pain with daily activities to 4/10 or less    Baseline 5-8/10    Time 7    Period Weeks    Status New    Target Date 03/18/20      PT LONG TERM GOAL #2   Title Pt will walk 1000 ft s an AD demonstrating a heel/toe gait pattern.    Baseline In walking boot c crutches    Time 7    Period Weeks    Status New    Target Date 03/18/20      PT LONG TERM GOAL #3   Title Pt will be ind in a final HEP    Time 7    Period Weeks    Status New    Target Date 03/18/20                   Plan - 02/03/20 1247    Clinical Impression Statement Patient with significant limp when not using crutches.  Patient with weakness in all planes of Rt ankle, does not isolate motion and ankle joint well, hips compensate. She is benefitting from PT for Rt ankle function, gait and normalizing motor patterns.    PT Treatment/Interventions ADLs/Self Care Home Management;Cryotherapy;Electrical Stimulation;Moist Heat;Iontophoresis 4mg /ml Dexamethasone;Gait training;Functional mobility training;Stair training;Therapeutic activities;Therapeutic exercise;Balance training;Manual techniques;Patient/family education;Passive range of motion;Dry needling;Splinting;Taping;Vasopneumatic Device    PT Next Visit Plan add in therabnd exercises? progress standing, weightshifting, ankle ROM    PT Home Exercise Plan [id number]    Consulted and Agree with Plan of Care Patient           Patient will benefit from skilled therapeutic intervention in order to improve the following deficits and impairments:  Abnormal gait, Difficulty walking, Decreased range of motion, Decreased activity tolerance, Decreased balance, Decreased mobility, Decreased strength, Impaired flexibility, Pain  Visit Diagnosis: Localized edema  Difficulty in walking, not elsewhere classified  Other abnormalities of gait and mobility  Muscle weakness (generalized)  Chronic pain of right ankle  Decreased range of motion of right ankle     Problem List Patient Active Problem List   Diagnosis Date Noted  . Screening for human immunodeficiency virus 01/21/2019  . Contraceptive management 01/21/2019  . Abnormal urine odor 01/21/2019  . Elevated transaminase level 01/21/2019  . Encounter for counseling regarding contraception 09/30/2018  . Screening for malignant neoplasm of cervix 09/30/2018  . Possible exposure to STD 09/30/2018  . Allergic reaction 08/09/2017  . Abnormal Pap smear of cervix 07/04/2016  . Tachycardia    . LBP (low back pain) 10/18/2011  . Bacterial vaginosis 10/16/2011  .  Vaginal discharge 12/11/2010  . HIP PAIN, LEFT 02/22/2010  . TOBACCO ABUSE 04/07/2009    Nayleah Gamel 02/03/2020, 1:08 PM  [facility] [address] [place], [place], [address] Phone: [phone]   Fax:  [phone]  Name: Willean Schurman MRN: [id number] Date of Birth: Feb 28, 1991  Karie Mainland, PT 02/03/20 1:08 PM Phone: [phone] Fax: [phone]

## 2020-02-09 ENCOUNTER — Ambulatory Visit: Payer: Medicaid Other

## 2020-02-09 ENCOUNTER — Other Ambulatory Visit: Payer: Self-pay

## 2020-02-09 DIAGNOSIS — R600 Localized edema: Secondary | ICD-10-CM | POA: Diagnosis not present

## 2020-02-09 DIAGNOSIS — G8929 Other chronic pain: Secondary | ICD-10-CM

## 2020-02-09 DIAGNOSIS — M6281 Muscle weakness (generalized): Secondary | ICD-10-CM

## 2020-02-09 DIAGNOSIS — M25671 Stiffness of right ankle, not elsewhere classified: Secondary | ICD-10-CM

## 2020-02-09 DIAGNOSIS — R262 Difficulty in walking, not elsewhere classified: Secondary | ICD-10-CM

## 2020-02-09 DIAGNOSIS — R2689 Other abnormalities of gait and mobility: Secondary | ICD-10-CM

## 2020-02-09 NOTE — Patient Instructions (Signed)
Standing ankle inv/ev; zigzag c L LE support at counter; manual PF stretch

## 2020-02-09 NOTE — Therapy (Signed)
[facility] Outpatient Rehabilitation [facility] [address] [place], [place], [address] Phone: [phone]   Fax:  [phone]  Physical Therapy Treatment  Patient Details  Name: Shannon Randolph MRN: [id number] Date of Birth: June 27, 1990 Referring Provider (PT): Yolonda Kida, MD   Encounter Date: 02/09/2020   PT End of Session - 02/09/20 1319    Visit Number 4    Number of Visits 4    Date for PT Re-Evaluation 02/12/20    Authorization Type [place] MEDICAID HEALTHY BLUE    Authorization - Visit Number 3    Authorization - Number of Visits 3    PT Start Time 1230    PT Stop Time 1323    PT Time Calculation (min) 53 min    Activity Tolerance Patient tolerated treatment well    Behavior During Therapy [facility] for tasks assessed/performed           Past Medical History:  Diagnosis Date  . Abortion   . Bacterial vaginosis   . History of kidney stones   . Infection    UTI, gonorrhea  . No pertinent past medical history     Past Surgical History:  Procedure Laterality Date  . NO PAST SURGERIES    . ORIF ANKLE FRACTURE Right 12/11/2019   Procedure: OPEN REDUCTION INTERNAL FIXATION (ORIF) ANKLE FRACTURE;  Surgeon: Yolonda Kida, MD;  Location: [facility] OR;  Service: Orthopedics;  Laterality: Right;  100 mins    There were no vitals filed for this visit.   Subjective Assessment - 02/09/20 1233    Subjective pt reports her r ankle bothers her occasionally, esp after being on it for extended time frame    Patient Stated Goals To walk normal again    Currently in Pain? Yes    Pain Score 0-No pain   5/10 after extended use   Pain Location Ankle    Pain Orientation Right    Pain Descriptors / Indicators Aching    Pain Onset More than a month ago    Pain Frequency Occasional    Aggravating Factors  Extended time on feet    Pain Relieving Factors Rice              [facility] PT Assessment - 02/09/20 0001      PROM   Right Ankle Dorsiflexion 0                          [facility] Adult PT Treatment/Exercise - 02/09/20 0001      Ambulation/Gait   Gait Pattern Step-through pattern;Decreased dorsiflexion - right;Decreased weight shift to right;Decreased step length - left      Exercises   Exercises Ankle      Cryotherapy   Number Minutes Cryotherapy 10 Minutes    Cryotherapy Location Ankle    Type of Cryotherapy Ice pack      Ankle Exercises: Standing   Heel Raises Both;20 reps    Toe Raise 20 reps    Toe Raise Limitations Both    Other Standing Ankle Exercises ankle inv/ev zig zag c l LE support 3ftx4    Other Standing Ankle Exercises Lateral step up; 4 inch; 15x2      Ankle Exercises: Seated   Ankle Circles/Pumps AROM;Strengthening;Both;20 reps    Heel Slides 10 reps;Right   holding end range 10 sec   Other Seated Ankle Exercises DF stretch c strap; 3x; 30 sec    Other Seated Ankle Exercises PF stretch by  manual stretch 3x; 30 sec                  PT Education - 02/09/20 1318    Education Details HEP    Person(s) Educated Patient    Methods Explanation;Demonstration;Tactile cues;Verbal cues;Handout    Comprehension Verbalized understanding;Returned demonstration;Verbal cues required;Tactile cues required;Need further instruction            PT Short Term Goals - 02/03/20 1246      PT SHORT TERM GOAL #1   Title Pt will be Ind in an initial HEP    Baseline needed cues today    Status On-going      PT SHORT TERM GOAL #2   Title Pt will voice understanding of RICE for pain and swelling management    Status Achieved      PT SHORT TERM GOAL #3   Title Assess TUG and 2 min walking test and set LTGs    Status Achieved             PT Long Term Goals - 01/27/20 1433      PT LONG TERM GOAL #1   Title Pt will report improvement in R ankle pain with daily activities to 4/10 or less    Baseline 5-8/10    Time 7    Period Weeks    Status New    Target Date 03/18/20      PT LONG TERM GOAL #2     Title Pt will walk 1000 ft s an AD demonstrating a heel/toe gait pattern.    Baseline In walking boot c crutches    Time 7    Period Weeks    Status New    Target Date 03/18/20      PT LONG TERM GOAL #3   Title Pt will be ind in a final HEP    Time 7    Period Weeks    Status New    Target Date 03/18/20                 Plan - 02/09/20 1323    Clinical Impression Statement PT focused on strengthening and ROM of the R ankle incorporating CKC actvities which the pt tolerated well. r ankle DF has improved to 0d form -10 on eval.    Personal Factors and Comorbidities Fitness    Examination-Activity Limitations Stairs;Stand;Lift;Bend;Locomotion Level    Stability/Clinical Decision Making Stable/Uncomplicated    Clinical Decision Making Low    Rehab Potential Good    PT Frequency 2x / week    PT Duration 6 weeks    PT Treatment/Interventions ADLs/Self Care Home Management;Cryotherapy;Electrical Stimulation;Moist Heat;Iontophoresis 4mg /ml Dexamethasone;Gait training;Functional mobility training;Stair training;Therapeutic activities;Therapeutic exercise;Balance training;Manual techniques;Patient/family education;Passive range of motion;Dry needling;Splinting;Taping;Vasopneumatic Device    PT Next Visit Plan add in therabnd exercises? progress standing, weightshifting, ankle ROM    PT Home Exercise Plan [email or web address]. Standing ankle inv/ev; zigzag c L LE support at counter; manual PF stretch    Consulted and Agree with Plan of Care Patient           Patient will benefit from skilled therapeutic intervention in order to improve the following deficits and impairments:  Abnormal gait, Difficulty walking, Decreased range of motion, Decreased activity tolerance, Decreased balance, Decreased mobility, Decreased strength, Impaired flexibility, Pain  Visit Diagnosis: Localized edema  Difficulty in walking, not elsewhere classified  Other abnormalities of gait and mobility  Muscle  weakness (generalized)  Chronic pain of right ankle  Decreased range  of motion of right ankle     Problem List Patient Active Problem List   Diagnosis Date Noted  . Screening for human immunodeficiency virus 01/21/2019  . Contraceptive management 01/21/2019  . Abnormal urine odor 01/21/2019  . Elevated transaminase level 01/21/2019  . Encounter for counseling regarding contraception 09/30/2018  . Screening for malignant neoplasm of cervix 09/30/2018  . Possible exposure to STD 09/30/2018  . Allergic reaction 08/09/2017  . Abnormal Pap smear of cervix 07/04/2016  . Tachycardia   . LBP (low back pain) 10/18/2011  . Bacterial vaginosis 10/16/2011  . Vaginal discharge 12/11/2010  . HIP PAIN, LEFT 02/22/2010  . TOBACCO ABUSE 04/07/2009    Joellyn Rued MS, PT 02/09/20 1:31 PM  [facility] Outpatient Rehabilitation [facility] [address] [place], [place], [address] Phone: [phone]   Fax:  [phone]  Name: Aileen Amore MRN: [id number] Date of Birth: August 03, 1990

## 2020-02-11 ENCOUNTER — Ambulatory Visit: Payer: Medicaid Other

## 2020-02-11 ENCOUNTER — Other Ambulatory Visit: Payer: Self-pay

## 2020-02-11 DIAGNOSIS — M25671 Stiffness of right ankle, not elsewhere classified: Secondary | ICD-10-CM

## 2020-02-11 DIAGNOSIS — M6281 Muscle weakness (generalized): Secondary | ICD-10-CM

## 2020-02-11 DIAGNOSIS — R2689 Other abnormalities of gait and mobility: Secondary | ICD-10-CM

## 2020-02-11 DIAGNOSIS — R600 Localized edema: Secondary | ICD-10-CM

## 2020-02-11 DIAGNOSIS — G8929 Other chronic pain: Secondary | ICD-10-CM

## 2020-02-11 DIAGNOSIS — R262 Difficulty in walking, not elsewhere classified: Secondary | ICD-10-CM

## 2020-02-12 NOTE — Therapy (Signed)
[facility] Outpatient Rehabilitation [facility] [address] [place], [place], [address] Phone: [phone]   Fax:  [phone]  Physical Therapy Treatment/Recert  Patient Details  Name: Shannon Randolph MRN: [id number] Date of Birth: [birth year] Referring Provider (PT): Yolonda Kida, MD   Encounter Date: 02/11/2020   PT End of Session - 02/11/20 1103    Visit Number 5    Number of Visits 13    Date for PT Re-Evaluation 03/11/20    Authorization Type [place] MEDICAID HEALTHY BLUE    PT Start Time 1050    PT Stop Time 1133    PT Time Calculation (min) 43 min    Activity Tolerance Patient tolerated treatment well    Behavior During Therapy [facility] for tasks assessed/performed           Past Medical History:  Diagnosis Date  . Abortion   . Bacterial vaginosis   . History of kidney stones   . Infection    UTI, gonorrhea  . No pertinent past medical history     Past Surgical History:  Procedure Laterality Date  . NO PAST SURGERIES    . ORIF ANKLE FRACTURE Right 12/11/2019   Procedure: OPEN REDUCTION INTERNAL FIXATION (ORIF) ANKLE FRACTURE;  Surgeon: Yolonda Kida, MD;  Location: [facility] OR;  Service: Orthopedics;  Laterality: Right;  100 mins    There were no vitals filed for this visit.   Subjective Assessment - 02/11/20 1055    Subjective Pt reports her R ankle is not hurting, but it does not feel quite right.    Currently in Pain? No/denies    Pain Score 0-No pain                             [facility] Adult PT Treatment/Exercise - 02/12/20 0001      Ambulation/Gait   Gait Pattern Step-through pattern;Decreased dorsiflexion - right;Decreased weight shift to right;Decreased step length - left   primarily stays on lateral R foot     Exercises   Exercises Ankle;Knee/Hip      Knee/Hip Exercises: Standing   Lateral Step Up Right;20 reps;Hand Hold: 1;2 sets    Forward Step Up Right;2 sets;20 reps;Hand Hold: 1      Cryotherapy     Number Minutes Cryotherapy 10 Minutes    Cryotherapy Location Ankle    Type of Cryotherapy Ice pack      Ankle Exercises: Aerobic   Recumbent Bike 5 mins; L 1      Ankle Exercises: Stretches   Slant Board Stretch 3 reps;20 seconds      Ankle Exercises: Standing   Heel Raises Both;20 reps    Heel Raises Limitations 2 sets    Toe Raise 20 reps    Toe Raise Limitations Both; 2 sets      Ankle Exercises: Supine   T-Band Inv; ev RTB; 15x3                    PT Short Term Goals - 02/12/20 9242      PT SHORT TERM GOAL #1   Title Pt will be Ind in an initial HEP. Achieved    Status Achieved    Target Date 02/11/20      PT SHORT TERM GOAL #2   Title Pt will voice understanding of RICE for pain and swelling management. Achieved    Status Achieved    Target Date 02/11/20  PT SHORT TERM GOAL #3   Title Assess TUG and 2 min walking test and set LTGs. On-going    Status On-going             PT Long Term Goals - 01/27/20 1433      PT LONG TERM GOAL #1   Title Pt will report improvement in R ankle pain with daily activities to 4/10 or less    Baseline 5-8/10    Time 7    Period Weeks    Status New    Target Date 03/18/20      PT LONG TERM GOAL #2   Title Pt will walk 1000 ft s an AD demonstrating a heel/toe gait pattern.    Baseline In walking boot c crutches    Time 7    Period Weeks    Status New    Target Date 03/18/20      PT LONG TERM GOAL #3   Title Pt will be ind in a final HEP    Time 7    Period Weeks    Status New    Target Date 03/18/20                 Plan - 02/12/20 0640    Clinical Impression Statement To date, pt is making appropriate progress regarding pain and functional mobility with her R ankle. Quality of gait has improved re: pace. step length L is decreased over R LE andpt has a tendency to stay of the lateral side of her foot during stance. Pt will continue to benefit from PT 2w4 to increase ROM and strength to  maximize gairt quality and functional mobility.    Examination-Activity Limitations Stairs;Stand;Lift;Bend;Locomotion Level    Stability/Clinical Decision Making Stable/Uncomplicated    Clinical Decision Making Low    Rehab Potential Good    PT Frequency 2x / week    PT Duration --   5 weeks   PT Treatment/Interventions ADLs/Self Care Home Management;Cryotherapy;Electrical Stimulation;Moist Heat;Iontophoresis 4mg /ml Dexamethasone;Gait training;Functional mobility training;Stair training;Therapeutic activities;Therapeutic exercise;Balance training;Manual techniques;Patient/family education;Passive range of motion;Dry needling;Splinting;Taping;Vasopneumatic Device    PT Next Visit Plan Assess 2 min walking test. progress ther ex as indicated. Continue PT 2w5    PT Home Exercise Plan [email or web address]. Standing ankle inv/ev; zigzag c L LE support at counter; manual PF stretch    Consulted and Agree with Plan of Care Patient           Patient will benefit from skilled therapeutic intervention in order to improve the following deficits and impairments:  Abnormal gait, Difficulty walking, Decreased range of motion, Decreased activity tolerance, Decreased balance, Decreased mobility, Decreased strength, Impaired flexibility, Pain  Visit Diagnosis: Localized edema  Difficulty in walking, not elsewhere classified  Other abnormalities of gait and mobility  Muscle weakness (generalized)  Chronic pain of right ankle  Decreased range of motion of right ankle     Problem List Patient Active Problem List   Diagnosis Date Noted  . Screening for human immunodeficiency virus 01/21/2019  . Contraceptive management 01/21/2019  . Abnormal urine odor 01/21/2019  . Elevated transaminase level 01/21/2019  . Encounter for counseling regarding contraception 09/30/2018  . Screening for malignant neoplasm of cervix 09/30/2018  . Possible exposure to STD 09/30/2018  . Allergic reaction 08/09/2017  . Abnormal  Pap smear of cervix 07/04/2016  . Tachycardia   . LBP (low back pain) 10/18/2011  . Bacterial vaginosis 10/16/2011  . Vaginal discharge 12/11/2010  . HIP PAIN, LEFT 02/22/2010  .  TOBACCO ABUSE 04/07/2009    Joellyn Rued MS, PT 02/12/20 6:55 AM   [facility] [address] [place], [place], [address] Phone: [phone]   Fax:  [phone]  Name: Waylynn Benefiel   MRN: [id number] Date of Birth: [birth year]

## 2020-02-16 ENCOUNTER — Ambulatory Visit: Payer: Medicaid Other

## 2020-02-18 ENCOUNTER — Ambulatory Visit: Payer: Medicaid Other | Admitting: Physical Therapy

## 2020-02-19 DIAGNOSIS — Z20822 Contact with and (suspected) exposure to covid-19: Secondary | ICD-10-CM | POA: Diagnosis not present

## 2020-02-23 ENCOUNTER — Encounter: Payer: Self-pay | Admitting: Physical Therapy

## 2020-02-23 ENCOUNTER — Ambulatory Visit: Payer: Medicaid Other | Admitting: Physical Therapy

## 2020-02-23 ENCOUNTER — Other Ambulatory Visit: Payer: Self-pay

## 2020-02-23 DIAGNOSIS — R600 Localized edema: Secondary | ICD-10-CM | POA: Diagnosis not present

## 2020-02-23 DIAGNOSIS — M25671 Stiffness of right ankle, not elsewhere classified: Secondary | ICD-10-CM

## 2020-02-23 DIAGNOSIS — M6281 Muscle weakness (generalized): Secondary | ICD-10-CM

## 2020-02-23 DIAGNOSIS — R262 Difficulty in walking, not elsewhere classified: Secondary | ICD-10-CM

## 2020-02-23 DIAGNOSIS — R2689 Other abnormalities of gait and mobility: Secondary | ICD-10-CM

## 2020-02-23 DIAGNOSIS — M25571 Pain in right ankle and joints of right foot: Secondary | ICD-10-CM

## 2020-02-23 DIAGNOSIS — G8929 Other chronic pain: Secondary | ICD-10-CM

## 2020-02-23 NOTE — Therapy (Signed)
[facility] Outpatient Rehabilitation [facility] [address] [place], [place], [address] Phone: [phone]   Fax:  [phone]  Physical Therapy Treatment  Patient Details  Name: Shannon Randolph MRN: [id number] Date of Birth: [birth year] Referring Provider (PT): Yolonda Kida, MD   Encounter Date: 02/23/2020   PT End of Session - 02/23/20 1132    Visit Number 6    Number of Visits 13    Date for PT Re-Evaluation 03/11/20    Authorization Type [place] MEDICAID HEALTHY BLUE    Authorization Time Period 8 visits 02/02/20 to 02/25/20    Authorization - Visit Number 5    Authorization - Number of Visits 12    PT Start Time 1130    PT Stop Time 1214    PT Time Calculation (min) 44 min    Activity Tolerance Patient tolerated treatment well    Behavior During Therapy [facility] for tasks assessed/performed           Past Medical History:  Diagnosis Date  . Abortion   . Bacterial vaginosis   . History of kidney stones   . Infection    UTI, gonorrhea  . No pertinent past medical history     Past Surgical History:  Procedure Laterality Date  . NO PAST SURGERIES    . ORIF ANKLE FRACTURE Right 12/11/2019   Procedure: OPEN REDUCTION INTERNAL FIXATION (ORIF) ANKLE FRACTURE;  Surgeon: Yolonda Kida, MD;  Location: [facility] OR;  Service: Orthopedics;  Laterality: Right;  100 mins    There were no vitals filed for this visit.   Subjective Assessment - 02/23/20 1136    Subjective Pt had a virus, was tested for COVID x 2, neg.  She was mostly in the bed for her illness.  Ankle hurts with gait 5/10.    Currently in Pain? No/denies   5/10 with gait intermittent   Pain Location Ankle    Pain Orientation Right    Pain Descriptors / Indicators Dull    Pain Type Chronic pain    Pain Onset More than a month ago    Pain Frequency Intermittent    Aggravating Factors  walking    Pain Relieving Factors rest               [facility] Adult PT Treatment/Exercise - 02/23/20  0001      Ambulation/Gait   Pre-Gait Activities Rt toes turnout, decreased DF     Gait Comments 2 MWT 368 feet       Knee/Hip Exercises: Standing   Lateral Step Up Right;1 set;15 reps;Hand Hold: 1;Step Height: 4"    Lateral Step Up Limitations unable to do on a 6 inch step    Functional Squat 10 reps    Functional Squat Limitations countertop, full squat with UE assist for stretching       Ankle Exercises: Standing   SLS static Rt LE x 30 sec with light UE     Heel Raises Both;20 reps    Other Standing Ankle Exercises SLS with abd vector x 15 , mod cues       Ankle Exercises: Aerobic   Recumbent Bike 5 mins; L 1      Ankle Exercises: Stretches   Gastroc Stretch 3 reps;30 seconds      Ankle Exercises: Seated   Other Seated Ankle Exercises ankle Eversion and inversion red band done with various techniques  PT Education - 02/23/20 1322    Education Details upgraded HEP, step ups, downs.  ankle ROM    Person(s) Educated Patient    Methods Explanation;Demonstration    Comprehension Verbalized understanding;Returned demonstration;Need further instruction            PT Short Term Goals - 02/12/20 1601      PT SHORT TERM GOAL #1   Title Pt will be Ind in an initial HEP. Achieved    Status Achieved    Target Date 02/11/20      PT SHORT TERM GOAL #2   Title Pt will voice understanding of RICE for pain and swelling management. Achieved    Status Achieved    Target Date 02/11/20      PT SHORT TERM GOAL #3   Title Assess TUG and 2 min walking test and set LTGs. On-going    Status On-going             PT Long Term Goals - 01/27/20 1433      PT LONG TERM GOAL #1   Title Pt will report improvement in R ankle pain with daily activities to 4/10 or less    Baseline 5-8/10    Time 7    Period Weeks    Status New    Target Date 03/18/20      PT LONG TERM GOAL #2   Title Pt will walk 1000 ft s an AD demonstrating a heel/toe gait pattern.     Baseline In walking boot c crutches    Time 7    Period Weeks    Status New    Target Date 03/18/20      PT LONG TERM GOAL #3   Title Pt will be ind in a final HEP    Time 7    Period Weeks    Status New    Target Date 03/18/20                 Plan - 02/23/20 1140    Clinical Impression Statement Patient was sick and did not do much exercise or walking.  She reports a limp and pain with walking too much. She has limited ankle ROM for step ups, compensates for normal gait with ankle eversion and decreased ankle control, stability.  She will benefit from PT to improve gait and increase overall ease of mobility.    Personal Factors and Comorbidities Fitness    Examination-Activity Limitations Stairs;Stand;Lift;Bend;Locomotion Level    PT Frequency 2x / week    PT Duration 4 weeks    PT Treatment/Interventions ADLs/Self Care Home Management;Cryotherapy;Electrical Stimulation;Moist Heat;Iontophoresis 4mg /ml Dexamethasone;Gait training;Functional mobility training;Stair training;Therapeutic activities;Therapeutic exercise;Balance training;Manual techniques;Patient/family education;Passive range of motion;Dry needling;Splinting;Taping;Vasopneumatic Device    PT Next Visit Plan squat, ankle DF step ups, downs    PT Home Exercise Plan [email or web address]. Standing ankle inv/ev; zigzag c L LE support at counter; manual PF stretch    Consulted and Agree with Plan of Care Patient           Patient will benefit from skilled therapeutic intervention in order to improve the following deficits and impairments:  Abnormal gait, Difficulty walking, Decreased range of motion, Decreased activity tolerance, Decreased balance, Decreased mobility, Decreased strength, Impaired flexibility, Pain  Visit Diagnosis: Localized edema  Difficulty in walking, not elsewhere classified  Muscle weakness (generalized)  Other abnormalities of gait and mobility  Chronic pain of right ankle  Decreased range of motion  of right ankle  Problem List Patient Active Problem List   Diagnosis Date Noted  . Screening for human immunodeficiency virus 01/21/2019  . Contraceptive management 01/21/2019  . Abnormal urine odor 01/21/2019  . Elevated transaminase level 01/21/2019  . Encounter for counseling regarding contraception 09/30/2018  . Screening for malignant neoplasm of cervix 09/30/2018  . Possible exposure to STD 09/30/2018  . Allergic reaction 08/09/2017  . Abnormal Pap smear of cervix 07/04/2016  . Tachycardia   . LBP (low back pain) 10/18/2011  . Bacterial vaginosis 10/16/2011  . Vaginal discharge 12/11/2010  . HIP PAIN, LEFT 02/22/2010  . TOBACCO ABUSE 04/07/2009    Wilian Kwong 02/23/2020, 1:35 PM  [facility] [address] [place], [place], [address] Phone: [phone]   Fax:  [phone]  Name: Shannon Randolph MRN: [id number] Date of Birth: 23-Apr-1991  Karie Mainland, PT 02/23/20 1:35 PM Phone: [phone] Fax: [phone]

## 2020-02-23 NOTE — Patient Instructions (Signed)
Access Code: [email or web address]: [email or web address]: 09/28/2021Prepared by: Victorino Dike PaaExercises  Long Sitting Calf Stretch with Strap - 2 x daily - 7 x weekly - 1 sets - 5 reps - 30 hold  Supine Ankle Circles - 2-3 x daily - 7 x weekly - 1 sets - 10 reps  Seated Ankle Alphabet - 2 x daily - 7 x weekly  Long Sitting Ankle Eversion with Resistance - 2 x daily - 7 x weekly - 2 sets - 10 reps - 5 hold  Seated Figure 4 Ankle Inversion with Resistance - 2 x daily - 7 x weekly - 2 sets - 10 reps - 5 hold  Heel rises with counter support - 2 x daily - 7 x weekly - 2 sets - 10 reps - 3 hold  Squat with Chair Support - 2 x daily - 7 x weekly - 2 sets - 10 reps - 3 hold

## 2020-02-25 ENCOUNTER — Encounter: Payer: Self-pay | Admitting: Physical Therapy

## 2020-02-25 ENCOUNTER — Ambulatory Visit: Payer: Medicaid Other | Admitting: Physical Therapy

## 2020-02-25 ENCOUNTER — Other Ambulatory Visit: Payer: Self-pay

## 2020-02-25 DIAGNOSIS — R600 Localized edema: Secondary | ICD-10-CM

## 2020-02-25 DIAGNOSIS — M6281 Muscle weakness (generalized): Secondary | ICD-10-CM

## 2020-02-25 DIAGNOSIS — R262 Difficulty in walking, not elsewhere classified: Secondary | ICD-10-CM

## 2020-02-25 DIAGNOSIS — M25671 Stiffness of right ankle, not elsewhere classified: Secondary | ICD-10-CM

## 2020-02-25 DIAGNOSIS — R2689 Other abnormalities of gait and mobility: Secondary | ICD-10-CM

## 2020-02-25 DIAGNOSIS — G8929 Other chronic pain: Secondary | ICD-10-CM

## 2020-02-25 NOTE — Therapy (Signed)
[facility] Outpatient Rehabilitation [facility] [address] [place], [place], [address] Phone: [phone]   Fax:  [phone]  Physical Therapy Treatment/Renewal  Patient Details  Name: Shannon Randolph MRN: [id number] Date of Birth: 07/14/1990 Referring Provider (PT): Yolonda Kida, MD   Encounter Date: 02/25/2020   PT End of Session - 02/25/20 1142    Visit Number 7    Number of Visits 13    Date for PT Re-Evaluation 04/07/20    Authorization Type [place] MEDICAID HEALTHY BLUE    Authorization Time Period 8 visits 02/02/20 to 02/25/20    Authorization - Visit Number 6    Authorization - Number of Visits 12    PT Start Time 1134    PT Stop Time 1215    PT Time Calculation (min) 41 min    Activity Tolerance Patient tolerated treatment well    Behavior During Therapy [facility] for tasks assessed/performed           Past Medical History:  Diagnosis Date  . Abortion   . Bacterial vaginosis   . History of kidney stones   . Infection    UTI, gonorrhea  . No pertinent past medical history     Past Surgical History:  Procedure Laterality Date  . NO PAST SURGERIES    . ORIF ANKLE FRACTURE Right 12/11/2019   Procedure: OPEN REDUCTION INTERNAL FIXATION (ORIF) ANKLE FRACTURE;  Surgeon: Yolonda Kida, MD;  Location: [facility] OR;  Service: Orthopedics;  Laterality: Right;  100 mins    There were no vitals filed for this visit.   Subjective Assessment - 02/25/20 1141    Subjective Was real sore yesterday, I need to work on my ankle turning outward when I walk.    Currently in Pain? No/denies              [facility] PT Assessment - 02/25/20 0001      AROM   Right Ankle Dorsiflexion 0    Right Ankle Plantar Flexion 40    Right Ankle Inversion 30    Right Ankle Eversion 10      Strength   Right Ankle Dorsiflexion 4+/5    Right Ankle Plantar Flexion 4/5    Right Ankle Inversion 4-/5    Right Ankle Eversion 4-/5              [facility] Adult PT  Treatment/Exercise - 02/25/20 0001      Pilates   Pilates Reformer footwork 2 Red 1 blue , heels andthen toes.  Rt LE needs cues to maintain neutral and toe position    parallel and turnout      Knee/Hip Exercises: Standing   Forward Lunges Both;1 set;10 reps    SLS with Vectors extension with swing through to high march , 1-2 UE needed       Ankle Exercises: Seated   Ankle Circles/Pumps 15 reps    Towel Inversion/Eversion 5 reps    Other Seated Ankle Exercises arch curl x 3 min towel                   PT Education - 02/25/20 1511    Education Details POC, LE alignment    Person(s) Educated Patient    Methods Explanation    Comprehension Verbalized understanding            PT Short Term Goals - 02/25/20 1207      PT SHORT TERM GOAL #1   Title Pt will be Ind in an  initial HEP. Achieved    Status Achieved      PT SHORT TERM GOAL #2   Title Pt will voice understanding of RICE for pain and swelling management. Achieved    Status Achieved      PT SHORT TERM GOAL #3   Title Assess TUG and 2 min walking test and set LTGs. On-going    Status Achieved             PT Long Term Goals - 02/25/20 1207      PT LONG TERM GOAL #1   Title Pt will report improvement in R ankle pain with daily activities to 4/10 or less    Time 6    Period Weeks    Status Achieved      PT LONG TERM GOAL #2   Title Pt will walk 1000 ft s an AD demonstrating a heel/toe gait pattern and better foot and ankle alignment    Baseline RT foot turns outward, knees and ankles stiff bilaterally    Time 6    Period Weeks    Status On-going    Target Date 04/07/20      PT LONG TERM GOAL #3   Title Pt will be in in a final HEP    Baseline up to date    Time 6    Period Weeks    Status On-going    Target Date 04/07/20      PT LONG TERM GOAL #4   Title Pt will be able to negotiate stairs with a normal pattern, holding 1 rail, less hip compensation.    Baseline turns sideways, step to  pattern    Time 6    Period Weeks    Status New    Target Date 04/07/20      PT LONG TERM GOAL #5   Title Pt will increase ankle strength to 5/5 in all planes to allow stability to walking    Baseline 3+/5 to 4+/5    Time 6    Period Weeks    Status New    Target Date 04/07/20                 Plan - 02/25/20 1512    Clinical Impression Statement Patient was renewed today as she did not complete the amount of authorized visits.  She will benefit from PT to improve her foot and ankle strength, stability and ambulation in the community.    Personal Factors and Comorbidities Fitness    Examination-Activity Limitations Stairs;Stand;Lift;Bend;Locomotion Level    PT Frequency 2x / week    PT Duration 4 weeks    PT Treatment/Interventions ADLs/Self Care Home Management;Cryotherapy;Electrical Stimulation;Moist Heat;Iontophoresis 4mg /ml Dexamethasone;Gait training;Functional mobility training;Stair training;Therapeutic activities;Therapeutic exercise;Balance training;Manual techniques;Patient/family education;Passive range of motion;Dry needling;Splinting;Taping;Vasopneumatic Device    PT Next Visit Plan squat, ankle DF step ups, downs    PT Home Exercise Plan [id number]. Standing ankle inv/ev; zigzag c L LE support at counter; manual PF stretch    Consulted and Agree with Plan of Care Patient           Patient will benefit from skilled therapeutic intervention in order to improve the following deficits and impairments:  Abnormal gait, Difficulty walking, Decreased range of motion, Decreased activity tolerance, Decreased balance, Decreased mobility, Decreased strength, Impaired flexibility, Pain  Visit Diagnosis: Localized edema  Difficulty in walking, not elsewhere classified  Muscle weakness (generalized)  Other abnormalities of gait and mobility  Chronic pain of right ankle  Decreased  range of motion of right ankle     Problem List Patient Active Problem List    Diagnosis Date Noted  . Screening for human immunodeficiency virus 01/21/2019  . Contraceptive management 01/21/2019  . Abnormal urine odor 01/21/2019  . Elevated transaminase level 01/21/2019  . Encounter for counseling regarding contraception 09/30/2018  . Screening for malignant neoplasm of cervix 09/30/2018  . Possible exposure to STD 09/30/2018  . Allergic reaction 08/09/2017  . Abnormal Pap smear of cervix 07/04/2016  . Tachycardia   . LBP (low back pain) 10/18/2011  . Bacterial vaginosis 10/16/2011  . Vaginal discharge 12/11/2010  . HIP PAIN, LEFT 02/22/2010  . TOBACCO ABUSE 04/07/2009    Naureen Benton 02/25/2020, 3:46 PM  [facility] [address] [place], [place], [address] Phone: [phone]   Fax:  [phone]  Name: Tessia Kassin MRN: [id number] Date of Birth: 02-21-1991  Karie Mainland, PT 02/25/20 3:46 PM Phone: [phone] Fax: [phone]

## 2020-03-01 ENCOUNTER — Ambulatory Visit: Payer: Medicaid Other | Admitting: Physical Therapy

## 2020-03-08 DIAGNOSIS — M25571 Pain in right ankle and joints of right foot: Secondary | ICD-10-CM | POA: Diagnosis not present

## 2020-03-12 DIAGNOSIS — R1030 Lower abdominal pain, unspecified: Secondary | ICD-10-CM | POA: Diagnosis not present

## 2020-03-12 DIAGNOSIS — R102 Pelvic and perineal pain: Secondary | ICD-10-CM | POA: Diagnosis not present

## 2020-03-12 DIAGNOSIS — A599 Trichomoniasis, unspecified: Secondary | ICD-10-CM | POA: Diagnosis not present

## 2020-03-15 ENCOUNTER — Ambulatory Visit: Payer: Medicaid Other | Attending: Orthopedic Surgery

## 2020-03-15 ENCOUNTER — Other Ambulatory Visit: Payer: Self-pay

## 2020-03-15 DIAGNOSIS — R2689 Other abnormalities of gait and mobility: Secondary | ICD-10-CM

## 2020-03-15 DIAGNOSIS — M6281 Muscle weakness (generalized): Secondary | ICD-10-CM

## 2020-03-15 DIAGNOSIS — G8929 Other chronic pain: Secondary | ICD-10-CM

## 2020-03-15 DIAGNOSIS — M25571 Pain in right ankle and joints of right foot: Secondary | ICD-10-CM | POA: Diagnosis not present

## 2020-03-15 DIAGNOSIS — R600 Localized edema: Secondary | ICD-10-CM

## 2020-03-15 DIAGNOSIS — R262 Difficulty in walking, not elsewhere classified: Secondary | ICD-10-CM | POA: Diagnosis not present

## 2020-03-15 DIAGNOSIS — M25671 Stiffness of right ankle, not elsewhere classified: Secondary | ICD-10-CM

## 2020-03-15 NOTE — Therapy (Signed)
[facility] Outpatient Rehabilitation [facility] [address] [place], [place], [address] Phone: [phone]   Fax:  [phone]  Physical Therapy Treatment  Patient Details  Name: Shannon Randolph MRN: [id number] Date of Birth: 12/31/1990 Referring Provider (PT): Yolonda Kida, MD   Encounter Date: 03/15/2020   PT End of Session - 03/15/20 0946    Visit Number 8    Number of Visits 13    Date for PT Re-Evaluation 04/07/20    Authorization Type [place] MEDICAID HEALTHY BLUE    Authorization Time Period 8 visits 02/02/20 to 02/25/20    Authorization - Visit Number 6    Authorization - Number of Visits 12    PT Start Time 0933    PT Stop Time 1020    PT Time Calculation (min) 47 min    Activity Tolerance Patient tolerated treatment well    Behavior During Therapy [facility] for tasks assessed/performed           Past Medical History:  Diagnosis Date   Abortion    Bacterial vaginosis    History of kidney stones    Infection    UTI, gonorrhea   No pertinent past medical history     Past Surgical History:  Procedure Laterality Date   NO PAST SURGERIES     ORIF ANKLE FRACTURE Right 12/11/2019   Procedure: OPEN REDUCTION INTERNAL FIXATION (ORIF) ANKLE FRACTURE;  Surgeon: Yolonda Kida, MD;  Location: [facility] OR;  Service: Orthopedics;  Laterality: Right;  100 mins    There were no vitals filed for this visit.   Subjective Assessment - 03/15/20 0939    Subjective Pt reports she still can't "walk up the steps right, get in the car right, sleep normally". She still can't walk well and turns her foot out to go upstairs, nonreciprocal to descend leading with RLE d/t lack of DF.    Currently in Pain? No/denies                             [facility] Adult PT Treatment/Exercise - 03/15/20 0001      Ambulation/Gait   Stairs --    Gait Comments improved after footwork and ankle mob/stretching; Stairs: 5 laps 4" with slow speed and focus on  neutral ankle; 6" slow x 5 with pt lifting heel to perform and B HR; x 10 slow step downs      Pilates   Pilates Reformer footwork 2 Red 1 blue, on toes in     (heel lifts, running, jump board all springs with black TB mobilizing TCJ posterior and manually approximating heels for press outs x 15) Rt LE needs cues to maintain neutral and toe position    parallel     Knee/Hip Exercises: Aerobic   Nustep L5 UE/LE 5'   focused on R ankle DF and RLE HKA alignment     Ankle Exercises: Standing   Other Standing Ankle Exercises Squats holding footbar at highest point; PT approximating R ankle to keep heel down x 15   cues for leading from hips and keeping heels down     Ankle Exercises: Stretches   Other Stretch knee to wall DF mobs x 15                    PT Short Term Goals - 02/25/20 1207      PT SHORT TERM GOAL #1   Title Pt will be Ind in an  initial HEP. Achieved    Status Achieved      PT SHORT TERM GOAL #2   Title Pt will voice understanding of RICE for pain and swelling management. Achieved    Status Achieved      PT SHORT TERM GOAL #3   Title Assess TUG and 2 min walking test and set LTGs. On-going    Status Achieved             PT Long Term Goals - 02/25/20 1207      PT LONG TERM GOAL #1   Title Pt will report improvement in R ankle pain with daily activities to 4/10 or less    Time 6    Period Weeks    Status Achieved      PT LONG TERM GOAL #2   Title Pt will walk 1000 ft s an AD demonstrating a heel/toe gait pattern and better foot and ankle alignment    Baseline RT foot turns outward, knees and ankles stiff bilaterally    Time 6    Period Weeks    Status On-going    Target Date 04/07/20      PT LONG TERM GOAL #3   Title Pt will be in in a final HEP    Baseline up to date    Time 6    Period Weeks    Status On-going    Target Date 04/07/20      PT LONG TERM GOAL #4   Title Pt will be able to negotiate stairs with a normal pattern, holding 1  rail, less hip compensation.    Baseline turns sideways, step to pattern    Time 6    Period Weeks    Status New    Target Date 04/07/20      PT LONG TERM GOAL #5   Title Pt will increase ankle strength to 5/5 in all planes to allow stability to walking    Baseline 3+/5 to 4+/5    Time 6    Period Weeks    Status New    Target Date 04/07/20                 Plan - 03/15/20 0946    Clinical Impression Statement Pt tolerated tx well today, reporting "this is the best I've felt after any session", at end of visit after focusing on static and dynamic DF with footwork on reformer on footbar and jump board, stairs training 4-6", squats and gait.    Personal Factors and Comorbidities Fitness    Examination-Activity Limitations Stairs;Stand;Lift;Bend;Locomotion Level    PT Frequency 2x / week    PT Duration 4 weeks    PT Treatment/Interventions ADLs/Self Care Home Management;Cryotherapy;Electrical Stimulation;Moist Heat;Iontophoresis 4mg /ml Dexamethasone;Gait training;Functional mobility training;Stair training;Therapeutic activities;Therapeutic exercise;Balance training;Manual techniques;Patient/family education;Passive range of motion;Dry needling;Splinting;Taping;Vasopneumatic Device    PT Next Visit Plan squat, ankle DF step ups, downs; reformer footwork on and jump board; stairs    PT Home Exercise Plan [id number]. Standing ankle inv/ev; zigzag c L LE support at counter; manual PF stretch    Consulted and Agree with Plan of Care Patient           Patient will benefit from skilled therapeutic intervention in order to improve the following deficits and impairments:  Abnormal gait, Difficulty walking, Decreased range of motion, Decreased activity tolerance, Decreased balance, Decreased mobility, Decreased strength, Impaired flexibility, Pain  Visit Diagnosis: Localized edema  Difficulty in walking, not elsewhere classified  Muscle weakness (  generalized)  Other  abnormalities of gait and mobility  Chronic pain of right ankle  Decreased range of motion of right ankle     Problem List Patient Active Problem List   Diagnosis Date Noted   Screening for human immunodeficiency virus 01/21/2019   Contraceptive management 01/21/2019   Abnormal urine odor 01/21/2019   Elevated transaminase level 01/21/2019   Encounter for counseling regarding contraception 09/30/2018   Screening for malignant neoplasm of cervix 09/30/2018   Possible exposure to STD 09/30/2018   Allergic reaction 08/09/2017   Abnormal Pap smear of cervix 07/04/2016   Tachycardia    LBP (low back pain) 10/18/2011   Bacterial vaginosis 10/16/2011   Vaginal discharge 12/11/2010   HIP PAIN, LEFT 02/22/2010   TOBACCO ABUSE 04/07/2009    Marcelline Mates, PT, DPT 03/15/2020, 10:48 AM  [facility] Outpatient Rehabilitation [facility] [address] [place], [place], [address] Phone: [phone]   Fax:  [phone]  Name: Shannon Randolph MRN: [id number] Date of Birth: [birth year]

## 2020-03-17 ENCOUNTER — Encounter: Payer: Self-pay | Admitting: Physical Therapy

## 2020-03-17 ENCOUNTER — Other Ambulatory Visit: Payer: Self-pay

## 2020-03-17 ENCOUNTER — Ambulatory Visit: Payer: Medicaid Other | Admitting: Physical Therapy

## 2020-03-17 DIAGNOSIS — R2689 Other abnormalities of gait and mobility: Secondary | ICD-10-CM | POA: Diagnosis not present

## 2020-03-17 DIAGNOSIS — M6281 Muscle weakness (generalized): Secondary | ICD-10-CM | POA: Diagnosis not present

## 2020-03-17 DIAGNOSIS — M25571 Pain in right ankle and joints of right foot: Secondary | ICD-10-CM | POA: Diagnosis not present

## 2020-03-17 DIAGNOSIS — M25671 Stiffness of right ankle, not elsewhere classified: Secondary | ICD-10-CM

## 2020-03-17 DIAGNOSIS — R262 Difficulty in walking, not elsewhere classified: Secondary | ICD-10-CM

## 2020-03-17 DIAGNOSIS — R600 Localized edema: Secondary | ICD-10-CM

## 2020-03-17 DIAGNOSIS — G8929 Other chronic pain: Secondary | ICD-10-CM

## 2020-03-17 NOTE — Patient Instructions (Addendum)
Soleus, Standing    Stand, right foot behind, heel on floor and turned slightly out, both knees bent. Lean into wall until stretch is felt in lower calf. Hold __30_ seconds. Repeat __3_ times per session. Do __2_ sessions per day.  Copyright  VHI. All rights reserved.

## 2020-03-17 NOTE — Therapy (Signed)
[facility] Outpatient Rehabilitation [facility] [address] [place], [place], [address] Phone: [phone]   Fax:  [phone]  Physical Therapy Treatment  Patient Details  Name: Shannon Randolph MRN: [id number] Date of Birth: April 05, 1991 Referring Provider (PT): Yolonda Kida, MD   Encounter Date: 03/17/2020   PT End of Session - 03/17/20 1143    Visit Number 9    Number of Visits 13    Date for PT Re-Evaluation 04/07/20    Authorization Type [place] MEDICAID HEALTHY BLUE    PT Start Time 1139    PT Stop Time 1215    PT Time Calculation (min) 36 min    Activity Tolerance Patient tolerated treatment well    Behavior During Therapy [facility] for tasks assessed/performed           Past Medical History:  Diagnosis Date  . Abortion   . Bacterial vaginosis   . History of kidney stones   . Infection    UTI, gonorrhea  . No pertinent past medical history     Past Surgical History:  Procedure Laterality Date  . NO PAST SURGERIES    . ORIF ANKLE FRACTURE Right 12/11/2019   Procedure: OPEN REDUCTION INTERNAL FIXATION (ORIF) ANKLE FRACTURE;  Surgeon: Yolonda Kida, MD;  Location: [facility] OR;  Service: Orthopedics;  Laterality: Right;  100 mins    There were no vitals filed for this visit.   Subjective Assessment - 03/17/20 1143    Subjective No pain , not sore after workout.  Arrives late.               [facility] Adult PT Treatment/Exercise - 03/17/20 0001      Knee/Hip Exercises: Standing   Heel Raises Both    Heel Raises Limitations x 20 with ball     Forward Lunges Limitations on step for manual     Hip Abduction Both;1 set;15 reps    Abduction Limitations then used red band for abduction and extension x 10 each     Forward Step Up Right;2 sets;10 reps;Hand Hold: 1;Hand Hold: 0;Step Height: 6"    Forward Step Up Limitations worked on SLS at top fo step     Functional Squat 10 reps    Functional Squat Limitations red band 2 sets       Ankle  Exercises: Aerobic   Elliptical 3 min , stopped after this, fatigue, L6 ramp L1 resist.       Ankle Exercises: Stretches   Plantar Fascia Stretch 3 reps;30 seconds    Other Stretch used wall for soleus x 2 each, unable                  PT Education - 03/17/20 1611    Education Details soleus, technique for stretching    Person(s) Educated Patient    Methods Explanation;Demonstration;Handout    Comprehension Verbalized understanding;Returned demonstration            PT Short Term Goals - 02/25/20 1207      PT SHORT TERM GOAL #1   Title Pt will be Ind in an initial HEP. Achieved    Status Achieved      PT SHORT TERM GOAL #2   Title Pt will voice understanding of RICE for pain and swelling management. Achieved    Status Achieved      PT SHORT TERM GOAL #3   Title Assess TUG and 2 min walking test and set LTGs. On-going    Status Achieved  PT Long Term Goals - 02/25/20 1207      PT LONG TERM GOAL #1   Title Pt will report improvement in R ankle pain with daily activities to 4/10 or less    Time 6    Period Weeks    Status Achieved      PT LONG TERM GOAL #2   Title Pt will walk 1000 ft s an AD demonstrating a heel/toe gait pattern and better foot and ankle alignment    Baseline RT foot turns outward, knees and ankles stiff bilaterally    Time 6    Period Weeks    Status On-going    Target Date 04/07/20      PT LONG TERM GOAL #3   Title Pt will be in in a final HEP    Baseline up to date    Time 6    Period Weeks    Status On-going    Target Date 04/07/20      PT LONG TERM GOAL #4   Title Pt will be able to negotiate stairs with a normal pattern, holding 1 rail, less hip compensation.    Baseline turns sideways, step to pattern    Time 6    Period Weeks    Status New    Target Date 04/07/20      PT LONG TERM GOAL #5   Title Pt will increase ankle strength to 5/5 in all planes to allow stability to walking    Baseline 3+/5 to 4+/5     Time 6    Period Weeks    Status New    Target Date 04/07/20                 Plan - 03/17/20 1612    Clinical Impression Statement Patient needs cues throughout session to maintain heels on step, floor during exercise.  DF to neutral, weakness noted in inversion> eversion. She will cont to benefit from conditioning and stretching to ankle to improve gait and mobility.    PT Treatment/Interventions ADLs/Self Care Home Management;Cryotherapy;Electrical Stimulation;Moist Heat;Iontophoresis 4mg /ml Dexamethasone;Gait training;Functional mobility training;Stair training;Therapeutic activities;Therapeutic exercise;Balance training;Manual techniques;Patient/family education;Passive range of motion;Dry needling;Splinting;Taping;Vasopneumatic Device    PT Next Visit Plan squat, ankle DF step ups, downs; reformer footwork on and jump board; stairs    PT Home Exercise Plan [id number]. Standing ankle inv/ev; zigzag c L LE support at counter; manual PF stretch, soleus    Consulted and Agree with Plan of Care Patient           Patient will benefit from skilled therapeutic intervention in order to improve the following deficits and impairments:  Abnormal gait, Difficulty walking, Decreased range of motion, Decreased activity tolerance, Decreased balance, Decreased mobility, Decreased strength, Impaired flexibility, Pain  Visit Diagnosis: Localized edema  Difficulty in walking, not elsewhere classified  Muscle weakness (generalized)  Other abnormalities of gait and mobility  Chronic pain of right ankle  Decreased range of motion of right ankle     Problem List Patient Active Problem List   Diagnosis Date Noted  . Screening for human immunodeficiency virus 01/21/2019  . Contraceptive management 01/21/2019  . Abnormal urine odor 01/21/2019  . Elevated transaminase level 01/21/2019  . Encounter for counseling regarding contraception 09/30/2018  . Screening for malignant  neoplasm of cervix 09/30/2018  . Possible exposure to STD 09/30/2018  . Allergic reaction 08/09/2017  . Abnormal Pap smear of cervix 07/04/2016  . Tachycardia   . LBP (low back pain) 10/18/2011  .  Bacterial vaginosis 10/16/2011  . Vaginal discharge 12/11/2010  . HIP PAIN, LEFT 02/22/2010  . TOBACCO ABUSE 04/07/2009    Shannon Randolph 03/17/2020, 4:16 PM  [facility] [address] [place], [place], [address] Phone: [phone]   Fax:  [phone]  Name: Shannon Randolph MRN: [id number] Date of Birth: 08-Apr-1991  Karie Mainland, PT 03/17/20 4:16 PM Phone: [phone] Fax: [phone]

## 2020-03-22 ENCOUNTER — Ambulatory Visit: Payer: Medicaid Other | Admitting: Physical Therapy

## 2020-03-22 ENCOUNTER — Other Ambulatory Visit: Payer: Self-pay

## 2020-03-22 ENCOUNTER — Encounter: Payer: Self-pay | Admitting: Physical Therapy

## 2020-03-22 DIAGNOSIS — M25571 Pain in right ankle and joints of right foot: Secondary | ICD-10-CM | POA: Diagnosis not present

## 2020-03-22 DIAGNOSIS — R262 Difficulty in walking, not elsewhere classified: Secondary | ICD-10-CM

## 2020-03-22 DIAGNOSIS — M6281 Muscle weakness (generalized): Secondary | ICD-10-CM | POA: Diagnosis not present

## 2020-03-22 DIAGNOSIS — R2689 Other abnormalities of gait and mobility: Secondary | ICD-10-CM

## 2020-03-22 DIAGNOSIS — G8929 Other chronic pain: Secondary | ICD-10-CM | POA: Diagnosis not present

## 2020-03-22 DIAGNOSIS — R600 Localized edema: Secondary | ICD-10-CM

## 2020-03-22 DIAGNOSIS — M25671 Stiffness of right ankle, not elsewhere classified: Secondary | ICD-10-CM | POA: Diagnosis not present

## 2020-03-22 NOTE — Therapy (Signed)
[place], [place], [address] Phone: [phone]   Fax:  [phone]  Physical Therapy Treatment  Patient Details  Name: Shannon Randolph MRN: [id number] Date of Birth: 1991/02/07 Referring Provider (PT): Nicholes Stairs, MD   Encounter Date: 03/22/2020   PT End of Session - 03/22/20 0924    Visit Number 10    Number of Visits 13    Date for PT Re-Evaluation 04/07/20    Authorization Type [place] MEDICAID HEALTHY BLUE    Authorization Time Period 10/11 to 10/28    Authorization - Visit Number 3    Authorization - Number of Visits 8    PT Start Time 0920    PT Stop Time 1000    PT Time Calculation (min) 40 min    Activity Tolerance Patient tolerated treatment well    Behavior During Therapy [facility] for tasks assessed/performed           Past Medical History:  Diagnosis Date  . Abortion   . Bacterial vaginosis   . History of kidney stones   . Infection    UTI, gonorrhea  . No pertinent past medical history     Past Surgical History:  Procedure Laterality Date  . NO PAST SURGERIES    . ORIF ANKLE FRACTURE Right 12/11/2019   Procedure: OPEN REDUCTION INTERNAL FIXATION (ORIF) ANKLE FRACTURE;  Surgeon: Nicholes Stairs, MD;  Location: [place];  Service: Orthopedics;  Laterality: Right;  100 mins    There were no vitals filed for this visit.   Subjective Assessment - 03/22/20 0922    Subjective Still feels foot turning out. No pain .  Recommended she weasr proper shoes for next visit    Currently in Pain? No/denies              [facility] PT Assessment - 03/22/20 0001      AROM   Right Ankle Dorsiflexion 3    Right Ankle Inversion 40    Right Ankle Eversion 30      Strength   Right Ankle Dorsiflexion 5/5    Right Ankle Plantar Flexion 4/5    Right Ankle Inversion 4/5    Right Ankle Eversion 4/5                         [facility] Adult PT Treatment/Exercise - 03/22/20  0001      Ambulation/Gait   Stairs Yes    Stairs Assistance 6: Modified independent (Device/Increase time)    Stair Management Technique No rails;Alternating pattern      Ankle Exercises: Aerobic   Stationary Bike 5 min L3       Ankle Exercises: Standing   SLS static progressed to mod dynamic using Pilates circles    reverse mini lunge, vectors and upper trunk rotation   Rebounder BOSU balance, squats and alternatind step ups, lunges x 10 each LE    Tai Chi heel raise hold with shoulder extension x 15       Ankle Exercises: Stretches   Soleus Stretch 3 reps    Gastroc Stretch 3 reps      Ankle Exercises: Supine   T-Band seated green T band eversion x 20 and inversion x 20 cues needed                   PT Education - 03/22/20 1007    Education Details proper footwear, stairs    Person(s) Educated Patient  Methods Explanation    Comprehension Verbalized understanding            PT Short Term Goals - 02/25/20 1207      PT SHORT TERM GOAL #1   Title Pt will be Ind in an initial HEP. Achieved    Status Achieved      PT SHORT TERM GOAL #2   Title Pt will voice understanding of RICE for pain and swelling management. Achieved    Status Achieved      PT SHORT TERM GOAL #3   Title Assess TUG and 2 min walking test and set LTGs. On-going    Status Achieved             PT Long Term Goals - 03/22/20 0947      PT LONG TERM GOAL #1   Title Pt will report improvement in R ankle pain with daily activities to 4/10 or less    Status Achieved      PT LONG TERM GOAL #2   Title Pt will walk 1000 ft s an AD demonstrating a heel/toe gait pattern and better foot and ankle alignment      PT LONG TERM GOAL #3   Title Pt will be in in a final HEP    Status On-going      PT LONG TERM GOAL #4   Title Pt will be able to negotiate stairs with a normal pattern, holding 1 rail, less hip compensation.      PT LONG TERM GOAL #5   Title Pt will increase ankle strength to  5/5 in all planes to allow stability to walking                 Plan - 03/22/20 0927    Clinical Impression Statement Patient continues to complain of foot turning out with gait.  She has improved ROM and strength but still needs work on single leg stability and efficiency of gait and stairs.  Lack of ankle mobility limits stair negotiation.  Auth time was very short and so this patient will not be able to complete 8 visits in 2 weeks.    PT Treatment/Interventions ADLs/Self Care Home Management;Cryotherapy;Electrical Stimulation;Moist Heat;Iontophoresis 53m/ml Dexamethasone;Gait training;Functional mobility tScientist, forensicTherapeutic activities;Therapeutic exercise;Balance training;Manual techniques;Patient/family education;Passive range of motion;Dry needling;Splinting;Taping;Vasopneumatic Device    PT Next Visit Plan squat, ankle DF step ups, downs; walk fast with sneakers, stairs    PT Home Exercise Plan [id number]. Standing ankle inv/ev; zigzag c L LE support at counter; manual PF stretch, soleus    Consulted and Agree with Plan of Care Patient           Patient will benefit from skilled therapeutic intervention in order to improve the following deficits and impairments:  Abnormal gait, Difficulty walking, Decreased range of motion, Decreased activity tolerance, Decreased balance, Decreased mobility, Decreased strength, Impaired flexibility, Pain  Visit Diagnosis: Localized edema  Difficulty in walking, not elsewhere classified  Muscle weakness (generalized)  Other abnormalities of gait and mobility  Chronic pain of right ankle  Decreased range of motion of right ankle     Problem List Patient Active Problem List   Diagnosis Date Noted  . Screening for human immunodeficiency virus 01/21/2019  . Contraceptive management 01/21/2019  . Abnormal urine odor 01/21/2019  . Elevated transaminase level 01/21/2019  . Encounter for counseling regarding contraception  09/30/2018  . Screening for malignant neoplasm of cervix 09/30/2018  . Possible exposure to STD 09/30/2018  . Allergic reaction 08/09/2017  .  Abnormal Pap smear of cervix 07/04/2016  . Tachycardia   . LBP (low back pain) 10/18/2011  . Bacterial vaginosis 10/16/2011  . Vaginal discharge 12/11/2010  . HIP PAIN, LEFT 02/22/2010  . TOBACCO ABUSE 04/07/2009    Airam Heidecker 03/22/2020, 10:17 AM  [facility] [address] [place], [place], [address] Phone: [phone]   Fax:  [phone]  Name: Ariana Juul MRN: [id number] Date of Birth: [birth year]  Raeford Razor, PT 03/22/20 10:17 AM Phone: [phone] Fax: [phone]

## 2020-03-24 ENCOUNTER — Encounter: Payer: Self-pay | Admitting: Physical Therapy

## 2020-03-24 ENCOUNTER — Ambulatory Visit: Payer: Medicaid Other | Admitting: Physical Therapy

## 2020-03-24 ENCOUNTER — Other Ambulatory Visit: Payer: Self-pay

## 2020-03-24 DIAGNOSIS — R262 Difficulty in walking, not elsewhere classified: Secondary | ICD-10-CM

## 2020-03-24 DIAGNOSIS — G8929 Other chronic pain: Secondary | ICD-10-CM | POA: Diagnosis not present

## 2020-03-24 DIAGNOSIS — M25571 Pain in right ankle and joints of right foot: Secondary | ICD-10-CM | POA: Diagnosis not present

## 2020-03-24 DIAGNOSIS — M6281 Muscle weakness (generalized): Secondary | ICD-10-CM | POA: Diagnosis not present

## 2020-03-24 DIAGNOSIS — R600 Localized edema: Secondary | ICD-10-CM | POA: Diagnosis not present

## 2020-03-24 DIAGNOSIS — R2689 Other abnormalities of gait and mobility: Secondary | ICD-10-CM

## 2020-03-24 DIAGNOSIS — M25671 Stiffness of right ankle, not elsewhere classified: Secondary | ICD-10-CM | POA: Diagnosis not present

## 2020-03-24 NOTE — Therapy (Signed)
[facility] [address] [place], [place], [address] Phone: [phone]   Fax:  [phone]  Physical Therapy Treatment/Renewal  Patient Details  Name: Shannon Randolph MRN: [id number] Date of Birth: [birth year] Referring Provider (PT): Yolonda Kida, MD   Encounter Date: 03/24/2020   PT End of Session - 03/24/20 1141    Visit Number 11    Number of Visits 14    Date for PT Re-Evaluation 04/22/20    Authorization Type [place] MEDICAID HEALTHY BLUE    Authorization Time Period 10/11 to 10/28    Authorization - Visit Number 4    Authorization - Number of Visits 8    PT Start Time 1134    PT Stop Time 1212    PT Time Calculation (min) 38 min    Activity Tolerance Patient tolerated treatment well    Behavior During Therapy [facility] for tasks assessed/performed           Past Medical History:  Diagnosis Date  . Abortion   . Bacterial vaginosis   . History of kidney stones   . Infection    UTI, gonorrhea  . No pertinent past medical history     Past Surgical History:  Procedure Laterality Date  . NO PAST SURGERIES    . ORIF ANKLE FRACTURE Right 12/11/2019   Procedure: OPEN REDUCTION INTERNAL FIXATION (ORIF) ANKLE FRACTURE;  Surgeon: Yolonda Kida, MD;  Location: [facility] OR;  Service: Orthopedics;  Laterality: Right;  100 mins    There were no vitals filed for this visit.   Subjective Assessment - 03/24/20 1138    Subjective Pt arrives in good supportive sneakers.  Bought some dumbbells at [employer].    Currently in Pain? No/denies              [facility] PT Assessment - 03/24/20 0001      AROM   Right Ankle Dorsiflexion 3    Right Ankle Inversion 40    Right Ankle Eversion 30      Strength   Right Hip ABduction 4/5    Left Hip ABduction 4/5    Right Ankle Dorsiflexion 5/5    Right Ankle Plantar Flexion 4/5    Right Ankle Inversion 4/5    Right Ankle Eversion 4/5               [facility] Adult PT  Treatment/Exercise - 03/24/20 0001      Knee/Hip Exercises: Aerobic   Elliptical 6 min L1       Knee/Hip Exercises: Standing   Forward Step Up Right;1 set;20 reps;Hand Hold: 0    Forward Step Up Limitations 10 lbs , 6 inch , 2 sets 2nd set added opposite leg raise     Step Down Right;1 set;20 reps;Hand Hold: 0;Step Height: 6"    Functional Squat 1 set;15 reps    Functional Squat Limitations multiple sets with cues for form, tends to use hips excessively, lower chest     Wall Squat 1 set;10 reps    Wall Squat Limitations then small squats with wall for target holding 6 lbs       Knee/Hip Exercises: Sidelying   Hip ABduction Both;1 set;15 reps      Ankle Exercises: Standing   SLS dynamic standing on balance board     Heel Raises 15 reps   10 lbs    Other Standing Ankle Exercises balance board , added head tunrs and eyes closed       Ankle Exercises:  Stretches   Restaurant manager, fast food 3 reps;30 seconds    Restaurant manager, fast food Limitations bilat.                     PT Short Term Goals - 02/25/20 1207      PT SHORT TERM GOAL #1   Title Pt will be Ind in an initial HEP. Achieved    Status Achieved      PT SHORT TERM GOAL #2   Title Pt will voice understanding of RICE for pain and swelling management. Achieved    Status Achieved      PT SHORT TERM GOAL #3   Title Assess TUG and 2 min walking test and set LTGs. On-going    Status Achieved             PT Long Term Goals - 03/24/20 1535      PT LONG TERM GOAL #1   Title Pt will report improvement in R ankle pain with daily activities to 4/10 or less    Status Achieved    Target Date 04/22/20      PT LONG TERM GOAL #2   Title Pt will walk 1000 ft s an AD demonstrating a heel/toe gait pattern and better foot and ankle alignment    Baseline showed today with proper footwear.    Target Date 04/22/20      PT LONG TERM GOAL #3   Title Pt will be in in a final HEP    Status On-going    Target Date 04/22/20      PT  LONG TERM GOAL #4   Title Pt will be able to negotiate stairs with a normal pattern, holding 1 rail, less hip compensation.    Baseline turns sideways, step to pattern    Status On-going    Target Date 04/22/20      PT LONG TERM GOAL #5   Title Pt will increase ankle strength to 5/5 in all planes to allow stability to walking    Baseline 4-/5 to 4/5    Status On-going    Target Date 04/22/20                 Plan - 03/24/20 1213    Clinical Impression Statement Pt with much improve gait pattern and speed with sneaker.  This was the first time she has worn a sneaker since her surgery.  She cont to have difficulty negotiating stairs and has some weakness in ankle and hip.  She will need about 4 more visits to complete her POC.  Extending cert and MCD auth.    PT Frequency 1x / week    PT Duration 4 weeks    PT Treatment/Interventions ADLs/Self Care Home Management;Cryotherapy;Electrical Stimulation;Moist Heat;Iontophoresis 4mg /ml Dexamethasone;Gait training;Functional mobility training;Stair training;Therapeutic activities;Therapeutic exercise;Balance training;Manual techniques;Patient/family education;Passive range of motion;Dry needling;Splinting;Taping;Vasopneumatic Device    PT Next Visit Plan squat, ankle DF step ups, downs; walk fast with sneakers, stairs    PT Home Exercise Plan [id number]. Standing ankle inv/ev; zigzag c L LE support at counter; manual PF stretch, soleus    Consulted and Agree with Plan of Care Patient           Patient will benefit from skilled therapeutic intervention in order to improve the following deficits and impairments:  Abnormal gait, Difficulty walking, Decreased range of motion, Decreased activity tolerance, Decreased balance, Decreased mobility, Decreased strength, Impaired flexibility, Pain  Visit Diagnosis: Localized edema  Difficulty in walking, not elsewhere classified  Muscle weakness (generalized)  Other abnormalities of gait and  mobility  Chronic pain of right ankle  Decreased range of motion of right ankle     Problem List Patient Active Problem List   Diagnosis Date Noted  . Screening for human immunodeficiency virus 01/21/2019  . Contraceptive management 01/21/2019  . Abnormal urine odor 01/21/2019  . Elevated transaminase level 01/21/2019  . Encounter for counseling regarding contraception 09/30/2018  . Screening for malignant neoplasm of cervix 09/30/2018  . Possible exposure to STD 09/30/2018  . Allergic reaction 08/09/2017  . Abnormal Pap smear of cervix 07/04/2016  . Tachycardia   . LBP (low back pain) 10/18/2011  . Bacterial vaginosis 10/16/2011  . Vaginal discharge 12/11/2010  . HIP PAIN, LEFT 02/22/2010  . TOBACCO ABUSE 04/07/2009    Clarine Elrod 03/24/2020, 3:37 PM  [facility] [address] [place], [place], [address] Phone: [phone]   Fax:  [phone]  Name: Shannon Randolph MRN: [id number] Date of Birth: 15-Oct-1990  Karie Mainland, PT 03/24/20 3:38 PM Phone: [phone] Fax: [phone]   Check all possible CPT codes: 97110- Therapeutic Exercise, [phone]- Neuro Re-education, [phone] - Gait Training, [phone] - Manual Therapy, [phone] - Therapeutic Activities, [phone] - Self Care and [phone] - Physical performance training

## 2020-03-29 ENCOUNTER — Other Ambulatory Visit: Payer: Self-pay

## 2020-03-29 ENCOUNTER — Ambulatory Visit: Payer: Medicaid Other | Attending: Orthopedic Surgery

## 2020-03-29 DIAGNOSIS — R2689 Other abnormalities of gait and mobility: Secondary | ICD-10-CM | POA: Diagnosis not present

## 2020-03-29 DIAGNOSIS — R262 Difficulty in walking, not elsewhere classified: Secondary | ICD-10-CM | POA: Insufficient documentation

## 2020-03-29 DIAGNOSIS — G8929 Other chronic pain: Secondary | ICD-10-CM | POA: Diagnosis not present

## 2020-03-29 DIAGNOSIS — M6281 Muscle weakness (generalized): Secondary | ICD-10-CM | POA: Insufficient documentation

## 2020-03-29 DIAGNOSIS — M25671 Stiffness of right ankle, not elsewhere classified: Secondary | ICD-10-CM | POA: Insufficient documentation

## 2020-03-29 DIAGNOSIS — R600 Localized edema: Secondary | ICD-10-CM

## 2020-03-29 DIAGNOSIS — M25571 Pain in right ankle and joints of right foot: Secondary | ICD-10-CM | POA: Diagnosis not present

## 2020-03-29 NOTE — Therapy (Signed)
[facility]patient [facility] [address] [place], [place], [address] Phone: [phone]   Fax:  [phone]  Physical Therapy Treatment  Patient Details  Name: Shannon Randolph MRN: [id number] Date of Birth: [birth year] Referring Provider (PT): Yolonda Kida, MD   Encounter Date: 03/29/2020   PT End of Session - 03/29/20 1021    Visit Number 12    Number of Visits 14    Date for PT Re-Evaluation 04/22/20    Authorization Type [place] MEDICAID HEALTHY BLUE    Authorization Time Period 10/11 to 10/28    Authorization - Visit Number 4    Authorization - Number of Visits 8    PT Start Time 1017    PT Stop Time 1100    PT Time Calculation (min) 43 min    Activity Tolerance Patient tolerated treatment well    Behavior During Therapy [facility] for tasks assessed/performed           Past Medical History:  Diagnosis Date  . Abortion   . Bacterial vaginosis   . History of kidney stones   . Infection    UTI, gonorrhea  . No pertinent past medical history     Past Surgical History:  Procedure Laterality Date  . NO PAST SURGERIES    . ORIF ANKLE FRACTURE Right 12/11/2019   Procedure: OPEN REDUCTION INTERNAL FIXATION (ORIF) ANKLE FRACTURE;  Surgeon: Yolonda Kida, MD;  Location: [facility] OR;  Service: Orthopedics;  Laterality: Right;  100 mins    There were no vitals filed for this visit.   Subjective Assessment - 03/29/20 1020    Subjective Pt reports she has been wearing shoes to walk and walks much better with them. "Without my shoes, I walk slew footed."    Currently in Pain? No/denies                             [facility] Adult PT Treatment/Exercise - 03/29/20 0001      Knee/Hip Exercises: Aerobic   Elliptical 6 min L1       Knee/Hip Exercises: Standing   Forward Lunges Both;1 set;10 reps    Forward Lunges Limitations slow for stretch c UE support on chair to side    Forward Step Up Right;1 set;20 reps;Hand Hold:  0    Forward Step Up Limitations 10 lbs , 6 inch , 2 sets 2nd set added opposite leg raise     Step Down Right;1 set;20 reps;Hand Hold: 0;Step Height: 6"    Functional Squat 1 set;15 reps    Wall Squat 1 set;10 reps    Wall Squat Limitations 6# 5" holds    Lunge Walking - Round Trips 3 round trips in functional area, slow     Stairs no UE assist 6" steps 2 rounds, 2 at a time 4" steps one round      Ankle Exercises: Stretches   Slant Board Stretch 3 reps;30 seconds    Slant Board Stretch Limitations bilat.       Ankle Exercises: Standing   SLS dynamic standing on balance board     Heel Raises 15 reps   10 lbs    Other Standing Ankle Exercises balance board , added head turns and eyes closed                     PT Short Term Goals - 02/25/20 1207      PT SHORT TERM GOAL #  1   Title Pt will be Ind in an initial HEP. Achieved    Status Achieved      PT SHORT TERM GOAL #2   Title Pt will voice understanding of RICE for pain and swelling management. Achieved    Status Achieved      PT SHORT TERM GOAL #3   Title Assess TUG and 2 min walking test and set LTGs. On-going    Status Achieved             PT Long Term Goals - 03/24/20 1535      PT LONG TERM GOAL #1   Title Pt will report improvement in R ankle pain with daily activities to 4/10 or less    Status Achieved    Target Date 04/22/20      PT LONG TERM GOAL #2   Title Pt will walk 1000 ft s an AD demonstrating a heel/toe gait pattern and better foot and ankle alignment    Baseline showed today with proper footwear.    Target Date 04/22/20      PT LONG TERM GOAL #3   Title Pt will be in in a final HEP    Status On-going    Target Date 04/22/20      PT LONG TERM GOAL #4   Title Pt will be able to negotiate stairs with a normal pattern, holding 1 rail, less hip compensation.    Baseline turns sideways, step to pattern    Status On-going    Target Date 04/22/20      PT LONG TERM GOAL #5   Title Pt will  increase ankle strength to 5/5 in all planes to allow stability to walking    Baseline 4-/5 to 4/5    Status On-going    Target Date 04/22/20                 Plan - 03/29/20 1022    Clinical Impression Statement Pt presents with low rise Uggs today, which reportedly don't feel as good as her sneakers did last time. She is improving in mobility of the ankle evidenced by ability to perform walking lunges, with some continued limitation noted descending stairs, as well as glute weakness in ascending stairs.    PT Frequency 1x / week    PT Duration 4 weeks    PT Treatment/Interventions ADLs/Self Care Home Management;Cryotherapy;Electrical Stimulation;Moist Heat;Iontophoresis 4mg /ml Dexamethasone;Gait training;Functional mobility training;Stair training;Therapeutic activities;Therapeutic exercise;Balance training;Manual techniques;Patient/family education;Passive range of motion;Dry needling;Splinting;Taping;Vasopneumatic Device    PT Next Visit Plan squat, ankle DF step ups, downs; walk fast with sneakers, stairs    PT Home Exercise Plan [id number]. Standing ankle inv/ev; zigzag c L LE support at counter; manual PF stretch, soleus    Consulted and Agree with Plan of Care Patient           Patient will benefit from skilled therapeutic intervention in order to improve the following deficits and impairments:  Abnormal gait, Difficulty walking, Decreased range of motion, Decreased activity tolerance, Decreased balance, Decreased mobility, Decreased strength, Impaired flexibility, Pain  Visit Diagnosis: Localized edema  Difficulty in walking, not elsewhere classified  Muscle weakness (generalized)  Other abnormalities of gait and mobility  Chronic pain of right ankle  Decreased range of motion of right ankle     Problem List Patient Active Problem List   Diagnosis Date Noted  . Screening for human immunodeficiency virus 01/21/2019  . Contraceptive management 01/21/2019  .  Abnormal urine odor 01/21/2019  .  Elevated transaminase level 01/21/2019  . Encounter for counseling regarding contraception 09/30/2018  . Screening for malignant neoplasm of cervix 09/30/2018  . Possible exposure to STD 09/30/2018  . Allergic reaction 08/09/2017  . Abnormal Pap smear of cervix 07/04/2016  . Tachycardia   . LBP (low back pain) 10/18/2011  . Bacterial vaginosis 10/16/2011  . Vaginal discharge 12/11/2010  . HIP PAIN, LEFT 02/22/2010  . TOBACCO ABUSE 04/07/2009    Marcelline Mates, PT, DPT 03/29/2020, 11:03 AM  [facility] [address] [place], [place], [address] Phone: [phone]   Fax:  [phone]  Name: Shannon Randolph MRN: [id number] Date of Birth: Mar 05, 1991

## 2020-04-08 ENCOUNTER — Ambulatory Visit: Payer: Medicaid Other

## 2020-04-08 ENCOUNTER — Telehealth: Payer: Self-pay

## 2020-04-08 NOTE — Telephone Encounter (Signed)
Pt had an appt scheduled for 9:30am today. Called and spoke to pt who states her daughter's birthday is this weekend, so she missed her appointment. Informed pt to give notice and she cannot no show. She does have 2 more appts, which were confirmed and agreed upon by pt.   Shannon Randolph. Corliss Marcus, PT, DPT

## 2020-04-15 ENCOUNTER — Ambulatory Visit: Payer: Medicaid Other | Admitting: Physical Therapy

## 2020-04-15 ENCOUNTER — Other Ambulatory Visit: Payer: Self-pay

## 2020-04-15 ENCOUNTER — Encounter: Payer: Self-pay | Admitting: Physical Therapy

## 2020-04-15 DIAGNOSIS — M25671 Stiffness of right ankle, not elsewhere classified: Secondary | ICD-10-CM

## 2020-04-15 DIAGNOSIS — R2689 Other abnormalities of gait and mobility: Secondary | ICD-10-CM | POA: Diagnosis not present

## 2020-04-15 DIAGNOSIS — G8929 Other chronic pain: Secondary | ICD-10-CM | POA: Diagnosis not present

## 2020-04-15 DIAGNOSIS — M6281 Muscle weakness (generalized): Secondary | ICD-10-CM

## 2020-04-15 DIAGNOSIS — R262 Difficulty in walking, not elsewhere classified: Secondary | ICD-10-CM | POA: Diagnosis not present

## 2020-04-15 DIAGNOSIS — M25571 Pain in right ankle and joints of right foot: Secondary | ICD-10-CM

## 2020-04-15 DIAGNOSIS — R600 Localized edema: Secondary | ICD-10-CM | POA: Diagnosis not present

## 2020-04-15 NOTE — Therapy (Signed)
[place], [place], [address] Phone: [phone]   Fax:  [phone]  Physical Therapy Treatment/Discharge  Patient Details  Name: Shannon Randolph MRN: [id number] Date of Birth: 01-28-1991 Referring Provider (PT): Nicholes Stairs, MD   Encounter Date: 04/15/2020   PT End of Session - 04/15/20 1117    Visit Number 13    Number of Visits 14    Date for PT Re-Evaluation 04/22/20    Authorization Type [place] MEDICAID HEALTHY BLUE    PT Start Time 1050    PT Stop Time 1126    PT Time Calculation (min) 36 min    Activity Tolerance Patient tolerated treatment well    Behavior During Therapy [facility] for tasks assessed/performed           Past Medical History:  Diagnosis Date  . Abortion   . Bacterial vaginosis   . History of kidney stones   . Infection    UTI, gonorrhea  . No pertinent past medical history     Past Surgical History:  Procedure Laterality Date  . NO PAST SURGERIES    . ORIF ANKLE FRACTURE Right 12/11/2019   Procedure: OPEN REDUCTION INTERNAL FIXATION (ORIF) ANKLE FRACTURE;  Surgeon: Nicholes Stairs, MD;  Location: [facility];  Service: Orthopedics;  Laterality: Right;  100 mins    There were no vitals filed for this visit.   Subjective Assessment - 04/15/20 1053    Subjective No pain complaints.  This is her last PT visit.    Currently in Pain? No/denies              [facility] PT Assessment - 04/15/20 0001      AROM   Right Ankle Dorsiflexion 10    Right Ankle Plantar Flexion 40    Right Ankle Inversion 40    Right Ankle Eversion 20      Strength   Right Ankle Dorsiflexion 5/5    Right Ankle Plantar Flexion 4/5    Right Ankle Inversion 5/5    Right Ankle Eversion 4+/5                         [facility] Adult PT Treatment/Exercise - 04/15/20 0001      Ambulation/Gait   Stairs --   12-16 with mod I no rails    Gait Comments quick pace 300 feet       Ankle Exercises: Stretches   Slant Board Stretch 3 reps;30 seconds    Slant Board Stretch Limitations bilat.       Ankle Exercises: Standing   SLS TRX single leg hip hinge     Heel Raises 20 reps    Braiding (Round Trip) SLS static and with head turns, eyes open     Other Standing Ankle Exercises then single leg     Other Standing Ankle Exercises TRX : double leg squats , single leg squats x 20 each       Ankle Exercises: Aerobic   Stationary Bike 6 min L2                   PT Education - 04/15/20 1127    Education Details DC, single leg balance and proprioception, stretch    Person(s) Educated Patient    Methods Explanation;Demonstration    Comprehension Verbalized understanding;Returned demonstration            PT Short Term Goals - 02/25/20 1207  PT SHORT TERM GOAL #1   Title Pt will be Ind in an initial HEP. Achieved    Status Achieved      PT SHORT TERM GOAL #2   Title Pt will voice understanding of RICE for pain and swelling management. Achieved    Status Achieved      PT SHORT TERM GOAL #3   Title Assess TUG and 2 min walking test and set LTGs. On-going    Status Achieved             PT Long Term Goals - 04/15/20 1117      PT LONG TERM GOAL #1   Title Pt will report improvement in R ankle pain with daily activities to 4/10 or less    Status Achieved      PT LONG TERM GOAL #2   Title Pt will walk 1000 ft s an AD demonstrating a heel/toe gait pattern and better foot and ankle alignment    Status Achieved      PT LONG TERM GOAL #3   Title Pt will be in in a final HEP    Status Achieved      PT LONG TERM GOAL #4   Title Pt will be able to negotiate stairs with a normal pattern, holding 1 rail, less hip compensation.    Status Achieved      PT LONG TERM GOAL #5   Title Pt will increase ankle strength to 5/5 in all planes to allow stability to walking    Status Partially Met                 Plan - 04/15/20 1118    Clinical  Impression Statement Pt has met goals, other than strength in PF and Eversion .  She can show static balance on Rt LE to 30 sec but has challenges with head turns or other dynamic balance activities.  She has a normalized gait pattern when she increases her speed. She is discharged from PT.    PT Treatment/Interventions ADLs/Self Care Home Management;Cryotherapy;Electrical Stimulation;Moist Heat;Iontophoresis 66m/ml Dexamethasone;Gait training;Functional mobility tScientist, forensicTherapeutic activities;Therapeutic exercise;Balance training;Manual techniques;Patient/family education;Passive range of motion;Dry needling;Splinting;Taping;Vasopneumatic Device    PT Next Visit Plan DC, NA    PT Home Exercise Plan [id number]. Standing ankle inv/ev; zigzag c L LE support at counter; manual PF stretch, soleus    Consulted and Agree with Plan of Care Patient           Patient will benefit from skilled therapeutic intervention in order to improve the following deficits and impairments:  Abnormal gait, Difficulty walking, Decreased range of motion, Decreased activity tolerance, Decreased balance, Decreased mobility, Decreased strength, Impaired flexibility, Pain  Visit Diagnosis: Difficulty in walking, not elsewhere classified  Localized edema  Muscle weakness (generalized)  Other abnormalities of gait and mobility  Chronic pain of right ankle  Decreased range of motion of right ankle     Problem List Patient Active Problem List   Diagnosis Date Noted  . Screening for human immunodeficiency virus 01/21/2019  . Contraceptive management 01/21/2019  . Abnormal urine odor 01/21/2019  . Elevated transaminase level 01/21/2019  . Encounter for counseling regarding contraception 09/30/2018  . Screening for malignant neoplasm of cervix 09/30/2018  . Possible exposure to STD 09/30/2018  . Allergic reaction 08/09/2017  . Abnormal Pap smear of cervix 07/04/2016  . Tachycardia   . LBP (low back  pain) 10/18/2011  . Bacterial vaginosis 10/16/2011  . Vaginal discharge 12/11/2010  . HIP PAIN,  LEFT 02/22/2010  . TOBACCO ABUSE 04/07/2009    Raechell Singleton 04/15/2020, 11:31 AM  [facility] [address] [place], [place], [address] Phone: [phone]   Fax:  [phone]  Name: Shannon Randolph MRN: [id number] Date of Birth: 1990/09/29

## 2020-04-18 ENCOUNTER — Ambulatory Visit: Payer: Medicaid Other | Admitting: Physical Therapy

## 2020-06-03 ENCOUNTER — Other Ambulatory Visit: Payer: Self-pay

## 2020-06-03 ENCOUNTER — Other Ambulatory Visit (HOSPITAL_COMMUNITY)
Admission: RE | Admit: 2020-06-03 | Discharge: 2020-06-03 | Disposition: A | Discharge status: Home / Self Care | Payer: Medicaid Other | Source: Ambulatory Visit | Attending: Family Medicine | Admitting: Family Medicine

## 2020-06-03 ENCOUNTER — Ambulatory Visit (INDEPENDENT_AMBULATORY_CARE_PROVIDER_SITE_OTHER): Payer: Medicaid Other | Admitting: Family Medicine

## 2020-06-03 ENCOUNTER — Encounter: Payer: Self-pay | Admitting: Family Medicine

## 2020-06-03 VITALS — BP 120/70 | HR 91 | Ht 64.0 in | Wt 155.6 lb

## 2020-06-03 DIAGNOSIS — Z332 Encounter for elective termination of pregnancy: Secondary | ICD-10-CM | POA: Insufficient documentation

## 2020-06-03 DIAGNOSIS — N939 Abnormal uterine and vaginal bleeding, unspecified: Secondary | ICD-10-CM

## 2020-06-03 DIAGNOSIS — Z3009 Encounter for other general counseling and advice on contraception: Secondary | ICD-10-CM

## 2020-06-03 LAB — POCT URINE PREGNANCY: Preg Test, Ur: NEGATIVE

## 2020-06-03 MED ORDER — NORGESTIMATE-ETH ESTRADIOL 0.25-35 MG-MCG PO TABS: 1.0000 | ORAL_TABLET | Freq: Every day | ORAL | 11 refills | Status: DC

## 2020-06-03 NOTE — Patient Instructions (Signed)
It was wonderful to meet you today. Thank you for allowing me to be a part of your care. Below is a short summary of what we discussed at your visit today:  Vaginal spotting Today we did several tests to evaluate your vaginal spotting.  Your urine pregnancy test was negative.  Your vaginal tissue looked normal and had no evidence of infection or irritation.  We collected a vaginal swab to look for gonorrhea, chlamydia, trichomonas, BV, and yeast infections.  While do not expect any of these to come back positive, I want to to be thorough with the work-up of your vaginal spotting.  I have sent a prescription for an oral contraceptive pill (birth control pill) to your pharmacy.  This should help to regulate your menses and get you back to normal menses cycle.  Let us try several months on this and reevaluate if is not working.  Just like birth control pills that you may have taken in the past, you will take this every day for 28 days (3 weeks) and have one week without them during which you should have your menses.  This pill will work best if you take it at the same time every day, within the same hour window.   If you have any questions or concerns, please do not hesitate to contact us via phone or MyChart message.   Fayette Pho, MD

## 2020-06-03 NOTE — Progress Notes (Signed)
    SUBJECTIVE:   CHIEF COMPLAINT / HPI:   Vaginal spotting s/p medical abortion Patient reports obtaining a medical elective abortion around [redacted] weeks gestation the day after Thanksgiving.  She had a typical course, passing large clots with bleeding and cramping for about a month.  However, for the subsequent 2 months she has been continuously spotting.  Not enough blood to need a pad or tampon but it is present every time she urinates or wipes.  Also experiencing some left-sided mild cramping, sharp in nature, congruent with her normal menstrual cramping.  Denies any abnormal discharge or vaginal odor.  No dysuria, pyuria, abdominal pain, vaginal pain.  No new rashes or lesions.  Denies systemic symptoms such as fever, chills, nausea, vomiting, diarrhea, constipation.  Only systemic symptom is that she is "sleepy a lot".  However, not dizzy, lightheaded, orthostatic, exceptionally fatigued, or weak.  Prior to this abortion, her menses were regular: Monthly, lasting 3 to 5 days.  Last menses estimated to be September 12.  PERTINENT  PMH / PSH: History of medical elective abortion, history of abnormal Pap smear, tachycardia, low back pain, vaginal discharge, left hip pain, right ankle fracture  OBJECTIVE:   BP 120/70   Pulse 91   Ht 5\' 4"  (1.626 m)   Wt 155 lb 9.6 oz (70.6 kg)   SpO2 99%   BMI 26.71 kg/m    Physical Exam General: Awake, alert, oriented, no acute distress Respiratory: Normal work of breathing, no respiratory distress Neuro: Cranial nerves II through X grossly intact, able to move all extremities spontaneously Vulva: Normal appearing vulva without rashes, lesions, or deformities Vagina: Pale pink rugated vaginal tissue without obvious lesions, physiologic discharge of whitish color, cervix without lesion or overt tenderness with swab, bright red blood in vaginal vault consistent with current vaginal spotting   ASSESSMENT/PLAN:   Vaginal spotting Likely irregular menses  s/p medical elective abortion 04/22/2020.  Low suspicion for STD, PID, incomplete abortion.  Urine pregnancy POC test negative today.  Cervicovaginal swab obtained today to screen for GC, chlamydia, BV, trichomoniasis, candidiasis.  Sent prescription for Sprintec, will trial for 3 months.  Contraceptive management Not currently on contraception.  Chart indicates OCP stopped July 2021 due to patient preference, however patient indicates she never started on these and never got them from the pharmacy.  At today's visit, she says that she does not want to become pregnant in the next couple years.  We broadly discussed options to achieve this goal, including long-term tools such as Nexplanon and IUDs.  Encouraged her to make an appointment after Sprintec trial to discuss these.    August 2021, MD [facility]

## 2020-06-03 NOTE — Assessment & Plan Note (Signed)
Not currently on contraception.  Chart indicates OCP stopped July 2021 due to patient preference, however patient indicates she never started on these and never got them from the pharmacy.  At today's visit, she says that she does not want to become pregnant in the next couple years.  We broadly discussed options to achieve this goal, including long-term tools such as Nexplanon and IUDs.  Encouraged her to make an appointment after Sprintec trial to discuss these.

## 2020-06-03 NOTE — Assessment & Plan Note (Signed)
Likely irregular menses s/p medical elective abortion 04/22/2020.  Low suspicion for STD, PID, incomplete abortion.  Urine pregnancy POC test negative today.  Cervicovaginal swab obtained today to screen for GC, chlamydia, BV, trichomoniasis, candidiasis.  Sent prescription for Sprintec, will trial for 3 months.

## 2020-06-06 ENCOUNTER — Telehealth: Payer: Self-pay | Admitting: Family Medicine

## 2020-06-06 DIAGNOSIS — N760 Acute vaginitis: Secondary | ICD-10-CM

## 2020-06-06 DIAGNOSIS — B9689 Other specified bacterial agents as the cause of diseases classified elsewhere: Secondary | ICD-10-CM

## 2020-06-06 LAB — CERVICOVAGINAL ANCILLARY ONLY
Bacterial Vaginitis (gardnerella): POSITIVE — AB
Candida Glabrata: NEGATIVE
Candida Vaginitis: NEGATIVE
Chlamydia: NEGATIVE
Comment: NEGATIVE
Comment: NEGATIVE
Comment: NEGATIVE
Comment: NEGATIVE
Comment: NEGATIVE
Comment: NORMAL
Neisseria Gonorrhea: NEGATIVE
Trichomonas: NEGATIVE

## 2020-06-06 MED ORDER — METRONIDAZOLE 0.75 % VA GEL: 1.0000 | Freq: Every day | VAGINAL | 0 refills | Status: DC

## 2020-06-06 NOTE — Telephone Encounter (Signed)
Called Ms. Schimming to discuss her cervicovaginal swab results, positive for BV. After some discussion, she opted for the metronidazole gel. I will send this to her pharmacy.   Fayette Pho, MD

## 2020-06-29 ENCOUNTER — Ambulatory Visit (INDEPENDENT_AMBULATORY_CARE_PROVIDER_SITE_OTHER): Payer: Medicaid Other | Admitting: Student in an Organized Health Care Education/Training Program

## 2020-06-29 ENCOUNTER — Other Ambulatory Visit (HOSPITAL_COMMUNITY)
Admission: RE | Admit: 2020-06-29 | Discharge: 2020-06-29 | Disposition: A | Discharge status: Home / Self Care | Payer: Medicaid Other | Source: Ambulatory Visit | Attending: Family Medicine | Admitting: Family Medicine

## 2020-06-29 ENCOUNTER — Other Ambulatory Visit: Payer: Self-pay

## 2020-06-29 VITALS — BP 120/70 | HR 106 | Ht 63.0 in | Wt 159.8 lb

## 2020-06-29 DIAGNOSIS — N760 Acute vaginitis: Secondary | ICD-10-CM | POA: Diagnosis not present

## 2020-06-29 DIAGNOSIS — Z124 Encounter for screening for malignant neoplasm of cervix: Secondary | ICD-10-CM | POA: Diagnosis not present

## 2020-06-29 DIAGNOSIS — N898 Other specified noninflammatory disorders of vagina: Secondary | ICD-10-CM | POA: Insufficient documentation

## 2020-06-29 DIAGNOSIS — B9689 Other specified bacterial agents as the cause of diseases classified elsewhere: Secondary | ICD-10-CM | POA: Diagnosis not present

## 2020-06-29 DIAGNOSIS — Z30013 Encounter for initial prescription of injectable contraceptive: Secondary | ICD-10-CM

## 2020-06-29 LAB — POCT WET PREP (WET MOUNT)
Clue Cells Wet Prep Whiff POC: NEGATIVE
Trichomonas Wet Prep HPF POC: ABSENT

## 2020-06-29 LAB — POCT URINE PREGNANCY: Preg Test, Ur: NEGATIVE

## 2020-06-29 LAB — POCT URINALYSIS DIP (MANUAL ENTRY)
Bilirubin, UA: NEGATIVE
Blood, UA: NEGATIVE
Glucose, UA: NEGATIVE mg/dL
Ketones, POC UA: NEGATIVE mg/dL
Nitrite, UA: NEGATIVE
Protein Ur, POC: NEGATIVE mg/dL
Spec Grav, UA: 1.02 (ref 1.010–1.025)
Urobilinogen, UA: 1 E.U./dL
pH, UA: 7 (ref 5.0–8.0)

## 2020-06-29 LAB — POCT UA - MICROSCOPIC ONLY

## 2020-06-29 MED ORDER — MEDROXYPROGESTERONE ACETATE 150 MG/ML IM SUSY
150.0000 mg | PREFILLED_SYRINGE | Freq: Once | INTRAMUSCULAR | Status: AC
  Administered 2020-06-29: 150 mg via INTRAMUSCULAR

## 2020-06-29 NOTE — Assessment & Plan Note (Signed)
Repeat negative urine pregnancy Depo shot provided today per patient request

## 2020-06-29 NOTE — Progress Notes (Signed)
Patient given Depo-Provera injection in RUOQ.  Patient tolerated it well. Patient given reminder card of April 20- Sep 28, 2020.  Glennie Hawk, CMA

## 2020-06-29 NOTE — Progress Notes (Signed)
   SUBJECTIVE:   CHIEF COMPLAINT / HPI: vaginal irritation and discharge  Vaginal irritation: Patient was seen and treated for BV 1/10.  She completed 5 days of MetroGel treatment and felt that her symptoms initially improved however, she continued to use the gel for longer than prescribed and then felt different symptoms return. Her main complaint is pain with insertion in the vaginal canal. Denies any intercourse since her last visit. Denies using anything internally to wash her vagina. She does wash the exterior vagina with soap. Complains of chunky white vaginal discharge.  Not taking prescribed birth control pills since the vaginal spotting has stopped.  She is interested in getting a Depo shot today.  OBJECTIVE:   BP 120/70   Pulse (!) 106   Ht 5\' 3"  (1.6 m)   Wt 159 lb 12.8 oz (72.5 kg)   SpO2 99%   BMI 28.31 kg/m   Chaperone present for sensitive exam General: NAD, pleasant, able to participate in exam Pelvic: neg for external genitalia lesions. Vaginal canal is erythematous and has thick and chunky grey discharge in combination with a white mucoid discharge. Extremities: no edema. WWP. Skin: warm and dry, no rashes noted Neuro: alert and oriented, no focal deficits Psych: Normal affect and mood  ASSESSMENT/PLAN:   Contraception management Repeat negative urine pregnancy Depo shot provided today per patient request   Vaginal irritation Based on history and exam, vaginal irritation likely accidentally self-inflicted. Recommended to avoid inserting anything into vagina for a period of time to allow to heal and then never insert scented or harsh chemicals.  Recommended gentle external cleansers.  Wet prep, gc/chlamydia collected today as well as urinalysis If needs further BV treatment, would likely need to use clindamycin as patient is intolerant to topical and oral metronidazole.   Screening for malignant neoplasm of cervix Will need pap when able to better tolerate  exam.  Patient made aware  , DO [facility]

## 2020-06-29 NOTE — Patient Instructions (Signed)
It was a pleasure to see you today!  To summarize our discussion for this visit:  We are checking for repeat infection today and will treat as needed.  You are also receiving a depo shot  Please schedule an appointment for a follow up pap smear when you are feeling better.   Some additional health maintenance measures we should update are: Health Maintenance Due  Topic Date Due  . Hepatitis C Screening  Never done  . COVID-19 Vaccine (1) Never done  . INFLUENZA VACCINE  12/27/2019  . PAP SMEAR-Modifier  06/08/2020  .    Call the clinic at [phone] if your symptoms worsen or you have any concerns.   Thank you for allowing me to take part in your care,  Dr. Jamelle Rushing

## 2020-06-29 NOTE — Assessment & Plan Note (Signed)
Will need pap when able to better tolerate exam.  Patient made aware

## 2020-06-29 NOTE — Assessment & Plan Note (Addendum)
Based on history and exam, vaginal irritation likely accidentally self-inflicted. Recommended to avoid inserting anything into vagina for a period of time to allow to heal and then never insert scented or harsh chemicals.  Recommended gentle external cleansers.  Wet prep, gc/chlamydia collected today as well as urinalysis If needs further BV treatment, would likely need to use clindamycin as patient is intolerant to topical and oral metronidazole.

## 2020-06-30 LAB — CERVICOVAGINAL ANCILLARY ONLY
Chlamydia: NEGATIVE
Comment: NEGATIVE
Comment: NORMAL
Neisseria Gonorrhea: NEGATIVE

## 2020-07-01 ENCOUNTER — Other Ambulatory Visit: Payer: Self-pay | Admitting: Student in an Organized Health Care Education/Training Program

## 2020-07-01 MED ORDER — FLUCONAZOLE 150 MG PO TABS
150.0000 mg | ORAL_TABLET | Freq: Once | ORAL | 0 refills | Status: AC
Start: 2020-07-01 — End: 2020-07-01

## 2020-09-20 ENCOUNTER — Telehealth: Payer: Self-pay

## 2020-09-20 ENCOUNTER — Ambulatory Visit (INDEPENDENT_AMBULATORY_CARE_PROVIDER_SITE_OTHER): Payer: Medicaid Other

## 2020-09-20 ENCOUNTER — Other Ambulatory Visit: Payer: Self-pay | Admitting: Family Medicine

## 2020-09-20 ENCOUNTER — Other Ambulatory Visit: Payer: Self-pay

## 2020-09-20 DIAGNOSIS — L659 Nonscarring hair loss, unspecified: Secondary | ICD-10-CM

## 2020-09-20 DIAGNOSIS — Z3042 Encounter for surveillance of injectable contraceptive: Secondary | ICD-10-CM

## 2020-09-20 MED ORDER — MEDROXYPROGESTERONE ACETATE 150 MG/ML IM SUSP
150.0000 mg | INTRAMUSCULAR | Status: DC
  Administered 2020-09-20: 150 mg via INTRAMUSCULAR

## 2020-09-20 NOTE — Telephone Encounter (Signed)
Patient present to nurse clinic for depo injection. During this visit, patient requests referral for Dermatologist. Patient was previously referred in 2016, however, would need a new referral. Patient is requesting referral to address hair loss and possible alopecia.   Please advise if patient can receive referral or if you would like to see her in office to follow up on this concern.   Veronda Prude, RN

## 2020-09-20 NOTE — Progress Notes (Signed)
Patient here today for Depo Provera injection and is within her dates.    Last contraceptive appt was 06/2020  Depo given in LUOQ today.  Site unremarkable & patient tolerated injection.    Next injection due 7/12-7/26.  Reminder card given.    Veronda Prude, RN

## 2020-10-31 DIAGNOSIS — L659 Nonscarring hair loss, unspecified: Secondary | ICD-10-CM | POA: Diagnosis not present

## 2021-03-10 DIAGNOSIS — L500 Allergic urticaria: Secondary | ICD-10-CM | POA: Diagnosis not present

## 2021-03-10 DIAGNOSIS — L253 Unspecified contact dermatitis due to other chemical products: Secondary | ICD-10-CM | POA: Diagnosis not present

## 2021-03-13 ENCOUNTER — Telehealth: Payer: Self-pay

## 2021-03-13 NOTE — Telephone Encounter (Signed)
Transition Care Management Follow-up Telephone Call Date of discharge and from where: 03/10/2021-[facility]  How have you been since you were released from the hospital? Patient stated she is doing fine. Any questions or concerns? No  Items Reviewed: Did the pt receive and understand the discharge instructions provided? Yes  Medications obtained and verified?  No medication given at discharge  Other? No  Any new allergies since your discharge? No  Dietary orders reviewed? No Do you have support at home? Yes   Home Care and Equipment/Supplies: Were home health services ordered? not applicable If so, what is the name of the agency? N/A  Has the agency set up a time to come to the patient's home? not applicable Were any new equipment or medical supplies ordered?  No What is the name of the medical supply agency? N/A Were you able to get the supplies/equipment? not applicable Do you have any questions related to the use of the equipment or supplies? No  Functional Questionnaire: (I = Independent and D = Dependent) ADLs: I  Bathing/Dressing- I  Meal Prep- I  Eating- I  Maintaining continence- I  Transferring/Ambulation- I  Managing Meds- I  Follow up appointments reviewed:  PCP Hospital f/u appt confirmed? No   Specialist Hospital f/u appt confirmed? No   Are transportation arrangements needed? No  If their condition worsens, is the pt aware to call PCP or go to the Emergency Dept.? Yes Was the patient provided with contact information for the PCP's office or ED? Yes Was to pt encouraged to call back with questions or concerns? Yes

## 2021-03-14 ENCOUNTER — Other Ambulatory Visit: Payer: Self-pay

## 2021-03-14 ENCOUNTER — Ambulatory Visit (INDEPENDENT_AMBULATORY_CARE_PROVIDER_SITE_OTHER): Payer: Medicaid Other | Admitting: Family Medicine

## 2021-03-14 ENCOUNTER — Encounter: Payer: Self-pay | Admitting: Family Medicine

## 2021-03-14 ENCOUNTER — Other Ambulatory Visit (HOSPITAL_COMMUNITY)
Admission: RE | Admit: 2021-03-14 | Discharge: 2021-03-14 | Disposition: A | Discharge status: Home / Self Care | Payer: Medicaid Other | Source: Ambulatory Visit | Attending: Family Medicine | Admitting: Family Medicine

## 2021-03-14 VITALS — BP 120/72 | HR 79 | Ht 63.0 in | Wt 172.6 lb

## 2021-03-14 DIAGNOSIS — Z113 Encounter for screening for infections with a predominantly sexual mode of transmission: Secondary | ICD-10-CM | POA: Diagnosis not present

## 2021-03-14 DIAGNOSIS — Z124 Encounter for screening for malignant neoplasm of cervix: Secondary | ICD-10-CM | POA: Insufficient documentation

## 2021-03-14 DIAGNOSIS — N898 Other specified noninflammatory disorders of vagina: Secondary | ICD-10-CM

## 2021-03-14 DIAGNOSIS — R87612 Low grade squamous intraepithelial lesion on cytologic smear of cervix (LGSIL): Secondary | ICD-10-CM

## 2021-03-14 DIAGNOSIS — Z3009 Encounter for other general counseling and advice on contraception: Secondary | ICD-10-CM | POA: Diagnosis not present

## 2021-03-14 DIAGNOSIS — F17200 Nicotine dependence, unspecified, uncomplicated: Secondary | ICD-10-CM

## 2021-03-14 DIAGNOSIS — B9689 Other specified bacterial agents as the cause of diseases classified elsewhere: Secondary | ICD-10-CM | POA: Diagnosis not present

## 2021-03-14 DIAGNOSIS — Z202 Contact with and (suspected) exposure to infections with a predominantly sexual mode of transmission: Secondary | ICD-10-CM

## 2021-03-14 DIAGNOSIS — N760 Acute vaginitis: Secondary | ICD-10-CM | POA: Diagnosis not present

## 2021-03-14 LAB — POCT WET PREP (WET MOUNT)
Clue Cells Wet Prep Whiff POC: POSITIVE
Trichomonas Wet Prep HPF POC: ABSENT

## 2021-03-14 MED ORDER — NORGESTIMATE-ETH ESTRADIOL 0.25-35 MG-MCG PO TABS: 1.0000 | ORAL_TABLET | Freq: Every day | ORAL | 11 refills | Status: DC

## 2021-03-14 MED ORDER — METRONIDAZOLE 500 MG PO TABS: 500.0000 mg | ORAL_TABLET | Freq: Two times a day (BID) | ORAL | 0 refills | Status: AC

## 2021-03-14 NOTE — Patient Instructions (Signed)
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  That is why I repeated it after only one year. I will call with results. Since you are on your period now, we can start the birth control pills right away.

## 2021-03-15 ENCOUNTER — Encounter: Payer: Self-pay | Admitting: Family Medicine

## 2021-03-15 LAB — CYTOLOGY - PAP
Chlamydia: NEGATIVE
Comment: NEGATIVE
Comment: NEGATIVE
Comment: NORMAL
High risk HPV: POSITIVE — AB
Neisseria Gonorrhea: NEGATIVE

## 2021-03-15 NOTE — Assessment & Plan Note (Signed)
Wet prep confirms BV.  Called with results and begun flagy.

## 2021-03-15 NOTE — Assessment & Plan Note (Signed)
Start BCP now since on menses, no need for preg test. Made aware combined OCPs are contraindicated at age 30 if still smoking.

## 2021-03-15 NOTE — Assessment & Plan Note (Signed)
Check GC/Chlamydia per pateitn request.

## 2021-03-15 NOTE — Assessment & Plan Note (Signed)
Advised to quit.  No combined OCPs starting age 30.

## 2021-03-15 NOTE — Assessment & Plan Note (Addendum)
Patient states she was unaware of previous abnormal pap.  Repeat pap taken today.   Pap shows LSIL and +HPV again.  Plugged results into APP and recommend colposcopy. Patient informed.

## 2021-03-15 NOTE — Progress Notes (Signed)
    SUBJECTIVE:   CHIEF COMPLAINT / HPI:   The simple complaint of possible BV turned into several issues.   Possible BV.  Scant discharge and odor.  She has had recurrent BV.  No recent antibiotics.  Does not feel as if she is at particular risk for STD.  Wanted to do self wet prep until we realized that she needed PAP. Needs pap.  Was unaware of abnormal pap 1/21 (LSIL and high risk HPV).  Knowing she needs pap today, wants screen for GC/Chlamydia. Needs contraception.  She is smoker but under 35.  Wants BCPs which she has previously used with success.  No hx of DVT.  Currently on menses.      OBJECTIVE:   BP 120/72   Pulse 79   Ht 5\' 3"  (1.6 m)   Wt 172 lb 9.6 oz (78.3 kg)   LMP 02/28/2021   SpO2 99%   BMI 30.57 kg/m   Abd benign Pelvic blood in vault after swabbing clear, blood is from os.  Normal appearing cervix.  Pap taken.  Non tender bimanual.  ASSESSMENT/PLAN:   Birth control counseling Start BCP now since on menses, no need for preg test. Made aware combined OCPs are contraindicated at age 30 if still smoking.    Bacterial vaginosis Wet prep confirms BV.  Called with results and begun flagy.  Possible exposure to STD Check GC/Chlamydia per pateitn request.  TOBACCO ABUSE Advised to quit.  No combined OCPs starting age 30.  Abnormal Pap smear of cervix Patient states she was unaware of previous abnormal pap.  Repeat pap taken today.       29, MD [facility]

## 2021-03-20 ENCOUNTER — Telehealth: Payer: Self-pay

## 2021-03-20 NOTE — Telephone Encounter (Signed)
Called pt. No answer, mailbox is full. If pt calls, please schedule her an appt in [facility]. See message from Dr. Leveda Anna below  Shannon Randolph, CMA  Hensel, Santiago Bumpers, MD  P [facility] Please call patient with an appointment for colposcopy by [place].  She is aware of her second abnormal pap and is expecting the call.

## 2021-03-24 DIAGNOSIS — L234 Allergic contact dermatitis due to dyes: Secondary | ICD-10-CM | POA: Diagnosis not present

## 2021-03-24 DIAGNOSIS — R21 Rash and other nonspecific skin eruption: Secondary | ICD-10-CM | POA: Diagnosis not present

## 2021-03-27 ENCOUNTER — Telehealth: Payer: Self-pay

## 2021-03-27 NOTE — Telephone Encounter (Addendum)
Transition Care Management Unsuccessful Follow-up Telephone Call  Date of discharge and from where:  03/24/2021-[facility]  Attempts:  1st Attempt  Reason for unsuccessful TCM follow-up call:  Voice mail full

## 2021-03-28 NOTE — Telephone Encounter (Signed)
Transition Care Management Unsuccessful Follow-up Telephone Call  Date of discharge and from where:  03/24/2021 from [facility]  Attempts:  2nd Attempt  Reason for unsuccessful TCM follow-up call:  Voice mail full

## 2021-03-29 NOTE — Telephone Encounter (Signed)
Transition Care Management Follow-up Telephone Call Date of discharge and from where: 03/24/2021 from [facility] How have you been since you were released from the hospital? Pt states that she is feeling better from the allergic contact dermatitis she was seen for. Pt states that she is having sx of the flu now.  Any questions or concerns? No  Items Reviewed: Did the pt receive and understand the discharge instructions provided? Yes  Medications obtained and verified? Yes  Other? No  Any new allergies since your discharge? No  Dietary orders reviewed? No Do you have support at home? Yes   Functional Questionnaire: (I = Independent and D = Dependent) ADLs: I  Bathing/Dressing- I  Meal Prep- I  Eating- I  Maintaining continence- I  Transferring/Ambulation- I  Managing Meds- I   Follow up appointments reviewed:  PCP Hospital f/u appt confirmed? No   Specialist Hospital f/u appt confirmed? No   Are transportation arrangements needed? No  If their condition worsens, is the pt aware to call PCP or go to the Emergency Dept.? Yes Was the patient provided with contact information for the PCP's office or ED? Yes Was to pt encouraged to call back with questions or concerns? Yes

## 2021-04-25 NOTE — Telephone Encounter (Signed)
Called pt. Unable to leave a message. Letter mailed. Sunday Spillers, CMA

## 2021-05-05 ENCOUNTER — Ambulatory Visit (INDEPENDENT_AMBULATORY_CARE_PROVIDER_SITE_OTHER): Payer: Medicaid Other

## 2021-05-05 ENCOUNTER — Other Ambulatory Visit: Payer: Self-pay

## 2021-05-05 DIAGNOSIS — Z111 Encounter for screening for respiratory tuberculosis: Secondary | ICD-10-CM | POA: Diagnosis not present

## 2021-05-05 NOTE — Progress Notes (Signed)
Patient is here for a PPD placement.  PPD placed in left forearm @ 3:05 pm.  Patient will return 05/08/21 to have PPD read.   Patient provided with PPD form to be completed on Monday. Forms will be in RN office.   Veronda Prude, RN

## 2021-05-08 ENCOUNTER — Encounter: Payer: Self-pay | Admitting: Family Medicine

## 2021-05-08 ENCOUNTER — Ambulatory Visit (INDEPENDENT_AMBULATORY_CARE_PROVIDER_SITE_OTHER): Payer: Medicaid Other

## 2021-05-08 ENCOUNTER — Other Ambulatory Visit: Payer: Self-pay

## 2021-05-08 DIAGNOSIS — Z111 Encounter for screening for respiratory tuberculosis: Secondary | ICD-10-CM

## 2021-05-08 LAB — TB SKIN TEST
Induration: 0 mm
TB Skin Test: NEGATIVE

## 2021-05-08 NOTE — Progress Notes (Signed)
Patient is here for a PPD read.  It was placed on 05/05/2021 in the left forearm @ 3:05 pm.    PPD RESULTS:  Result: negative Induration: 0 mm  Letter created and given to patient for documentation purposes. Veronda Prude, RN

## 2021-05-08 NOTE — Telephone Encounter (Signed)
Pt coming in to have PPD read in nurse clinic. Spoke to [place] who will talk to pt about scheduling the colposcopy. Sunday Spillers, CMA

## 2021-05-11 ENCOUNTER — Ambulatory Visit: Payer: Medicaid Other

## 2021-08-20 IMAGING — RF DG ANKLE COMPLETE 3+V*R*
1 series · 4 of 4 positions shown · non-contrast
Comparison: 11/27/2019

CLINICAL DATA: ORIF trimalleolar fracture

EXAM:
RIGHT ANKLE - COMPLETE 3+ VIEW; DG C-ARM 1-60 MIN

[Series 1: run · 4 of 4 slices shown]
[im 1/4]
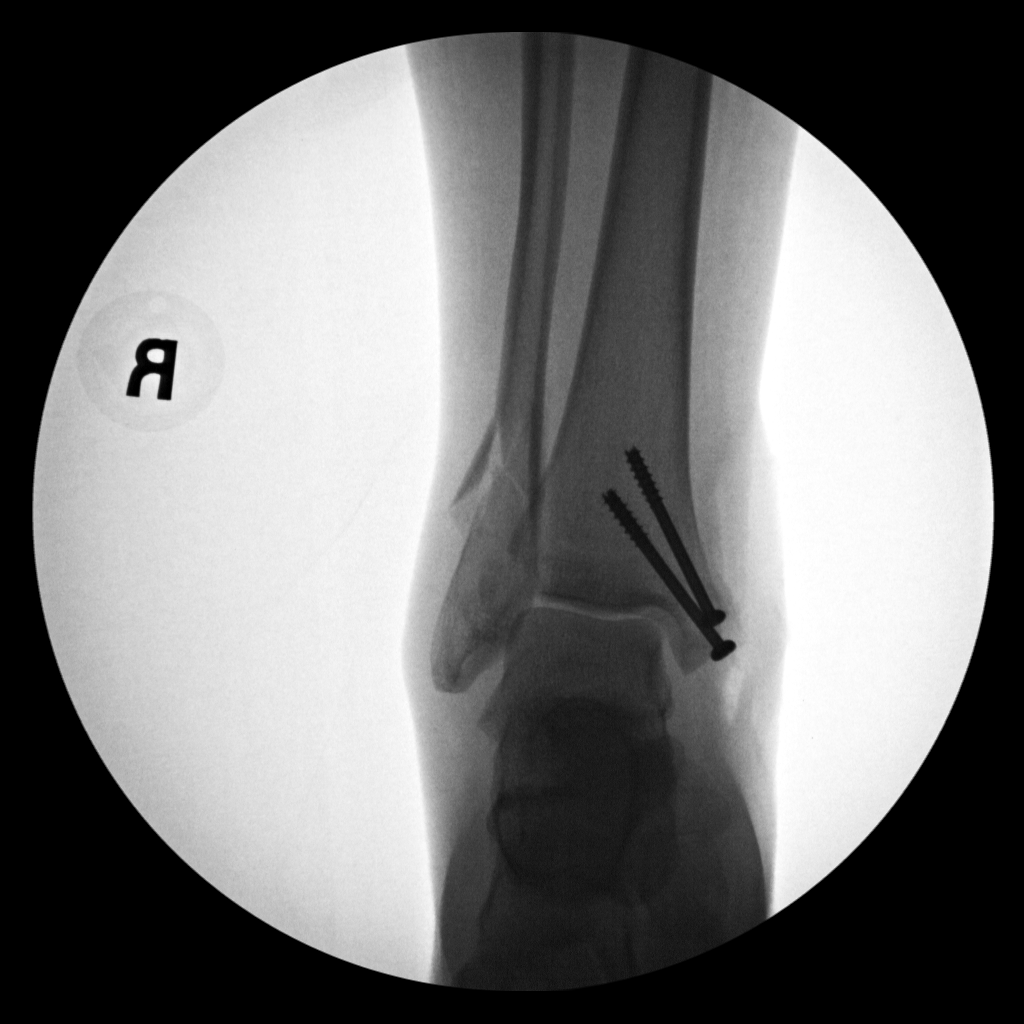
[im 2/4]
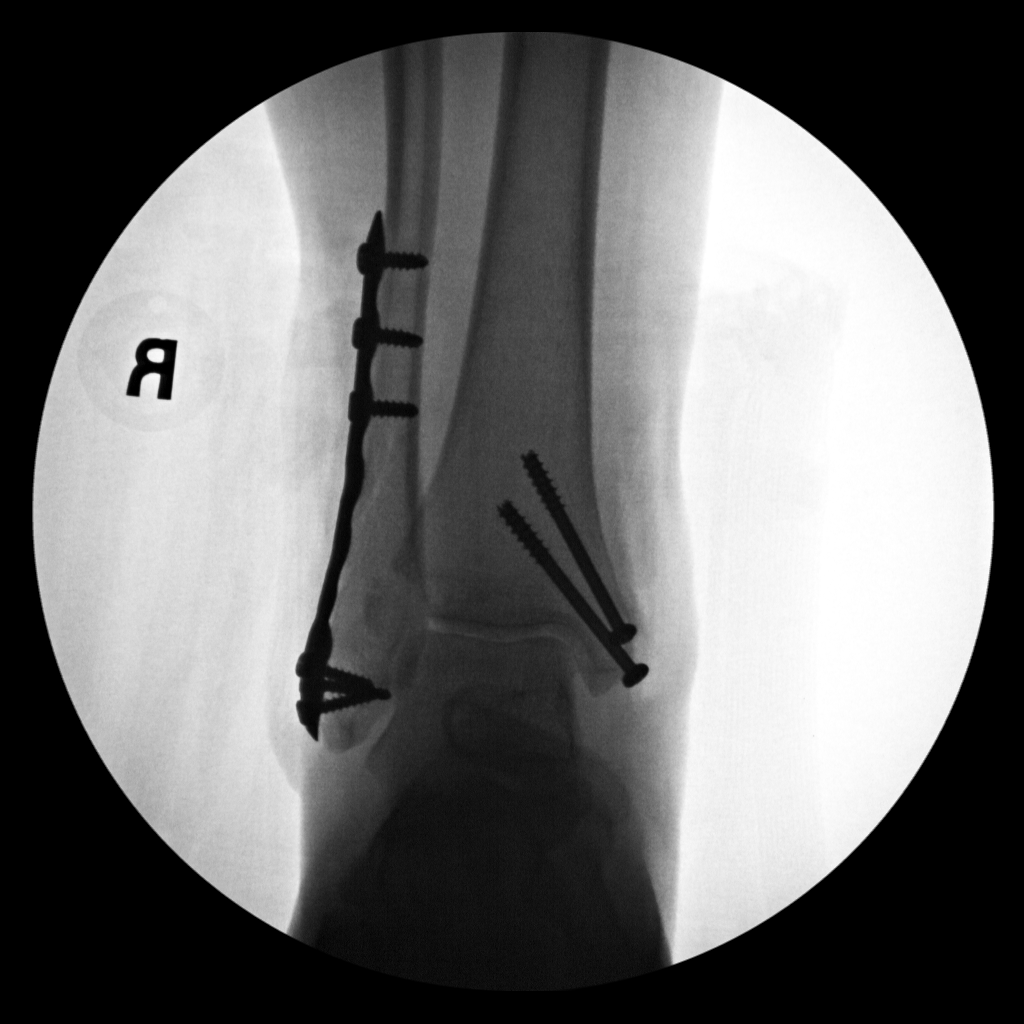
[im 3/4]
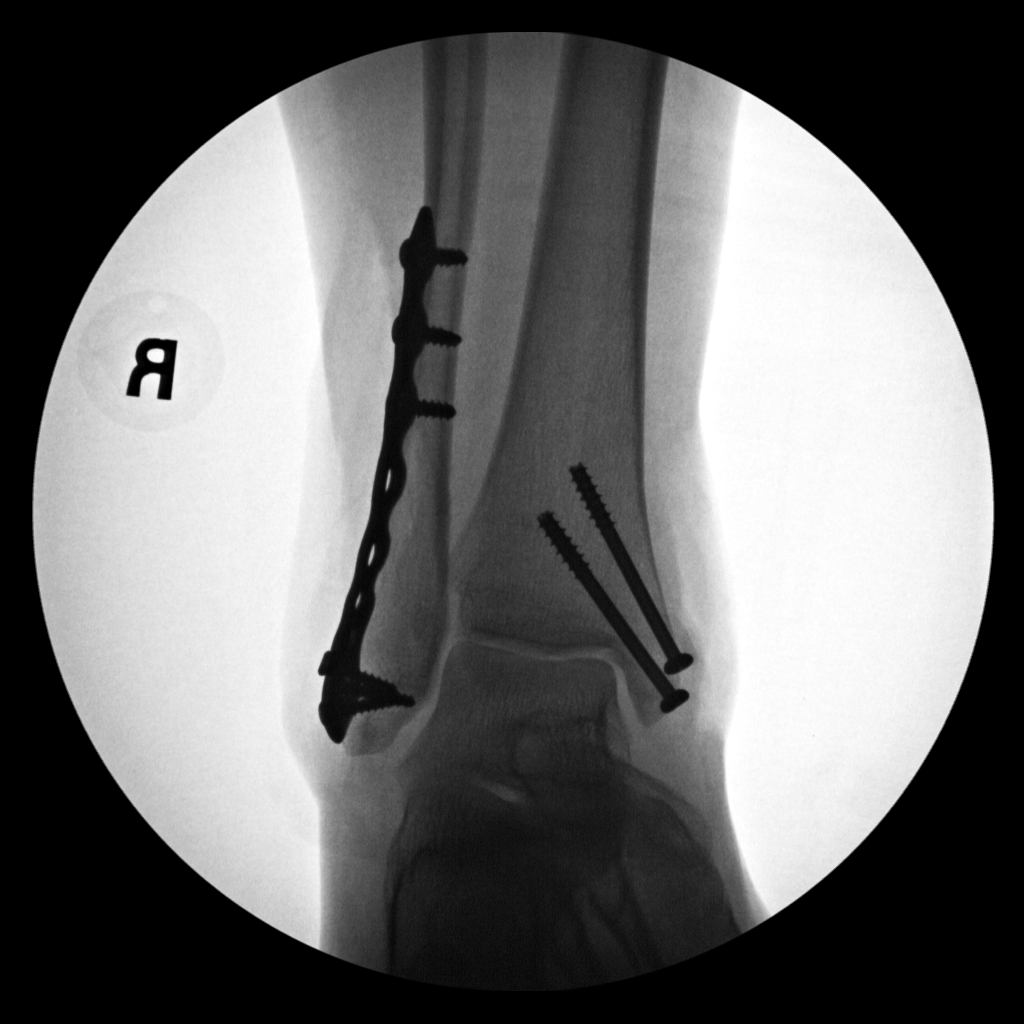
[im 4/4]
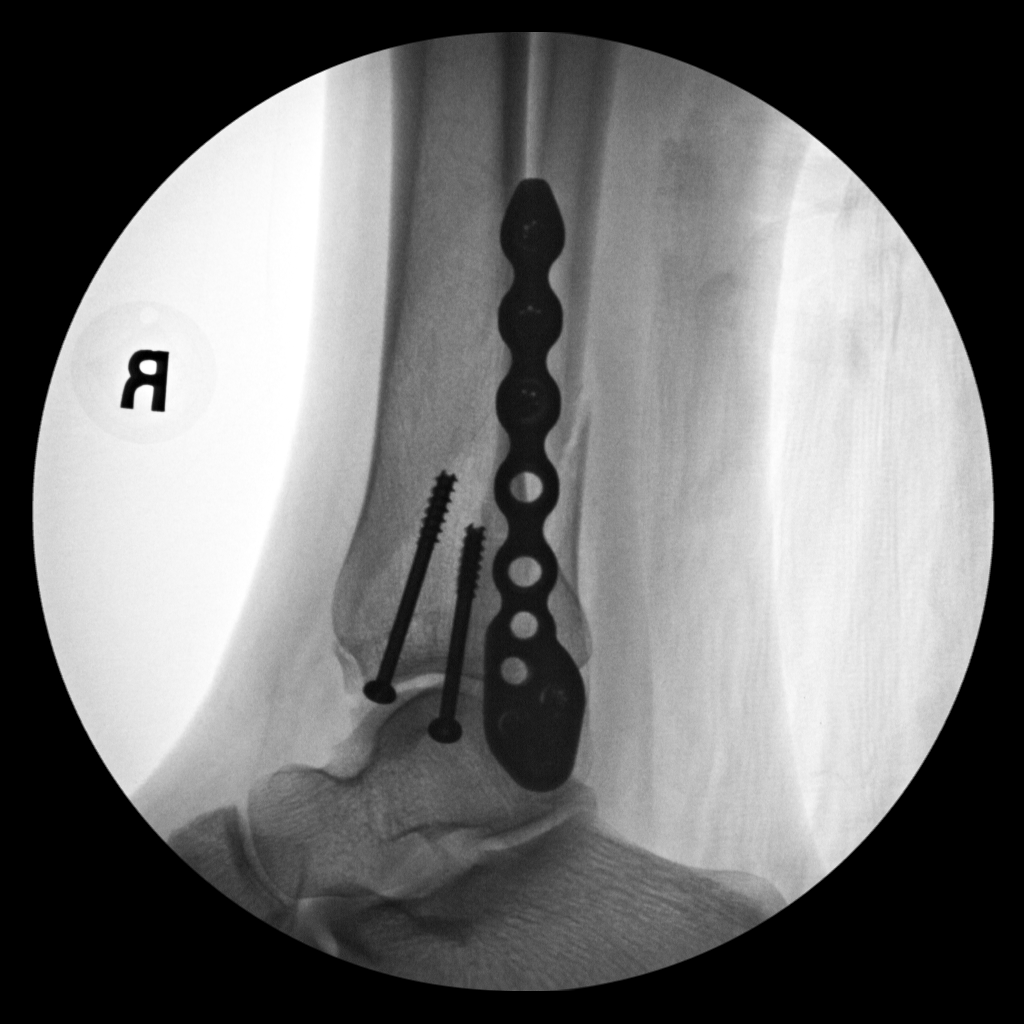

[4 of 4 positions shown; findings below may reference images not displayed]

FINDINGS: Four fluoroscopic images are obtained during the performance of the
procedure and are provided for interpretation only. Plate and screw
fixation traverses the comminuted distal fibular fracture. Two
cannulated screws traverse the medial malleolar fracture. The
posterior malleolar fracture is obscured on lateral view by the with
abated hardware. Alignment is grossly anatomic.

FLUOROSCOPY TIME:  29 seconds, 4 images
IMPRESSION: 1. ORIF of the medial and lateral malleolar fractures as above. Near
anatomic alignment.

## 2021-10-19 ENCOUNTER — Other Ambulatory Visit (HOSPITAL_COMMUNITY)
Admission: RE | Admit: 2021-10-19 | Discharge: 2021-10-19 | Disposition: A | Discharge status: Home / Self Care | Payer: Medicaid Other | Source: Ambulatory Visit | Attending: Family Medicine | Admitting: Family Medicine

## 2021-10-19 ENCOUNTER — Telehealth: Payer: Self-pay | Admitting: Family Medicine

## 2021-10-19 ENCOUNTER — Other Ambulatory Visit: Payer: Self-pay | Admitting: Family Medicine

## 2021-10-19 ENCOUNTER — Ambulatory Visit (INDEPENDENT_AMBULATORY_CARE_PROVIDER_SITE_OTHER): Payer: Medicaid Other | Admitting: Family Medicine

## 2021-10-19 VITALS — BP 102/60 | HR 85 | Wt 170.0 lb

## 2021-10-19 DIAGNOSIS — R87612 Low grade squamous intraepithelial lesion on cytologic smear of cervix (LGSIL): Secondary | ICD-10-CM | POA: Diagnosis not present

## 2021-10-19 DIAGNOSIS — B977 Papillomavirus as the cause of diseases classified elsewhere: Secondary | ICD-10-CM | POA: Diagnosis not present

## 2021-10-19 DIAGNOSIS — N898 Other specified noninflammatory disorders of vagina: Secondary | ICD-10-CM | POA: Diagnosis not present

## 2021-10-19 DIAGNOSIS — N870 Mild cervical dysplasia: Secondary | ICD-10-CM | POA: Diagnosis not present

## 2021-10-19 LAB — POCT WET PREP (WET MOUNT)
Clue Cells Wet Prep Whiff POC: POSITIVE
Trichomonas Wet Prep HPF POC: ABSENT

## 2021-10-19 LAB — POCT URINE PREGNANCY: Preg Test, Ur: NEGATIVE

## 2021-10-19 MED ORDER — METRONIDAZOLE 500 MG PO TABS: 500.0000 mg | ORAL_TABLET | Freq: Two times a day (BID) | ORAL | 0 refills | Status: AC

## 2021-10-19 MED ORDER — IBUPROFEN 600 MG PO TABS: 600.0000 mg | ORAL_TABLET | Freq: Once | ORAL | 0 refills | Status: DC

## 2021-10-19 MED ORDER — IBUPROFEN 200 MG PO TABS
200.0000 mg | ORAL_TABLET | Freq: Once | ORAL | Status: AC
  Administered 2021-10-19: 600 mg via ORAL

## 2021-10-19 NOTE — Telephone Encounter (Signed)
HIPAA compliant callback message left.  Please, advise her that her wet prep is positive for bacteria vaginosis. Antibiotic escribed to her pharmacy.

## 2021-10-19 NOTE — Patient Instructions (Addendum)
It was wonderful to see you today.  Please bring ALL of your medications with you to every visit.   Today we talked about:  -We took 2 biopsies today.  We will let you know the results of this when they return. -You can continue take ibuprofen as needed for pain relief. -You may experience discolored discharge as well as some spotting.  -We are doing testing for sexually transmitted infections as well as BV and yeast.  We will let you know the results when they return. -If you test positive for sexually transmitted infection, it is important that both you and your partner or partners also be treated to prevent reinfection infection and spread.  Thank you for choosing [facility].   Please call [phone] with any questions about today's appointment.  Please be sure to schedule follow up at the front  desk before you leave today.   [name], DO PGY-2 Family Medicine    Colposcopy, Care After  The following information offers guidance on how to care for yourself after your procedure. Your doctor may also give you more specific instructions. If you have problems or questions, contact your doctor. What can I expect after the procedure? If you did not have a sample of your tissue taken out (did not have a biopsy), you may only have some spotting of blood for a few days. You can go back to your normal activities. If you had a sample of your tissue taken out, it is common to have: Soreness and mild pain. These may last for a few days. Mild bleeding or fluid (discharge) coming from your vagina. The fluid will look dark and grainy. You may have this for a few days. The fluid may be caused by a liquid that was used during your procedure. You may need to wear a sanitary pad. Spotting of blood for at least 48 hours after the procedure. Follow these instructions at home: Medicines Take over-the-counter and prescription medicines only as told by your doctor. Ask your  doctor what over-the-counter pain medicines and prescription medicines you can start taking again. This is very important if you take blood thinners. Activity For at least 3 days, or for as long as told by your doctor, avoid: Douching. Using tampons. Having sex. Return to your normal activities as told by your doctor. Ask your doctor what activities are safe for you. General instructions Ask your doctor if you may take baths, swim, or use a hot tub. You may take showers. If you use birth control (contraception), keep using it. Keep all follow-up visits. Contact a doctor if: You have a fever or chills. You faint or feel light-headed. Get help right away if: You bleed a lot from your vagina. A lot of bleeding means that the bleeding soaks through a pad in less than 1 hour. You have clumps of blood (blood clots) coming from your vagina. You have signs that could mean you have an infection. This may be fluid coming from your vagina that is: Different than normal. Yellow. Bad-smelling. You have very bad pain or cramps in your lower belly that do not get better with medicine. Summary If you did not have a sample of your tissue taken out, you may only have some spotting of blood for a few days. You can go back to your normal activities. If you had a sample of your tissue taken out, it is common to have mild pain for a few days and spotting for 48  hours. Avoid douching, using tampons, and having sex for at least 3 days after the procedure or for as long as told. Get help right away if you have a lot of bleeding, very bad pain, or signs of infection. This information is not intended to replace advice given to you by your health care provider. Make sure you discuss any questions you have with your health care provider. Document Revised: 10/09/2020 Document Reviewed: 10/09/2020 Elsevier Patient Education  [place].

## 2021-10-19 NOTE — Assessment & Plan Note (Signed)
White discharge noted in vaginal vault as well as cervical os.  STI screening and wet prep obtained today.

## 2021-10-19 NOTE — Assessment & Plan Note (Addendum)
Colposcopy performed today. Two cervical biopsies obtained at 10 and 6:00 positions as well as ECC (see procedure note below). Patient tolerated procedure well. No complications. 600 mg of Ibuprofen provided post-procedure for pain relief. Will follow up on pathology results.

## 2021-10-19 NOTE — Progress Notes (Signed)
    SUBJECTIVE:   CHIEF COMPLAINT / HPI:   Colposcopy for Abnormal Pap Smear Shannon Randolph is a 31 y.o. female who presents to [facility] today due to her history of abnormal Pap smears.  She was recommended for colposcopy back in October 2022, January 2021.  She previously had a colposcopy in May 2020 and February 2018, and no biopsies were obtained at either time due to no findings of abnormal lesions. Previous high-risk HPV genotyping negative in 09/2018.  Previous Pap smears: 02/2021 LSIL, HPV positive 05/2019: LSIL, HPV positive 09/2018: LSIL, HPV positive 05/2016 LSIL, HPV positive  PERTINENT  PMH / PSH:  Past Medical History:  Diagnosis Date   Abortion    Bacterial vaginosis    History of kidney stones    Infection    UTI, gonorrhea   No pertinent past medical history    OBJECTIVE:   BP 102/60   Pulse 85   Wt 170 lb (77.1 kg)   SpO2 99%   BMI 30.11 kg/m    General: NAD, pleasant, able to participate in exam Respiratory: Normal respiratory effort GU: GU: Normal appearance of labia majora and minora, without lesions. Vagina tissue pink, moist, without lesions or abrasions. White discharge present in vaginal vault. Cervix normal appearance, non-friable, white discharge from os Psych: Normal affect and mood  GU exam chaperoned by Glori Bickers, RN and Dr. Gwendlyn Deutscher   ASSESSMENT/PLAN:   Abnormal Pap smear of cervix Colposcopy performed today. Two cervical biopsies obtained at 10 and 6:00 positions as well as ECC (see procedure note below). Patient tolerated procedure well. No complications. 600 mg of Ibuprofen provided post-procedure for pain relief. Will follow up on pathology results.   Vaginal discharge White discharge noted in vaginal vault as well as cervical os.  STI screening and wet prep obtained today.   [facility] COLPOSCOPY PROCEDURE NOTE  Ms. Shannon Randolph is a 31 y.o. [id number] here for colposcopy for  LSIL and HPV+  pap smear on  02/2021 but also dating back to 05/2016. Discussed role for HPV in cervical dysplasia, need for surveillance.  Patient given informed consent, signed copy in the chart, time out was performed.  Placed in lithotomy position. Cervix viewed with speculum and colposcope after application of acetic acid.   Colposcopy adequate? Yes  acetowhite lesion(s) noted at 10 and 6 o'clock; biopsies obtained of both areas. Negative Lugols iodine uptake at both 10 and 6 o'clock positions as well.  ECC specimen obtained. All specimens were labelled and sent to pathology.  Patient was given post procedure instructions.  Will follow up pathology and manage accordingly.  Routine preventative health maintenance measures emphasized.   [name] PGY-2 Family Medicine  10/19/2021 11:05 AM

## 2021-10-20 ENCOUNTER — Encounter: Payer: Self-pay | Admitting: Family Medicine

## 2021-10-20 ENCOUNTER — Telehealth: Payer: Self-pay | Admitting: Family Medicine

## 2021-10-20 LAB — CERVICOVAGINAL ANCILLARY ONLY
Chlamydia: NEGATIVE
Comment: NEGATIVE
Comment: NORMAL
Neisseria Gonorrhea: NEGATIVE

## 2021-10-20 NOTE — Telephone Encounter (Signed)
I discussed her cervical biopsy report with her.  + LSIL similar to her PAP result >PAP due in Oct. I discussed co-testing in a year. I also discussed her wet prep result and that A/B was escribed to her pharmacy for BV. GC/Chlamydia are pending. I will reach out to her once I have those results. She agreed with the plan.

## 2022-02-20 ENCOUNTER — Ambulatory Visit: Payer: Medicaid Other | Admitting: Family Medicine

## 2022-03-23 ENCOUNTER — Other Ambulatory Visit (HOSPITAL_COMMUNITY)
Admission: RE | Admit: 2022-03-23 | Discharge: 2022-03-23 | Disposition: A | Discharge status: Home / Self Care | Payer: Medicaid Other | Source: Ambulatory Visit | Attending: Family Medicine | Admitting: Family Medicine

## 2022-03-23 ENCOUNTER — Ambulatory Visit (INDEPENDENT_AMBULATORY_CARE_PROVIDER_SITE_OTHER): Payer: Medicaid Other | Admitting: Student

## 2022-03-23 ENCOUNTER — Encounter: Payer: Self-pay | Admitting: Student

## 2022-03-23 VITALS — BP 102/70 | HR 85 | Ht 63.0 in | Wt 171.0 lb

## 2022-03-23 DIAGNOSIS — N898 Other specified noninflammatory disorders of vagina: Secondary | ICD-10-CM | POA: Diagnosis not present

## 2022-03-23 LAB — POCT WET PREP (WET MOUNT)
Clue Cells Wet Prep Whiff POC: POSITIVE
Trichomonas Wet Prep HPF POC: ABSENT

## 2022-03-23 MED ORDER — METRONIDAZOLE 0.75 % VA GEL: 1.0000 | Freq: Every day | VAGINAL | 0 refills | Status: DC

## 2022-03-23 NOTE — Progress Notes (Signed)
  SUBJECTIVE:   CHIEF COMPLAINT / HPI:   Vaginal discharge Has been an issues since the last time she was seen (10/19/21), patient was unable to take full course of medication because of nausea and vomiting. And this happens everytime she takes metronidizole. No dysuria, no vaginal burning, no back pain, no systemic symptoms (fever, body aches). Is open to screening for STI's, but not wanting to give blood. Reports copious amounts of vaginal discharge, filling up pads multiple times a day.  PERTINENT  PMH / PSH: recent BV 10/19/21    OBJECTIVE:  BP 102/70   Pulse 85   Ht 5\' 3"  (1.6 m)   Wt 171 lb (77.6 kg)   LMP 03/19/2022 (Approximate)   SpO2 98%   BMI 30.29 kg/m  Physical Exam Constitutional:      Appearance: Normal appearance.  Cardiovascular:     Rate and Rhythm: Normal rate and regular rhythm.     Pulses: Normal pulses.     Heart sounds: Normal heart sounds.  Pulmonary:     Effort: Pulmonary effort is normal.     Breath sounds: Normal breath sounds.  Abdominal:     General: Abdomen is flat.     Palpations: Abdomen is soft.  Neurological:     Mental Status: She is alert.  Psychiatric:        Mood and Affect: Mood normal.    Patient self swabbed, did not perform pelvic exam  ASSESSMENT/PLAN:  Vaginal discharge Assessment & Plan: Patient complaining of vaginal discharge since last visit 10/19/21, where she started tx with metronidazole, but was unable to complete tx due to GI issues (nausea/vomitting). Patient wet prep came back positive for BV, discussed oral clindamycin vs. Vaginal metronidazole and patient elected for vaginal metronidazole. Patient elected to self swab today, and did not want pelvic exam, patient appeared well and had no complaints related to her vulva, outside of the discharge (no burning, itching). Patient also denied dysuria and had no CVA tenderness making UTI unlikely.  -0.75% metronidazole Vaginal gel daily x 5 days  Orders: -     POCT Wet Prep  [facility]) -     Cervicovaginal ancillary only  Other orders -     metroNIDAZOLE; Place 1 Applicatorful vaginally daily.  Dispense: 70 g; Refill: 0   No follow-ups on file. Holley Bouche, MD 03/23/2022, 12:35 PM PGY-2, [facility]

## 2022-03-23 NOTE — Patient Instructions (Signed)
It was great to see you! Thank you for allowing me to participate in your care!  We will test you for STI/cause of your symptoms. I will also attach a list of therapist to consider talking to about your aversion to sex  Our plans for today:  - Testing for STI's - Will call with results and be considerate of medication.    Therapy and Counseling Resources Most providers on this list will take Medicaid. Patients with commercial insurance or Medicare should contact their insurance company to get a list of in network providers.  [employer] [employer] (takes children) Location 1: [address], [place], [place] [address] Location 2: [place], [place] [address] [place] ([place] speaking therapist available)(habla espanol)(take medicare and medicaid)  [place], [place], [place] [address], [place] [email or web address] [phone]  [facility] [address] [place], [place] [address] [place], [place], [place] [address]      [phone]  [place] (spanish available) [place], [place] [address] [place] (take [facility] and medicare) [address], [place], [place] [address]       [phone]     [place] (virtual only) [phone]  [facility] [address] [place], [place]  [facility]:  [place]. [place] [phone]  [facility]:  [address] [place] [phone]  [facility] (under & uninsured) [address], [place] [phone]    [email or web address]    [place] [address]  [place]    [phone]  [facility] [place]     Phone:   [phone]     [place]  Jagual #1 [address]      [place], [place] ext [place]: [place], [place], [place]   [place] ([place] therapist) [email or web address]  [place] [address]   [place] [address]    [phone]    [facility]   [address],  [place], [place]   [place]  [place]  [facility]  [address] [place], [place]        [phone]  Open Access/Walk In Clinic under & uninsured  [facility]  [address] [place], [place] Crisis [phone]  [facility] [place],  ([place])   [place], [place]: [phone]) 8:30 - 12; 1 - 2:30  [facility] of the [place],  [place], [place]    [phone]):8:30 - 12; 2 - 3PM  [facility],  [address]  [place]; [phone]):   Mon - Fri 8 AM - 5 PM  [facility]  MWF 12:30 to 3:00 or call to schedule an appointment  [phone]  Specific Provider options Psychology Today  [email or web address] click on find a therapist  enter your zip code left side and select or tailor a therapist for your specific need.   [facility] Provider Directory [email or web address]  (  Medicaid)   Follow all drop down to find a provider  Social Support program [facility] [place] [phone] or PhotoSolver.pl [address], [place], [place] Recovery support and educational   24- Hour Availability:   [facility]  [address] [place], [place] [phone] Crisis [phone]  Family Service of the [facility] [phone]  [place]  Crisis Service  [phone]   [facility]  [phone] (after hours)  Therapeutic Alternative/Mobile Crisis   [phone]  [place] National Suicide Hotline  [phone] Len Child[place])  Call 911 or go to emergency room  [facility]  [phone]);  [place] and [employer]  [phone]); [place], [place], [place], [place], [place], [place], [place]   Take care and seek immediate care sooner if you develop any concerns.   Dr. Bess Kinds, MD [facility]

## 2022-03-23 NOTE — Assessment & Plan Note (Addendum)
Patient complaining of vaginal discharge since last visit 10/19/21, where she started tx with metronidazole, but was unable to complete tx due to GI issues (nausea/vomitting). Patient wet prep came back positive for BV, discussed oral clindamycin vs. Vaginal metronidazole and patient elected for vaginal metronidazole. Patient elected to self swab today, and did not want pelvic exam, patient appeared well and had no complaints related to her vulva, outside of the discharge (no burning, itching). Patient also denied dysuria and had no CVA tenderness making UTI unlikely.  -0.75% metronidazole Vaginal gel daily x 5 days

## 2022-03-26 LAB — CERVICOVAGINAL ANCILLARY ONLY
Chlamydia: NEGATIVE
Comment: NEGATIVE
Comment: NORMAL
Neisseria Gonorrhea: NEGATIVE

## 2022-04-09 ENCOUNTER — Telehealth: Payer: Self-pay

## 2022-04-09 NOTE — Telephone Encounter (Signed)
Called to discuss BV issues with patient, per note from nurse line. Patient did not answer. Will call back.

## 2022-04-09 NOTE — Telephone Encounter (Signed)
Patient calls nurse line regarding treatment of BV. She reports that she recently finished metrogel treatment, however, vaginal odor returned yesterday.   She reports that she was administering insert in the morning and the evening. She also states that she takes 3-4 baths per day.   She denies sexual activity since last appointment.   Patient reports that oral medication causes nausea and vomiting. She is requesting to speak with Dr. Barbaraann Faster regarding next steps in treatment.   Please advise.   Veronda Prude, RN

## 2022-04-11 NOTE — Telephone Encounter (Signed)
Patient returns call to nurse line.   Patient would like to discuss next steps. I asked patient if we could leave a VM if she does not answer again. She reports she does not have a working VM right now.   Patient asks provider relay message to nursing staff so we can update her.   Will forward back to provider.

## 2022-04-13 ENCOUNTER — Telehealth: Payer: Self-pay | Admitting: Student

## 2022-04-13 ENCOUNTER — Other Ambulatory Visit: Payer: Self-pay | Admitting: Student

## 2022-04-13 DIAGNOSIS — N898 Other specified noninflammatory disorders of vagina: Secondary | ICD-10-CM

## 2022-04-13 MED ORDER — METRONIDAZOLE 0.75 % VA GEL: 1.0000 | Freq: Every day | VAGINAL | 0 refills | Status: DC

## 2022-04-13 NOTE — Progress Notes (Signed)
Called patient mobile phone to inform her that I would be sending a refill of the metronidazole gel she had previously used. We will try a second round of tx to see if symptoms resolve.

## 2022-04-13 NOTE — Telephone Encounter (Signed)
Called to inform patient that I have sent a refill of the medication she had previously tried. Will attempt another trial of the metronidazole gel to see if able to achieve resolution of symptoms. Patient to reach out if this fails a 2nd time.

## 2022-04-23 NOTE — Telephone Encounter (Signed)
Patient returns call to nurse line. Advised of provider message.   Patient will follow up if symptoms do not resolve with second round of metronidazole.   Veronda Prude, RN

## 2022-05-07 ENCOUNTER — Other Ambulatory Visit (HOSPITAL_COMMUNITY)
Admission: RE | Admit: 2022-05-07 | Discharge: 2022-05-07 | Disposition: A | Discharge status: Home / Self Care | Payer: Medicaid Other | Source: Ambulatory Visit | Attending: Family Medicine | Admitting: Family Medicine

## 2022-05-07 ENCOUNTER — Other Ambulatory Visit: Payer: Self-pay

## 2022-05-07 ENCOUNTER — Encounter: Payer: Self-pay | Admitting: Family Medicine

## 2022-05-07 ENCOUNTER — Ambulatory Visit (INDEPENDENT_AMBULATORY_CARE_PROVIDER_SITE_OTHER): Payer: Medicaid Other | Admitting: Family Medicine

## 2022-05-07 VITALS — BP 138/77 | HR 97 | Wt 174.0 lb

## 2022-05-07 DIAGNOSIS — Z3490 Encounter for supervision of normal pregnancy, unspecified, unspecified trimester: Secondary | ICD-10-CM | POA: Diagnosis not present

## 2022-05-07 DIAGNOSIS — D649 Anemia, unspecified: Secondary | ICD-10-CM | POA: Diagnosis not present

## 2022-05-07 DIAGNOSIS — B3731 Acute candidiasis of vulva and vagina: Secondary | ICD-10-CM | POA: Diagnosis not present

## 2022-05-07 DIAGNOSIS — Z131 Encounter for screening for diabetes mellitus: Secondary | ICD-10-CM

## 2022-05-07 DIAGNOSIS — R102 Pelvic and perineal pain: Secondary | ICD-10-CM | POA: Diagnosis not present

## 2022-05-07 DIAGNOSIS — Z113 Encounter for screening for infections with a predominantly sexual mode of transmission: Secondary | ICD-10-CM

## 2022-05-07 DIAGNOSIS — E663 Overweight: Secondary | ICD-10-CM | POA: Diagnosis not present

## 2022-05-07 DIAGNOSIS — Z124 Encounter for screening for malignant neoplasm of cervix: Secondary | ICD-10-CM

## 2022-05-07 LAB — POCT WET PREP (WET MOUNT)
Clue Cells Wet Prep Whiff POC: NEGATIVE
Trichomonas Wet Prep HPF POC: ABSENT
WBC, Wet Prep HPF POC: NONE SEEN

## 2022-05-07 LAB — POCT URINE PREGNANCY: Preg Test, Ur: POSITIVE — AB

## 2022-05-07 NOTE — Assessment & Plan Note (Signed)
2-week duration of intermittent pelvic and lower abdominal cramping and pain.  Differential is broad, but includes endometriosis, fibroids, PID, pregnancy, STI, BV or yeast infection, UTI.  Wet prep and Pap obtained today, will run STI testing off of Pap.  Blood collected for HIV and RPR.  Pregnancy test returned positive, see pregnancy header in problem list for more information.  Will order transvaginal ultrasound to rule out ectopic pregnancy.

## 2022-05-07 NOTE — Patient Instructions (Addendum)
It was wonderful to see you today. Thank you for allowing me to be a part of your care. Below is a short summary of what we discussed at your visit today:  Pelvic pain Today we obtained some swabs to make sure there is no infectious reason for your pelvic pain and cramping.  We also repeated your PAP smear since you are due at this time.  If the results are normal, I will send you a letter or MyChart message. If the results are abnormal, I will give you a call.   The results of your pregnancy test are positive. Please make a new OB appointment at the front desk before you leave.   Yeast infection Your vaginal exam shows discharge consistent with a yeast infection.  I cannot prescribe diflucan because you are pregnant.   Anemia Today we collected blood work to see if you are still anemic and if this is due to an iron deficiency. If the results are normal, I will send you a letter or MyChart message. If the results are abnormal, I will give you a call.    STI screening - Today we obtained a vaginal swab to screen for gonorrhea, chlamydia, and trichomonas - We also obtained a blood sample to screen for HIV, Hep C, and syphilis - As above, if the results are normal, I will send you a letter or MyChart message. If the results are abnormal, I will give you a call.      Please bring all of your medications to every appointment!  If you have any questions or concerns, please do not hesitate to contact us via phone or MyChart message.   Fayette Pho, MD

## 2022-05-07 NOTE — Assessment & Plan Note (Signed)
Urine pregnancy test obtained today for pelvic pain workup.  Pregnancy test positive.  Based on LMP, would be 3 weeks 5 days today.  Given concurrent pelvic pain and cramping, will order transvaginal ultrasound to rule out ectopic pregnancy.  Will collect OB panel today.  Patient to schedule new OB visit at the front desk.

## 2022-05-07 NOTE — Assessment & Plan Note (Signed)
Due for repeat Pap today.  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  Will follow results.  Exam technically difficult today given thick vaginal discharge.

## 2022-05-07 NOTE — Progress Notes (Signed)
    SUBJECTIVE:   CHIEF COMPLAINT / HPI:   Pelvic pain Ms. Norell is a 31 year old woman here for 2-week history of lower abdominal pain and cramping.  She reports anterior pelvic pain intermittently without radiation.  No flank or back involvement.  This is occurred outside of her normal menses.  Denies dysuria, fever, chills, rashes or lesions of vulva, and rectal pain with defecation.  Tylenol and ibuprofen provide moderate relief.  After discussion, she is also amenable to STI testing at this time for workup of pelvic pain.  She is also due for repeat Pap, amenable to this today. -Concern for specific STI exposure? 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 2022  PERTINENT  PMH / PSH: Low back pain, BV, tobacco use  OBJECTIVE:   BP 138/77   Pulse 97   Wt 174 lb (78.9 kg)   LMP 04/11/2022   SpO2 100%   BMI 30.82 kg/m    Physical Exam General: Awake, alert, oriented, no acute distress Respiratory: Normal work of breathing, no respiratory distress Neuro: Cranial nerves II through X grossly intact, able to move all extremities spontaneously Vulva: Normal appearing vulva without rashes, lesions, or deformities Vagina: Pale pink rugated vaginal tissue without obvious lesions, thick white discharge, cervix without lesion or overt tenderness with swab  Sensitive exam performed with chaperone in the room:  Maree Erie, CMA  ASSESSMENT/PLAN:   Screening for malignant neoplasm of cervix Due for repeat Pap today.  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  Will follow results.  Exam technically difficult today given thick vaginal discharge.  Pelvic pain 2-week duration of  intermittent pelvic and lower abdominal cramping and pain.  Differential is broad, but includes endometriosis, fibroids, PID, pregnancy, STI, BV or yeast infection, UTI.  Wet prep and Pap obtained today, will run STI testing off of Pap.  Blood collected for HIV and RPR.  Pregnancy test returned positive, see pregnancy header in problem list for more information.  Will order transvaginal ultrasound to rule out ectopic pregnancy.  Pregnancy Urine pregnancy test obtained today for pelvic pain workup.  Pregnancy test positive.  Based on LMP, would be 3 weeks 5 days today.  Given concurrent pelvic pain and cramping, will order transvaginal ultrasound to rule out ectopic pregnancy.  Will collect OB panel today.  Patient to schedule new OB visit at the front desk.     Fayette Pho, MD [facility]

## 2022-05-08 ENCOUNTER — Telehealth: Payer: Self-pay | Admitting: Family Medicine

## 2022-05-08 LAB — IRON AND TIBC
Iron Saturation: 17 % (ref 15–55)
Iron: 68 ug/dL (ref 27–159)
Total Iron Binding Capacity: 395 ug/dL (ref 250–450)
UIBC: 327 ug/dL (ref 131–425)

## 2022-05-08 NOTE — Telephone Encounter (Signed)
Saw patient's [place] scheduled for 12/14. Discussed with Dr. Deirdre Priest, called patient to discuss MAU precautions for worsening pain.   She reports slightly worsened lower abdominal pain that radiates to her back. No vaginal bleeding, dizziness, or pale skin. Discussed recommendation to go to MAU if pain worsens. Discussed entrance C, and directions on how to find the women's center.  Patient verablizes understanding. Said she would have to find someone to watch her 3 children if she does go. She would like to wait for her ultrasound on 12/14 as long as the pain does not get any worse or change. Reviewed MAU precautions.   Fayette Pho, MD

## 2022-05-09 ENCOUNTER — Encounter: Payer: Self-pay | Admitting: Family Medicine

## 2022-05-09 DIAGNOSIS — R7303 Prediabetes: Secondary | ICD-10-CM | POA: Insufficient documentation

## 2022-05-09 LAB — CBC/D/PLT+RPR+RH+ABO+RUBIGG...
Basophils Absolute: 0.1 10*3/uL (ref 0.0–0.2)
Basos: 1 %
Bilirubin, UA: NEGATIVE
EOS (ABSOLUTE): 0.2 10*3/uL (ref 0.0–0.4)
Eos: 1 %
Glucose, UA: NEGATIVE
HCV Ab: NONREACTIVE
HIV Screen 4th Generation wRfx: NONREACTIVE
Hematocrit: 37.9 % (ref 34.0–46.6)
Hemoglobin: 12.9 g/dL (ref 11.1–15.9)
Hepatitis B Surface Ag: NEGATIVE
Immature Grans (Abs): 0.1 10*3/uL (ref 0.0–0.1)
Immature Granulocytes: 1 %
Ketones, UA: NEGATIVE
Leukocytes,UA: NEGATIVE
Lymphocytes Absolute: 3.1 10*3/uL (ref 0.7–3.1)
Lymphs: 22 %
MCH: 29 pg (ref 26.6–33.0)
MCHC: 34 g/dL (ref 31.5–35.7)
MCV: 85 fL (ref 79–97)
Monocytes Absolute: 0.9 10*3/uL (ref 0.1–0.9)
Monocytes: 6 %
Neutrophils Absolute: 10 10*3/uL — ABNORMAL HIGH (ref 1.4–7.0)
Neutrophils: 69 %
Nitrite, UA: NEGATIVE
Platelets: 294 10*3/uL (ref 150–450)
Protein,UA: NEGATIVE
RBC, UA: NEGATIVE
RBC: 4.45 x10E6/uL (ref 3.77–5.28)
RDW: 12.3 % (ref 11.7–15.4)
RPR Ser Ql: NONREACTIVE
Rh Factor: POSITIVE
Rubella Antibodies, IGG: 1.16 index (ref >0.99)
Specific Gravity, UA: 1.007 (ref 1.005–1.030)
Urobilinogen, Ur: 0.2 mg/dL (ref 0.2–1.0)
WBC: 14.4 10*3/uL — ABNORMAL HIGH (ref 3.4–10.8)
pH, UA: 7 (ref 5.0–7.5)

## 2022-05-09 LAB — AB SCR+ANTIBODY ID: Antibody Screen: POSITIVE — AB

## 2022-05-09 LAB — HCV INTERPRETATION

## 2022-05-09 LAB — HEMOGLOBIN A1C
Est. average glucose Bld gHb Est-mCnc: 117 mg/dL
Hgb A1c MFr Bld: 5.7 % — ABNORMAL HIGH (ref 4.8–5.6)

## 2022-05-09 LAB — MICROSCOPIC EXAMINATION
Bacteria, UA: NONE SEEN
Casts: NONE SEEN /lpf
RBC, Urine: NONE SEEN /hpf (ref 0–2)
WBC, UA: NONE SEEN /hpf (ref 0–5)

## 2022-05-09 LAB — URINE CULTURE, OB REFLEX

## 2022-05-09 LAB — FERRITIN: Ferritin: 142 ng/mL (ref 15–150)

## 2022-05-10 ENCOUNTER — Inpatient Hospital Stay (HOSPITAL_COMMUNITY)
Admission: AD | Admit: 2022-05-10 | Discharge: 2022-05-10 | Discharge status: Against Medical Advice | Payer: Medicaid Other | Attending: Obstetrics and Gynecology | Admitting: Obstetrics and Gynecology

## 2022-05-10 ENCOUNTER — Encounter (HOSPITAL_COMMUNITY): Payer: Self-pay | Admitting: Obstetrics and Gynecology

## 2022-05-10 ENCOUNTER — Ambulatory Visit: Payer: Medicaid Other | Attending: Family Medicine

## 2022-05-10 DIAGNOSIS — R109 Unspecified abdominal pain: Secondary | ICD-10-CM | POA: Insufficient documentation

## 2022-05-10 DIAGNOSIS — O26891 Other specified pregnancy related conditions, first trimester: Secondary | ICD-10-CM | POA: Insufficient documentation

## 2022-05-10 DIAGNOSIS — Z5321 Procedure and treatment not carried out due to patient leaving prior to being seen by health care provider: Secondary | ICD-10-CM | POA: Diagnosis not present

## 2022-05-10 DIAGNOSIS — Z3A01 Less than 8 weeks gestation of pregnancy: Secondary | ICD-10-CM | POA: Insufficient documentation

## 2022-05-10 NOTE — MAU Note (Addendum)
.  Shannon Randolph is a 31 y.o. at [redacted]w[redacted]d here in MAU reporting: still having abd pain. Scheduled for f/u U/S today but was unable to find [facility] . Called office and told her to just stay at [facility] and have it done in MAU.  Denies any vag bleeding or discharge.  LMP: 04/11/22 Onset of complaint: 4 weeks Pain score: 7 Vitals:   05/10/22 1714  BP: 124/72  Pulse: (!) 104  Resp: 18  Temp: 98.4 F (36.9 C)     FHT:n/a Lab orders placed from triage:  u/a

## 2022-05-10 NOTE — MAU Note (Addendum)
Pt unable to stay ride had to go to work. Instructed pt to come back when she could or schedule a appointment with her OB office. E.Lawrence,NP notified of pt needing to sign out AMA

## 2022-05-16 LAB — CYTOLOGY - PAP
Adequacy: ABSENT
Chlamydia: NEGATIVE
Comment: NEGATIVE
Comment: NEGATIVE
Comment: NORMAL
Diagnosis: NEGATIVE
High risk HPV: NEGATIVE
Neisseria Gonorrhea: NEGATIVE

## 2022-05-16 NOTE — Progress Notes (Signed)
Per ASCCP guidelines, given colposcopy LSIL in May and normal PAP today with negative HPV, recommend one year follow up.  Fayette Pho, MD

## 2022-05-22 ENCOUNTER — Other Ambulatory Visit: Payer: Self-pay | Admitting: Family Medicine

## 2022-05-22 NOTE — Progress Notes (Signed)
Chart reviewed for clinical follow up, since I have not received results from [place] or [facility] notes. Apparently on 12/14, patient could not find the [facility] and went to MAU instead. She was instructed by [facility] to stay there and have US done at MAU. Unfortunately, her ride needed to leave for work so she left prior to obtaining US.   I will try to call her in the morning and have her call [facility] and reschedule directly with them. I do not believe they need a new order.   Fayette Pho, MD

## 2022-05-23 ENCOUNTER — Telehealth: Payer: Self-pay | Admitting: Family Medicine

## 2022-05-23 NOTE — Telephone Encounter (Signed)
Called patient regarding [place] not obtained. Patient sounds quite sleepy and soft spoken, I must have woken her up. Discussed that she can call to reschedule. Patient declined me giving her the phone number, says she does not need it.   Encouraged her to call [facility] and reschedule as soon as possible.   Fayette Pho, MD

## 2022-06-11 ENCOUNTER — Telehealth: Payer: Self-pay | Admitting: Family Medicine

## 2022-06-11 ENCOUNTER — Encounter: Payer: Self-pay | Admitting: Family Medicine

## 2022-06-11 ENCOUNTER — Ambulatory Visit (INDEPENDENT_AMBULATORY_CARE_PROVIDER_SITE_OTHER): Payer: Medicaid Other | Admitting: Family Medicine

## 2022-06-11 ENCOUNTER — Other Ambulatory Visit (HOSPITAL_COMMUNITY)
Admission: RE | Admit: 2022-06-11 | Discharge: 2022-06-11 | Disposition: A | Discharge status: Home / Self Care | Payer: Medicaid Other | Source: Ambulatory Visit | Attending: Family Medicine | Admitting: Family Medicine

## 2022-06-11 VITALS — BP 118/64 | HR 92 | Ht 63.0 in | Wt 175.0 lb

## 2022-06-11 DIAGNOSIS — Z23 Encounter for immunization: Secondary | ICD-10-CM

## 2022-06-11 DIAGNOSIS — N898 Other specified noninflammatory disorders of vagina: Secondary | ICD-10-CM

## 2022-06-11 DIAGNOSIS — Z3A01 Less than 8 weeks gestation of pregnancy: Secondary | ICD-10-CM

## 2022-06-11 DIAGNOSIS — O039 Complete or unspecified spontaneous abortion without complication: Secondary | ICD-10-CM

## 2022-06-11 DIAGNOSIS — N739 Female pelvic inflammatory disease, unspecified: Secondary | ICD-10-CM | POA: Diagnosis not present

## 2022-06-11 LAB — POCT URINALYSIS DIP (MANUAL ENTRY)
Bilirubin, UA: NEGATIVE
Glucose, UA: NEGATIVE mg/dL
Ketones, POC UA: NEGATIVE mg/dL
Leukocytes, UA: NEGATIVE
Nitrite, UA: NEGATIVE
Protein Ur, POC: NEGATIVE mg/dL
Spec Grav, UA: 1.02 (ref 1.010–1.025)
Urobilinogen, UA: 1 E.U./dL
pH, UA: 8 (ref 5.0–8.0)

## 2022-06-11 LAB — POCT WET PREP (WET MOUNT)
Clue Cells Wet Prep Whiff POC: NEGATIVE
Trichomonas Wet Prep HPF POC: ABSENT

## 2022-06-11 LAB — POCT UA - MICROSCOPIC ONLY: WBC, Ur, HPF, POC: NONE SEEN (ref 0–5)

## 2022-06-11 MED ORDER — METRONIDAZOLE 500 MG PO TABS: 500.0000 mg | ORAL_TABLET | Freq: Two times a day (BID) | ORAL | 0 refills | Status: AC

## 2022-06-11 MED ORDER — DOXYCYCLINE HYCLATE 100 MG PO TABS: 100.0000 mg | ORAL_TABLET | Freq: Two times a day (BID) | ORAL | 0 refills | Status: AC

## 2022-06-11 NOTE — Telephone Encounter (Signed)
Patient returns call to nurse line.   Patient advised to go to MAU per Dr. Jeani Hawking instruction.   Patient voiced understanding.

## 2022-06-11 NOTE — Assessment & Plan Note (Signed)
Received flu vaccine today, tolerated well, no adverse side effects.

## 2022-06-11 NOTE — Patient Instructions (Signed)
It was wonderful to see you today. Thank you for allowing me to be a part of your care. Below is a short summary of what we discussed at your visit today:  Post-pregnancy termination Today we obtained vaginal swabs, urine sample, and blood work. If the results are normal, I will send you a letter or MyChart message. If the results are abnormal, I will give you a call.    STI screening - Today we obtained a vaginal swab to screen for gonorrhea, chlamydia, and trichomonas - We also obtained a blood sample to screen for HIV and syphilis - As above, if the results are normal, I will send you a letter or MyChart message. If the results are abnormal, I will give you a call.      If you have any questions or concerns, please do not hesitate to contact us via phone or MyChart message.   Ezequiel Essex, MD

## 2022-06-11 NOTE — Assessment & Plan Note (Signed)
S/p abortion 05/29/22, now with brown vaginal bleeding daily and vaginal pain on examination.  UA shows trace blood (not unexpected given vaginal bleeding), prep negative for BV, yeast, or Trichomonas.  We obtained cervical vaginal swab for gonorrhea, chlamydia.  Obtained blood for quant beta hCG, CBC, HIV, RPR, and TSH.  Unfortunately, we do not have any ultrasounds on file or baseline quant beta-hCG.  I am unsure which clinic in town she received her abortion care.  I precepted patient while she was in the lab getting her blood drawn, and was unable to contact her or find her in clinic to ask her to go to MAU for an ultrasound to check for retained products of conception.  I will prescribe antibiotics and try and reach her to have her go to MAU for ultrasound. - Doxycycline 100 mg twice daily x 10 days - Metronidazole 500 mg twice daily x 10 days

## 2022-06-11 NOTE — Progress Notes (Signed)
SUBJECTIVE:   CHIEF COMPLAINT / HPI:   Vaginal complaint Patient reports ongoing brown discharge ever since pregnancy termination on 05/29/2022.  She was found to be pregnant at [facility] on 05/07/2022.  At that time, based on LMP estimated to be 3 weeks 5 days.  There was concern about possible ectopic pregnancy given cramping and pelvic pain, patient was scheduled for ultrasound at [facility] 3 days later, but encouraged to go to the MAU for ultrasound same day; she went, but ended up leaving without being seen due to time constraints for the person giving her a ride.  Today, patient reports she terminated the pregnancy on 05/29/2022 and was post to return to the abortion clinic today, 1/15, for appropriate follow-up however made appointment with [place] instead due to her vaginal discharge.  She reports for the first 3 days after taking her abortive medications, she had the expected bleeding and cramping, which later slowed down.  She reports she is still experiencing daily brown discharge with intermittent abdominal pain and "flutters".  Denies nausea, vomiting, fever, chills, abdominal or pelvic pain.  PERTINENT  PMH / PSH:  Patient Active Problem List   Diagnosis Date Noted   Post-abortion care 06/11/2022   Need for immunization against influenza 06/11/2022   Prediabetes 05/09/2022   Pregnancy 05/07/2022   Pelvic pain 05/07/2022   Cervical cancer screening 05/07/2022   Birth control counseling 01/21/2019   Screening for malignant neoplasm of cervix 09/30/2018   Abnormal Pap smear of cervix 07/04/2016   Tachycardia    Vaginal discharge 10/24/2012   LBP (low back pain) 10/18/2011   TOBACCO ABUSE 04/07/2009    OBJECTIVE:   BP 118/64   Pulse 92   Ht 5\' 3"  (1.6 m)   Wt 175 lb (79.4 kg)   LMP 04/11/2022 (Approximate)   SpO2 99%   Breastfeeding No   BMI 31.00 kg/m    Physical Exam General: Awake, alert, oriented, no acute distress Respiratory: Normal work of breathing, no respiratory  distress Neuro: Cranial nerves II through X grossly intact, able to move all extremities spontaneously Vulva: Normal appearing vulva without rashes, lesions, or deformities Vagina: Pale pink rugated vaginal tissue without obvious lesions, moderate thin brown discharge within vaginal vault, cervix without lesion, introitus tender upon speculum insertion, posterior vaginal wall tender to speculum manipulation  Sensitive exam performed with chaperone in the room:  Jazmin Hartsell, CMA   ASSESSMENT/PLAN:   Need for immunization against influenza Received flu vaccine today, tolerated well, no adverse side effects.  Post-abortion care S/p abortion 05/29/22, now with brown vaginal bleeding daily and vaginal pain on examination.  UA shows trace blood (not unexpected given vaginal bleeding), prep negative for BV, yeast, or Trichomonas.  We obtained cervical vaginal swab for gonorrhea, chlamydia.  Obtained blood for quant beta hCG, CBC, HIV, RPR, and TSH.  Unfortunately, we do not have any ultrasounds on file or baseline quant beta-hCG.  I am unsure which clinic in town she received her abortion care.  I precepted patient while she was in the lab getting her blood drawn, and was unable to contact her or find her in clinic to ask her to go to MAU for an ultrasound to check for retained products of conception.  I will prescribe antibiotics and try and reach her to have her go to MAU for ultrasound. - Doxycycline 100 mg twice daily x 10 days - Metronidazole 500 mg twice daily x 10 days    Ezequiel Essex, MD [place]

## 2022-06-11 NOTE — Telephone Encounter (Signed)
Precepted patient case with Lyndee Hensen, DO, who suggested patient simply go over to MAU for same-day ultrasound to rule out retained products of conception.   Patient dropped off urine sample but was not in lab waiting area as instructed. Could not find patient anywhere in clinic, we believe she has left without getting her blood work.   I tried calling to advise her to go to the MAU for an ultrasound, however she did not answer and there was no voicemail set up. I have not yet ordered a TVUS, because we would still need to call her to notify of the appointment.   I will try to call again later.   Ezequiel Essex, MD

## 2022-06-12 LAB — CERVICOVAGINAL ANCILLARY ONLY
Chlamydia: NEGATIVE
Comment: NEGATIVE
Comment: NORMAL
Neisseria Gonorrhea: NEGATIVE

## 2022-06-13 ENCOUNTER — Encounter: Payer: Self-pay | Admitting: Family Medicine

## 2022-06-14 ENCOUNTER — Other Ambulatory Visit: Payer: Self-pay | Admitting: Student

## 2022-06-14 ENCOUNTER — Telehealth: Payer: Self-pay

## 2022-06-14 NOTE — Telephone Encounter (Signed)
Called patient back. Confirmed correct patient with DOB.   Discussed concern for possible intrauterine infection given her STI screen, wet prep, and urine testing was negative.   Advised her to pick up and start the doxycycline and metronidazole asap.   Also instructed to present to the MAU as soon as possible to perform ultrasound to r/o retained products of conception. Patient voiced understanding.   Ezequiel Essex, MD

## 2022-06-14 NOTE — Telephone Encounter (Signed)
Patient calls nurse line requesting results from wet prep. Advised that wet prep was normal. She is asking to speak with Dr. Jeani Hawking as she continues to have vaginal odor and discharge.   Please return call to [phone].  Talbot Grumbling, RN

## 2022-07-10 ENCOUNTER — Encounter: Payer: Self-pay | Admitting: Family Medicine

## 2022-07-10 ENCOUNTER — Ambulatory Visit (INDEPENDENT_AMBULATORY_CARE_PROVIDER_SITE_OTHER): Payer: Medicaid Other | Admitting: Family Medicine

## 2022-07-10 VITALS — BP 105/70 | HR 103 | Temp 97.9°F | Ht 63.0 in | Wt 174.4 lb

## 2022-07-10 DIAGNOSIS — Z3009 Encounter for other general counseling and advice on contraception: Secondary | ICD-10-CM | POA: Diagnosis not present

## 2022-07-10 LAB — POCT URINE PREGNANCY: Preg Test, Ur: NEGATIVE

## 2022-07-10 MED ORDER — NORELGESTROMIN-ETH ESTRADIOL 150-35 MCG/24HR TD PTWK: 1.0000 | MEDICATED_PATCH | TRANSDERMAL | 12 refills | Status: DC

## 2022-07-10 NOTE — Patient Instructions (Signed)
It was great seeing you today!  We started you on the birth control patch.  As we discussed please make sure you do not smoke cigarettes while on this.  I have included more information about the patch below.  Feel free to call with any questions or concerns at any time, at [phone].   Take care,  Dr. Shary Key [facility]  Ethinyl Estradiol; Norelgestromin Patches What is this medication? ETHINYL ESTRADIOL;NORELGESTROMIN (ETH in il es tra DYE ole; nor el JES troe min) prevents ovulation and pregnancy. It belongs to a group of medications called contraceptives. It is a combination of the hormones estrogen and progestin. This medicine may be used for other purposes; ask your health care provider or pharmacist if you have questions. COMMON BRAND NAME(S): Ortho Becky Sax, ZAFEMY What should I tell my care team before I take this medication? They need to know if you have or ever had any of these conditions: Abnormal vaginal bleeding Blood clots Blood vessel disease Breast, cervical, endometrial, ovarian, liver, or uterine cancer Diabetes Gallbladder disease Having surgery Heart disease or recent heart attack High blood pressure High cholesterol or triglycerides History of irregular heartbeat or heart valve problems Kidney disease Liver disease Lupus Migraine headaches Protein C or S deficiency Recently had a baby, miscarriage, or abortion Stroke Tobacco use An unusual or allergic reaction to estrogens, progestins, other medications, foods, dyes, or preservatives Pregnant or trying to get pregnant Breastfeeding How should I use this medication? This patch is applied to the skin. Follow the directions on the prescription label. Apply to clean, dry, healthy skin on the buttock, abdomen, upper outer arm or upper torso, in a place where it will not be rubbed by tight clothing. Do not use lotions or other cosmetics on the site where the patch will  go. Press the patch firmly in place for 10 seconds to ensure good contact with the skin. Change the patch every 7 days on the same day of the week for 3 weeks. You will then have a break from the patch for 1 week, after which you will apply a new patch. Do not use your medication more often than directed. Contact your care team about the use of this medication in children. Special care may be needed. This medication has been used in female children who have started having menstrual periods. A patient package insert for the product will be given with each prescription and refill. Read this sheet carefully each time. The sheet may change frequently. Overdosage: If you think you have taken too much of this medicine contact a poison control center or emergency room at once. NOTE: This medicine is only for you. Do not share this medicine with others. What if I miss a dose? You will need to replace your patch once a week as directed. If your patch is lost or falls off, contact your care team for advice. You may need to use another form of birth control if your patch has been off for more than 1 day. What may interact with this medication? Do not take this medication with the following: Dasabuvir; ombitasvir; paritaprevir; ritonavir Ombitasvir; paritaprevir; ritonavir This medication may also interact with the following: Acetaminophen Antibiotics or medications for infections, especially rifampin, rifabutin, rifapentine, penicillins, or tetracyclines Aprepitant or fosaprepitant Armodafinil Ascorbic acid (vitamin C) Barbiturate medications, such as phenobarbital or primidone Bosentan Certain antivirals for HIV or hepatitis Certain medications for cancer treatment Certain medications for cholesterol Certain medications for seizures,  such as carbamazepine, clobazam, felbamate, lamotrigine, oxcarbazepine, phenytoin, rufinamide, or topiramate Cyclosporine Dantrolene Elagolix Flibanserin Grapefruit  juice Lesinurad Medications for diabetes Medications to treat fungal infections, such as griseofulvin, miconazole, fluconazole, ketoconazole, itraconazole, posaconazole, or voriconazole Mifepristone Mitotane Modafinil Morphine Mycophenolate St. John's wort Tamoxifen Temazepam Theophylline or aminophylline Thyroid hormones Tizanidine Tranexamic acid Ulipristal Warfarin This list may not describe all possible interactions. Give your health care provider a list of all the medicines, herbs, non-prescription drugs, or dietary supplements you use. Also tell them if you smoke, drink alcohol, or use illegal drugs. Some items may interact with your medicine. What should I watch for while using this medication? Visit your care team for regular checks on your progress. You will need a regular breast and pelvic exam and Pap smear while on this medication. Use an additional method of contraception during the first cycle that you use this patch. If you have any reason to think you are pregnant, stop using this medication right away and contact your care team. If you are using this medication for hormone related problems, it may take several cycles of use to see improvement in your condition. Smoking tobacco increases the risk of getting a blood clot or having a stroke while you are taking this medication, especially if you are older than 35 years. This medication can make your body retain fluid, making your fingers, hands, or ankles swell. Your blood pressure can go up. Contact your care team if you feel you are retaining fluid. This medication can make you more sensitive to the sun. Keep out of the sun. If you cannot avoid being in the sun, wear protective clothing and sunscreen. Do not use sun lamps, tanning beds, or tanning booths. If you wear contact lenses and notice visual changes, or if the lenses begin to feel uncomfortable, consult your eye care specialist. Tenderness, swelling, or minor  bleeding of the gums may occur. Talk to your dentist if this happens. Brushing and flossing your teeth regularly may reduce the risk of side effects. Visit your dentist on a regular basis. Tell your dentist about any medications you are taking. If you are going to have elective surgery or an MRI, tell your care team that you are using this medication. You may need to remove the patch before the procedure. Using this medication does not protect you or your partner against HIV or other sexually transmitted infections (STIs). What side effects may I notice from receiving this medication? Side effects that you should report to your care team as soon as possible: Allergic reactions--skin rash, itching, hives, swelling of the face, lips, tongue, or throat Blood clot--pain, swelling, or warmth in the leg, shortness of breath, chest pain Gallbladder problems--severe stomach pain, nausea, vomiting, fever Increase in blood pressure Liver injury--right upper belly pain, loss of appetite, nausea, light-colored stool, dark yellow or brown urine, yellowing skin or eyes, unusual weakness or fatigue New or worsening migraines or headaches Stroke--sudden numbness or weakness of the face, arm, or leg, trouble speaking, confusion, trouble walking, loss of balance or coordination, dizziness, severe headache, change in vision Unusual vaginal discharge, itching, or odor Worsening mood, feelings of depression Side effects that usually do not require medical attention (report to your care team if they continue or are bothersome): Breast pain or tenderness Dark patches of skin on the face or other sun-exposed areas Irregular menstrual cycles or spotting Nausea Weight gain This list may not describe all possible side effects. Call your doctor for medical advice about  side effects. You may report side effects to FDA at 1-800-FDA-1088. Where should I keep my medication? Keep out of the reach of children and pets. Store  at room temperature between 15 and 30 degrees C (59 and 86 degrees F). Keep the patch in its pouch until time of use. Throw away any unused medication after the expiration date. Dispose of used patches properly. Since a used patch may still contain active hormones, fold the patch in half so that it sticks to itself prior to disposal. Throw away in a place where children or pets cannot reach. NOTE: This sheet is a summary. It may not cover all possible information. If you have questions about this medicine, talk to your doctor, pharmacist, or health care provider.  2023 Elsevier/Gold Standard (2020-07-17 00:00:00)

## 2022-07-10 NOTE — Progress Notes (Signed)
    SUBJECTIVE:   CHIEF COMPLAINT / HPI:   Patient presents to discuss contraception  Had an abortion 1 month ago and had bleeding for about 2 weeks after. Currently not bleeding.  Believes she wants the patch. Has tried Nexplanon, IUD, Nuva ring Denies new partner  LMP in November  Tested for STIs 1 month ago and negative   States she stopped smoking cigarettes Denies migraines with aura Denies history of DVTs   PERTINENT  PMH / PSH: Reviewed   OBJECTIVE:   BP 105/70   Pulse (!) 103   Temp 97.9 F (36.6 C)   Ht 5\' 3"  (1.6 m)   Wt 174 lb 6.4 oz (79.1 kg)   LMP 04/11/2022 (Approximate)   SpO2 99%   BMI 30.89 kg/m   Physical exam General: well appearing, NAD Cardiovascular: RRR, no murmurs Lungs: CTAB. Normal WOB Abdomen: soft, non-distended, non-tender Skin: warm, dry. No edema  ASSESSMENT/PLAN:   Birth control counseling Patient opted for the patch for birth control. Urine pregnancy test negative. Was previously smoking cigarettes but states she has quit. Understands that she can not smoke while on the patch. Denies migraines with auras and no history of DVT. Discussed how to properly use it and provided handout.     Shannon Randolph

## 2022-07-10 NOTE — Assessment & Plan Note (Signed)
Patient opted for the patch for birth control. Urine pregnancy test negative. Was previously smoking cigarettes but states she has quit. Understands that she can not smoke while on the patch. Denies migraines with auras and no history of DVT. Discussed how to properly use it and provided handout.

## 2022-08-16 DIAGNOSIS — H5213 Myopia, bilateral: Secondary | ICD-10-CM | POA: Diagnosis not present

## 2022-08-21 ENCOUNTER — Ambulatory Visit (INDEPENDENT_AMBULATORY_CARE_PROVIDER_SITE_OTHER): Payer: Medicaid Other | Admitting: Student

## 2022-08-21 ENCOUNTER — Other Ambulatory Visit (HOSPITAL_COMMUNITY)
Admission: RE | Admit: 2022-08-21 | Discharge: 2022-08-21 | Disposition: A | Discharge status: Home / Self Care | Payer: Medicaid Other | Source: Ambulatory Visit | Attending: Family Medicine | Admitting: Family Medicine

## 2022-08-21 VITALS — BP 98/58 | HR 98 | Wt 179.0 lb

## 2022-08-21 DIAGNOSIS — N939 Abnormal uterine and vaginal bleeding, unspecified: Secondary | ICD-10-CM | POA: Insufficient documentation

## 2022-08-21 DIAGNOSIS — N760 Acute vaginitis: Secondary | ICD-10-CM | POA: Insufficient documentation

## 2022-08-21 DIAGNOSIS — B9689 Other specified bacterial agents as the cause of diseases classified elsewhere: Secondary | ICD-10-CM | POA: Diagnosis not present

## 2022-08-21 LAB — POCT WET PREP (WET MOUNT)
Clue Cells Wet Prep Whiff POC: POSITIVE
Trichomonas Wet Prep HPF POC: ABSENT

## 2022-08-21 MED ORDER — METRONIDAZOLE 0.75 % VA GEL: 1.0000 | Freq: Every day | VAGINAL | 1 refills | Status: AC

## 2022-08-21 NOTE — Assessment & Plan Note (Signed)
32 year old female here with around 2 weeks of vaginal bleeding s/p removal of estrogen hormonal patch. Bleeding is likely due to withdrawal bleeding.  No abnormalities were appreciated on exam today. Did collect cervicovaginal swab to screen for GC, chlamydia, BV, trichomonas and candidiasis to rule out infection as a possible source of bleeding and/or vaginal odor. Patient denies any sexual activity, so I encouraged patient to continue using estrogen patch as contraception and to hopefully aid in regulating her irregular uterine bleeding..  I educated her on appropriate use of the patch, applying a patch on the same day of the week for 3 weeks ago in a row followed by skipping the patch on week 4. Return precautions discussed should patient develop fever, chills, dizziness, abdominal pain, persistent vaginal bleeding.

## 2022-08-21 NOTE — Assessment & Plan Note (Signed)
POC wet prep positive for whiff test as well as clue cells Plan to treat with MetroGel (patient says that metronidazole has not worked well for her in the past and she prefers the gel. Called patient to discuss results as she had already left clinic before wet prep returned

## 2022-08-21 NOTE — Patient Instructions (Addendum)
Great meeting you today responded.  Your bleeding is likely due to what we call "withdrawal bleeding" when there are changes in hormone shifts in the body.  Follow these instructions for your birth control: Apply a new contraceptive patch to your body each week -- on the same day of the week -- for three weeks in a row. Apply each new patch to a different area of skin to avoid irritation.  Skip the patch on the week 4. Don't apply a new patch during the fourth week, when you'll have your period. After the fourth week ends, use a new patch and apply it on the same day of the week that you applied the patch in the prior week  We tested you for infection today.  I will call you if anything is abnormal.  If you develop fever, chills, severe abdominal pain, heavy bleeding or dizziness, please seek more urgent care.  I will be in touch with you, Dr. Owens Shark

## 2022-08-21 NOTE — Progress Notes (Cosign Needed Addendum)
    SUBJECTIVE:   CHIEF COMPLAINT / HPI:   Shannon Randolph is a 32 year-old female here for vaginal bleeding and vaginal odor.  She used her estrogen patch for contraception appropriately for 7 days, remove the patch and then did not apply a new one. The bleeding started 2 days later.  Bleeding then went away, but returned a week later with a menstrual-like heaviness.  She used the patch 1 time and has not used it since then. Denies any side effects other than abnormal bleeding.    She describes the vaginal odor smell as "ammonia-like" and she is very worried about the smell. Denies use of tampons or other objects in the vagina. No fevers, dizziness, chills.  Denies any abdominal pain.  She denies any sexual activity.  Of note, patient had an elective termination of pregnancy on 05/29/2022 and was then seen here at [facility] clinic with brown vaginal bleeding and vaginal pain which was treated with doxycycline 100 mg BID x 10 days and metronidazole 500 mg BID x 10 days.  PERTINENT  PMH / PSH:   OBJECTIVE:   BP (!) 98/58   Pulse 98   Wt 179 lb (81.2 kg)   LMP 08/08/2022   SpO2 98%   BMI 31.71 kg/m   General: Well-appearing, no distress Respiratory: Normal work of breathing on room air GU: Page, CMA present as chaperone for exam.  External vulva and vagina nonerythematous, without any obvious lesions or rash.  Mild to moderate amount of dark-colored blood pooling in the posterior fornix.  Normal ruggae of vaginal walls.  No notable cervical abnormalities.  Cervical os is closed.  There is no cervical motion tenderness, masses or gross abnormalities appreciated during bimanual exam.   ASSESSMENT/PLAN:   Vaginal bleeding 32 year old female here with around 2 weeks of vaginal bleeding s/p removal of estrogen hormonal patch. Bleeding is likely due to withdrawal bleeding.  No abnormalities were appreciated on exam today. Did collect cervicovaginal swab to screen for GC, chlamydia, BV, trichomonas  and candidiasis to rule out infection as a possible source of bleeding and/or vaginal odor. Patient denies any sexual activity, so I encouraged patient to continue using estrogen patch as contraception and to hopefully aid in regulating her irregular uterine bleeding..  I educated her on appropriate use of the patch, applying a patch on the same day of the week for 3 weeks ago in a row followed by skipping the patch on week 4. Return precautions discussed should patient develop fever, chills, dizziness, abdominal pain, persistent vaginal bleeding.  Bacterial vaginosis POC wet prep positive for whiff test as well as clue cells Plan to treat with MetroGel (patient says that metronidazole has not worked well for her in the past and she prefers the gel. Called patient to discuss results as she had already left clinic before wet prep returned   Orvis Brill, [place]

## 2022-08-21 NOTE — Addendum Note (Signed)
Addended by: Orvis Brill on: 08/21/2022 03:25 PM   Modules accepted: Orders

## 2022-08-22 LAB — CERVICOVAGINAL ANCILLARY ONLY
Chlamydia: NEGATIVE
Comment: NEGATIVE
Comment: NORMAL
Neisseria Gonorrhea: NEGATIVE

## 2022-09-03 ENCOUNTER — Ambulatory Visit (INDEPENDENT_AMBULATORY_CARE_PROVIDER_SITE_OTHER): Payer: Medicaid Other

## 2022-09-03 DIAGNOSIS — Z111 Encounter for screening for respiratory tuberculosis: Secondary | ICD-10-CM

## 2022-09-04 NOTE — Progress Notes (Signed)
Patient is here for a PPD placement.  PPD placed in left forearm @ 1020 am.  Patient will return 09/05/2022 to have PPD read. Veronda Prude, RN

## 2022-09-05 ENCOUNTER — Ambulatory Visit (INDEPENDENT_AMBULATORY_CARE_PROVIDER_SITE_OTHER): Payer: Medicaid Other

## 2022-09-05 DIAGNOSIS — Z111 Encounter for screening for respiratory tuberculosis: Secondary | ICD-10-CM

## 2022-09-05 LAB — TB SKIN TEST
Induration: 0 mm
TB Skin Test: NEGATIVE

## 2022-09-05 NOTE — Progress Notes (Signed)
Patient is here for a PPD read.  It was placed on 09/03/2022 in the left forearm @ 1020 am.    PPD RESULTS:  Result: negative Induration: 0 mm  Letter created and given to patient for documentation purposes. Veronda Prude, RN

## 2023-01-20 NOTE — Progress Notes (Unsigned)
    SUBJECTIVE:   CHIEF COMPLAINT / HPI: vaginal discharge/odor  Patient presents to the clinic today with vaginal discharge and odor for 3 weeks. Patient predominantly takes baths and no showers,so she relates her symptoms to this. She reports that she normally soaks her underwear. Denies any vaginal bleeding and has been on the patch weekly for contraception. LMP 12/24/22 and last time she had sexual intercourse was one month ago as well.   PERTINENT  PMH / PSH: abnormal pap smear, BV  OBJECTIVE:   BP 110/70   Pulse 89   Ht 5\' 3"  (1.6 m)   Wt 167 lb (75.8 kg)   LMP 12/24/2022   SpO2 99%   BMI 29.58 kg/m   General: NAD, awake and alert HEENT: Normocephalic, atraumatic. Conjunctiva normal. No nasal discharge. Cardiovascular: RRR. No M/R/G Respiratory: CTAB, normal WOB on RA. No wheezing, crackles, rhonchi, or diminished breath sounds. Abdomen: Soft, non-tender, non-distended. Bowel sounds normoactive Extremities: No BLE edema, no deformities or significant joint findings. Skin: Warm and dry. Neuro: A&Ox3. No focal neurological deficits.  F GU: Normally developed genitalia with no external lesions or eruptions. Vagina and cervix show no lesions, inflammation, discharge or tenderness.  ASSESSMENT/PLAN:   Vaginal odor 3 weeks of vaginal odor and discharge. No bleeding and has not had sexual intercourse in about one month. - STD screening - Wet prep positive for BV - Treated with Metrogel daily for 5 days since she is unable to tolerate the tablet, prescribed one tube with a refill as patient states she usually requires two in order to improve  Fortunato Curling, DO [facility]

## 2023-01-21 ENCOUNTER — Encounter: Payer: Self-pay | Admitting: Family Medicine

## 2023-01-21 ENCOUNTER — Ambulatory Visit: Payer: Medicaid Other | Admitting: Family Medicine

## 2023-01-21 ENCOUNTER — Other Ambulatory Visit (HOSPITAL_COMMUNITY)
Admission: RE | Admit: 2023-01-21 | Discharge: 2023-01-21 | Disposition: A | Discharge status: Home / Self Care | Payer: Medicaid Other | Source: Ambulatory Visit | Attending: Family Medicine | Admitting: Family Medicine

## 2023-01-21 VITALS — BP 110/70 | HR 89 | Ht 63.0 in | Wt 167.0 lb

## 2023-01-21 DIAGNOSIS — B9689 Other specified bacterial agents as the cause of diseases classified elsewhere: Secondary | ICD-10-CM

## 2023-01-21 DIAGNOSIS — Z113 Encounter for screening for infections with a predominantly sexual mode of transmission: Secondary | ICD-10-CM

## 2023-01-21 DIAGNOSIS — N760 Acute vaginitis: Secondary | ICD-10-CM

## 2023-01-21 DIAGNOSIS — N898 Other specified noninflammatory disorders of vagina: Secondary | ICD-10-CM | POA: Diagnosis not present

## 2023-01-21 LAB — POCT WET PREP (WET MOUNT)
Clue Cells Wet Prep Whiff POC: POSITIVE
Trichomonas Wet Prep HPF POC: ABSENT

## 2023-01-21 MED ORDER — METRONIDAZOLE 0.75 % VA GEL: 1.0000 | Freq: Every day | VAGINAL | 1 refills | Status: DC

## 2023-01-21 NOTE — Patient Instructions (Addendum)
It was great to see you today! Thank you for choosing [facility] for your primary care. Shannon Randolph was seen for vaginal odor and discharge.  Today we addressed: STD screening and wet prep for BV Treated with Metrogel for BV apply daily for 5 days.  If you haven't already, sign up for My Chart to have easy access to your labs results, and communication with your primary care physician.  We are checking some labs today. If they are abnormal, I will call you. If they are normal, I will send you a MyChart message (if it is active) or a letter in the mail. If you do not hear about your labs in the next 2 weeks, please call the office.  You should return to our clinic No follow-ups on file. Please arrive 15 minutes before your appointment to ensure smooth check in process.  We appreciate your efforts in making this happen.  Thank you for allowing me to participate in your care, Fortunato Curling, DO 01/21/2023, 9:32 AM PGY-1, [facility]

## 2023-01-21 NOTE — Assessment & Plan Note (Addendum)
3 weeks of vaginal odor and discharge. No bleeding and has not had sexual intercourse in about one month. - STD screening - Wet prep positive for BV - Treated with Metrogel daily for 5 days since she is unable to tolerate the tablet, prescribed one tube with a refill as patient states she usually requires two in order to improve

## 2023-01-22 ENCOUNTER — Telehealth: Payer: Self-pay | Admitting: Family Medicine

## 2023-01-22 LAB — HIV ANTIBODY (ROUTINE TESTING W REFLEX): HIV Screen 4th Generation wRfx: NONREACTIVE

## 2023-01-22 LAB — RPR: RPR Ser Ql: NONREACTIVE

## 2023-01-22 NOTE — Telephone Encounter (Signed)
Called patient to update regarding further STI negative results.  Patient verbalized understanding no further questions at this time.  Patient will continue MetroGel as discussed yesterday in clinic with Dr. Fatima Blank.

## 2023-01-23 LAB — CERVICOVAGINAL ANCILLARY ONLY
Bacterial Vaginitis (gardnerella): POSITIVE — AB
Candida Glabrata: NEGATIVE
Candida Vaginitis: NEGATIVE
Chlamydia: NEGATIVE
Comment: NEGATIVE
Comment: NEGATIVE
Comment: NEGATIVE
Comment: NEGATIVE
Comment: NEGATIVE
Comment: NORMAL
Neisseria Gonorrhea: NEGATIVE
Trichomonas: NEGATIVE

## 2023-05-05 DIAGNOSIS — N760 Acute vaginitis: Secondary | ICD-10-CM | POA: Diagnosis not present

## 2023-05-05 DIAGNOSIS — B9689 Other specified bacterial agents as the cause of diseases classified elsewhere: Secondary | ICD-10-CM | POA: Diagnosis not present

## 2023-07-01 ENCOUNTER — Encounter: Payer: Self-pay | Admitting: Family Medicine

## 2023-07-01 ENCOUNTER — Ambulatory Visit (INDEPENDENT_AMBULATORY_CARE_PROVIDER_SITE_OTHER): Payer: Medicaid Other | Admitting: Family Medicine

## 2023-07-01 ENCOUNTER — Other Ambulatory Visit (HOSPITAL_COMMUNITY)
Admission: RE | Admit: 2023-07-01 | Discharge: 2023-07-01 | Disposition: A | Discharge status: Home / Self Care | Payer: Medicaid Other | Source: Ambulatory Visit | Attending: Family Medicine | Admitting: Family Medicine

## 2023-07-01 VITALS — BP 102/67 | HR 106 | Wt 174.4 lb

## 2023-07-01 DIAGNOSIS — Z113 Encounter for screening for infections with a predominantly sexual mode of transmission: Secondary | ICD-10-CM | POA: Diagnosis not present

## 2023-07-01 DIAGNOSIS — Z124 Encounter for screening for malignant neoplasm of cervix: Secondary | ICD-10-CM | POA: Insufficient documentation

## 2023-07-01 DIAGNOSIS — N898 Other specified noninflammatory disorders of vagina: Secondary | ICD-10-CM | POA: Diagnosis not present

## 2023-07-01 DIAGNOSIS — R8290 Unspecified abnormal findings in urine: Secondary | ICD-10-CM | POA: Diagnosis not present

## 2023-07-01 DIAGNOSIS — Z1151 Encounter for screening for human papillomavirus (HPV): Secondary | ICD-10-CM | POA: Diagnosis not present

## 2023-07-01 LAB — POCT URINALYSIS DIP (MANUAL ENTRY)
Bilirubin, UA: NEGATIVE
Glucose, UA: NEGATIVE mg/dL
Ketones, POC UA: NEGATIVE mg/dL
Nitrite, UA: POSITIVE — AB
Protein Ur, POC: NEGATIVE mg/dL
Spec Grav, UA: 1.02 (ref 1.010–1.025)
Urobilinogen, UA: 0.2 U/dL
pH, UA: 5.5 (ref 5.0–8.0)

## 2023-07-01 LAB — POCT UA - MICROSCOPIC ONLY

## 2023-07-01 MED ORDER — METRONIDAZOLE 0.75 % EX GEL: 1.0000 | Freq: Two times a day (BID) | CUTANEOUS | 1 refills | Status: AC

## 2023-07-01 MED ORDER — METRONIDAZOLE 0.75 % EX GEL: 1.0000 | Freq: Two times a day (BID) | CUTANEOUS | 0 refills | Status: DC

## 2023-07-01 NOTE — Progress Notes (Signed)
   SUBJECTIVE:   CHIEF COMPLAINT / HPI:   Vaginal discomfort - went to ED few weeks ago for UTI. Took antibiotic but not consistent with taking on schedule did end up finishing.  - some urinary odor and chemical smell to vaginal discharge but denies dysuria, urinary frequency and urgency. - denies hematuria, suprapubic pain, N/V - take few baths per day - h/o recurrent BV - unable to tolerate oral flagyl. - has not had sex since last vaginal testing.  OBJECTIVE:   BP 102/67   Pulse (!) 106   Wt 174 lb 6.4 oz (79.1 kg)   SpO2 100%   BMI 30.89 kg/m   Gen: well appearing, in NAD GYN:  External genitalia within normal limits.  Vaginal mucosa pink, moist, normal rugae.  Nonfriable cervix without lesions, no bleeding noted on speculum exam. Moderate amount of thin white discharge noted. Bimanual exam revealed normal, nongravid uterus.  No cervical motion tenderness. No adnexal masses bilaterally.     ASSESSMENT/PLAN:   Vaginal odor Obtained PAP, send out wet prep today as unable to do POC. Given symptoms similar to previous and documentation of recurrent BV, will send metrogel treatment as well as ppx. Handout provided on BV prevention.      Caro Laroche, DO

## 2023-07-01 NOTE — Assessment & Plan Note (Signed)
Obtained PAP, send out wet prep today as unable to do POC. Given symptoms similar to previous and documentation of recurrent BV, will send metrogel treatment as well as ppx. Handout provided on BV prevention.

## 2023-07-01 NOTE — Patient Instructions (Signed)
It was great to see you!  Our plans for today:  - Take the metronidazole gel as prescribed. We will do an extended course to help prevent recurrence.  We are checking some labs today, we will release these results to your MyChart.  Take care and seek immediate care sooner if you develop any concerns.   Dr. Linwood Dibbles   GO WHITE: Soap: UNSCENTED Dove (white box light green writing) Laundry detergent (underwear)- Dreft or [employer] unscented WHITE 100% cotton panties (NOT just cotton crouch) Sanitary napkin/panty liners: UNSCENTED.  If it doesn't SAY unscented it can have a scent/perfume    NO PERFUMES OR LOTIONS OR POTIONS in the vulvar area (may use regular KY) Condoms: hypoallergenic only. Non dyed (no color) Toilet papers: white only Wash clothes: use a separate wash cloth. WHITE.  Washed in Dreft.

## 2023-07-04 LAB — CYTOLOGY - PAP
Chlamydia: NEGATIVE
Comment: NEGATIVE
Comment: NEGATIVE
Comment: NEGATIVE
Comment: NORMAL
Diagnosis: NEGATIVE
High risk HPV: NEGATIVE
Neisseria Gonorrhea: NEGATIVE
Trichomonas: NEGATIVE

## 2023-07-05 ENCOUNTER — Encounter: Payer: Self-pay | Admitting: Family Medicine

## 2023-08-16 ENCOUNTER — Encounter: Payer: Self-pay | Admitting: Family Medicine

## 2023-08-16 NOTE — Telephone Encounter (Signed)
 Called patient to discuss concern further. She states that when metrogel rx was filled in February, this was for topical gel not vaginal.   She realized this today when she tried to refill the gel and was told that this was for topical use.   She reports that the tube only lasted for BID treatment for about 3-4 days.   She reports odor is present, however, not as strong as before. She also reports increase in vaginal discharge.   She is asking about potential side effects/concerns with inserting the topical metrogel that was not intended for vaginal use.   Will forward to Dr. Linwood Dibbles. Please advise.   Veronda Prude, RN

## 2023-08-18 MED ORDER — METRONIDAZOLE 0.75 % VA GEL: VAGINAL | 1 refills | Status: DC

## 2023-08-18 NOTE — Telephone Encounter (Signed)
 Medication is the same (topical vs vaginal) so should not have other side effects not experienced with vaginal application. I sent new refill in. If she is still having symptoms after this round of treatment, we should consider treatment with different antibiotic

## 2023-09-30 ENCOUNTER — Encounter: Payer: Self-pay | Admitting: Family Medicine

## 2023-11-30 DIAGNOSIS — Z79899 Other long term (current) drug therapy: Secondary | ICD-10-CM | POA: Diagnosis not present

## 2023-11-30 DIAGNOSIS — Z791 Long term (current) use of non-steroidal anti-inflammatories (NSAID): Secondary | ICD-10-CM | POA: Diagnosis not present

## 2023-11-30 DIAGNOSIS — F17210 Nicotine dependence, cigarettes, uncomplicated: Secondary | ICD-10-CM | POA: Diagnosis not present

## 2023-11-30 DIAGNOSIS — B9689 Other specified bacterial agents as the cause of diseases classified elsewhere: Secondary | ICD-10-CM | POA: Diagnosis not present

## 2023-11-30 DIAGNOSIS — N760 Acute vaginitis: Secondary | ICD-10-CM | POA: Diagnosis not present

## 2024-02-21 ENCOUNTER — Other Ambulatory Visit (HOSPITAL_COMMUNITY)
Admission: RE | Admit: 2024-02-21 | Discharge: 2024-02-21 | Disposition: A | Discharge status: Home / Self Care | Source: Ambulatory Visit | Attending: Family Medicine | Admitting: Family Medicine

## 2024-02-21 ENCOUNTER — Ambulatory Visit

## 2024-02-21 VITALS — BP 101/68 | HR 103 | Temp 98.1°F | Ht 63.0 in | Wt 169.4 lb

## 2024-02-21 DIAGNOSIS — N898 Other specified noninflammatory disorders of vagina: Secondary | ICD-10-CM

## 2024-02-21 DIAGNOSIS — Z23 Encounter for immunization: Secondary | ICD-10-CM

## 2024-02-21 LAB — POCT URINALYSIS DIP (MANUAL ENTRY)
Bilirubin, UA: NEGATIVE
Glucose, UA: NEGATIVE mg/dL
Ketones, POC UA: NEGATIVE mg/dL
Leukocytes, UA: NEGATIVE
Nitrite, UA: NEGATIVE
Protein Ur, POC: NEGATIVE mg/dL
Spec Grav, UA: 1.025 (ref 1.010–1.025)
Urobilinogen, UA: 0.2 U/dL
pH, UA: 5.5 (ref 5.0–8.0)

## 2024-02-21 LAB — POCT UA - MICROSCOPIC ONLY: WBC, Ur, HPF, POC: NONE SEEN (ref 0–5)

## 2024-02-21 LAB — POCT WET PREP (WET MOUNT)
Clue Cells Wet Prep Whiff POC: POSITIVE
Trichomonas Wet Prep HPF POC: ABSENT

## 2024-02-21 LAB — POCT URINE PREGNANCY: Preg Test, Ur: NEGATIVE

## 2024-02-21 MED ORDER — NORETHINDRONE 0.35 MG PO TABS: 1.0000 | ORAL_TABLET | Freq: Every day | ORAL | 11 refills | Status: AC

## 2024-02-21 MED ORDER — METRONIDAZOLE 500 MG PO TABS: 500.0000 mg | ORAL_TABLET | Freq: Two times a day (BID) | ORAL | 0 refills | Status: DC

## 2024-02-21 MED ORDER — METRONIDAZOLE 0.75 % VA GEL: 1.0000 | Freq: Every day | VAGINAL | 0 refills | Status: AC

## 2024-02-21 MED ORDER — NORGESTIM-ETH ESTRAD TRIPHASIC 0.18/0.215/0.25 MG-25 MCG PO TABS: 1.0000 | ORAL_TABLET | Freq: Every day | ORAL | 11 refills | Status: DC

## 2024-02-21 NOTE — Patient Instructions (Signed)
 Thank you for visiting clinic today and allowing us  to participate in your care!  We collected some work up today and will let you know of the results.   We discussed starting oral birth control pills. This has been sent to your pharmacy.   Please schedule an appointment with your PCP as needed.   Reach out any time with any questions or concerns you may have - we are here for you!  Damien Cassis, MD [facility] [phone]

## 2024-02-21 NOTE — Progress Notes (Signed)
    SUBJECTIVE:   CHIEF COMPLAINT / HPI:   Vaginal discharge -1 week increased whitish discharge -Strong odor when she urinates -No blood in urine -Sexually active, no current BC, interested in [facility]  OBJECTIVE:   BP 101/68   Pulse (!) 103   Temp 98.1 F (36.7 C)   Ht 5' 3 (1.6 m)   Wt 169 lb 6.4 oz (76.8 kg)   LMP 02/09/2024   SpO2 98%   BMI 30.01 kg/m   General: Well-appearing. Resting comfortably in room. Pulm: Breathing comfortably on room air. No increased WOB. GU: Normal appearing external genitalia without lesions. Normal appearing cervix and vaginal rugae. Moderate amount of white discharge in canal. GU exam chaperoned by CMA. Skin:  Warm, dry. Psych: Pleasant and appropriate.    ASSESSMENT/PLAN:   Assessment & Plan Vaginal discharge Patient prefers routine STI/STD testing without blood work. UTD on pap, last normal in Feb 2025.  - G/C/Trich - Wet Prep - UA - Urine preg - OCP ordered per patient preference   Annual flu vaccine administered today.  RTC as needed.  Damien Cassis, MD [facility]  Addendum 1248: -Spoke with patient regarding OCP. Patient confirmed she does still actively smoke cigarettes. Of various contraceptive methods, patient prefers oral pill method. Discussed progesterone only pill, POP. Rx sent for norethidone. Discontinued triphasic norgestim. Patient expressed understanding. Patient pharmacy made aware of change. -Also spoke with patient regarding results suggestive of BV. Patient prefers vaginal gel treatment. Rx for metronidazole  vaginal gel placed. Discontinued oral metronidazole . Patient pharmacy made aware of change. Patient expressed understanding.

## 2024-02-24 ENCOUNTER — Ambulatory Visit: Payer: Self-pay | Admitting: Family Medicine

## 2024-02-24 LAB — CERVICOVAGINAL ANCILLARY ONLY
Chlamydia: NEGATIVE
Comment: NEGATIVE
Comment: NEGATIVE
Comment: NORMAL
Neisseria Gonorrhea: NEGATIVE
Trichomonas: NEGATIVE

## 2024-03-15 DIAGNOSIS — H6691 Otitis media, unspecified, right ear: Secondary | ICD-10-CM | POA: Diagnosis not present

## 2024-03-15 DIAGNOSIS — F17210 Nicotine dependence, cigarettes, uncomplicated: Secondary | ICD-10-CM | POA: Diagnosis not present

## 2024-03-26 DIAGNOSIS — K649 Unspecified hemorrhoids: Secondary | ICD-10-CM | POA: Diagnosis not present

## 2024-03-26 DIAGNOSIS — R21 Rash and other nonspecific skin eruption: Secondary | ICD-10-CM | POA: Diagnosis not present

## 2024-03-26 DIAGNOSIS — B3731 Acute candidiasis of vulva and vagina: Secondary | ICD-10-CM | POA: Diagnosis not present

## 2024-03-26 DIAGNOSIS — N760 Acute vaginitis: Secondary | ICD-10-CM | POA: Diagnosis not present

## 2024-03-26 DIAGNOSIS — B9689 Other specified bacterial agents as the cause of diseases classified elsewhere: Secondary | ICD-10-CM | POA: Diagnosis not present

## 2024-03-26 DIAGNOSIS — F17210 Nicotine dependence, cigarettes, uncomplicated: Secondary | ICD-10-CM | POA: Diagnosis not present

## 2024-03-26 DIAGNOSIS — Z791 Long term (current) use of non-steroidal anti-inflammatories (NSAID): Secondary | ICD-10-CM | POA: Diagnosis not present

## 2024-03-26 DIAGNOSIS — Z79899 Other long term (current) drug therapy: Secondary | ICD-10-CM | POA: Diagnosis not present

## 2024-04-19 DIAGNOSIS — R519 Headache, unspecified: Secondary | ICD-10-CM | POA: Diagnosis not present

## 2024-05-15 DIAGNOSIS — F17210 Nicotine dependence, cigarettes, uncomplicated: Secondary | ICD-10-CM | POA: Diagnosis not present

## 2024-05-15 DIAGNOSIS — R000 Tachycardia, unspecified: Secondary | ICD-10-CM | POA: Diagnosis not present

## 2024-05-15 DIAGNOSIS — R079 Chest pain, unspecified: Secondary | ICD-10-CM | POA: Diagnosis not present

## 2024-05-18 DIAGNOSIS — F17210 Nicotine dependence, cigarettes, uncomplicated: Secondary | ICD-10-CM | POA: Diagnosis not present

## 2024-05-18 DIAGNOSIS — N390 Urinary tract infection, site not specified: Secondary | ICD-10-CM | POA: Diagnosis not present
